# Patient Record
Sex: Female | Born: 1955 | Race: White | Hispanic: No
Health system: Southern US, Academic
[De-identification: ages and names within clinical notes are randomized; demographics above are authoritative.]

## PROBLEM LIST (undated history)

## (undated) ENCOUNTER — Ambulatory Visit

## (undated) ENCOUNTER — Ambulatory Visit: Payer: MEDICARE

## (undated) ENCOUNTER — Encounter

## (undated) ENCOUNTER — Inpatient Hospital Stay

## (undated) ENCOUNTER — Ambulatory Visit: Attending: Pharmacist | Primary: Pharmacist

## (undated) ENCOUNTER — Encounter: Attending: Family | Primary: Family

## (undated) ENCOUNTER — Telehealth: Payer: MEDICARE | Attending: Adult Health | Primary: Adult Health

## (undated) ENCOUNTER — Telehealth

## (undated) ENCOUNTER — Ambulatory Visit: Payer: MEDICARE | Attending: Physician Assistant | Primary: Physician Assistant

## (undated) ENCOUNTER — Encounter: Attending: Adult Health | Primary: Adult Health

## (undated) ENCOUNTER — Telehealth: Attending: Clinical | Primary: Clinical

## (undated) ENCOUNTER — Ambulatory Visit: Payer: MEDICARE | Attending: Adult Health | Primary: Adult Health

## (undated) ENCOUNTER — Ambulatory Visit: Payer: MEDICARE | Attending: Family | Primary: Family

## (undated) ENCOUNTER — Ambulatory Visit
Payer: MEDICARE | Attending: Rehabilitative and Restorative Service Providers" | Primary: Rehabilitative and Restorative Service Providers"

## (undated) ENCOUNTER — Ambulatory Visit: Payer: MEDICARE | Attending: Registered Nurse | Primary: Registered Nurse

## (undated) ENCOUNTER — Telehealth: Attending: Internal Medicine | Primary: Internal Medicine

## (undated) ENCOUNTER — Ambulatory Visit
Attending: Student in an Organized Health Care Education/Training Program | Primary: Student in an Organized Health Care Education/Training Program

## (undated) ENCOUNTER — Encounter: Attending: Nurse Practitioner | Primary: Nurse Practitioner

## (undated) ENCOUNTER — Encounter: Attending: Diagnostic Radiology | Primary: Diagnostic Radiology

## (undated) ENCOUNTER — Ambulatory Visit: Payer: MEDICARE | Attending: Speech-Language Pathologist | Primary: Speech-Language Pathologist

## (undated) ENCOUNTER — Telehealth: Attending: Family | Primary: Family

## (undated) DIAGNOSIS — F32A Depression, unspecified: Secondary | ICD-10-CM

## (undated) DIAGNOSIS — E785 Hyperlipidemia, unspecified: Secondary | ICD-10-CM

## (undated) DIAGNOSIS — F419 Anxiety disorder, unspecified: Secondary | ICD-10-CM

## (undated) DIAGNOSIS — Z87442 Personal history of urinary calculi: Secondary | ICD-10-CM

## (undated) DIAGNOSIS — R0600 Dyspnea, unspecified: Secondary | ICD-10-CM

## (undated) DIAGNOSIS — F4310 Post-traumatic stress disorder, unspecified: Secondary | ICD-10-CM

## (undated) DIAGNOSIS — J45909 Unspecified asthma, uncomplicated: Secondary | ICD-10-CM

## (undated) DIAGNOSIS — G4730 Sleep apnea, unspecified: Secondary | ICD-10-CM

## (undated) DIAGNOSIS — E119 Type 2 diabetes mellitus without complications: Secondary | ICD-10-CM

## (undated) DIAGNOSIS — R0609 Other forms of dyspnea: Secondary | ICD-10-CM

## (undated) DIAGNOSIS — I2510 Atherosclerotic heart disease of native coronary artery without angina pectoris: Secondary | ICD-10-CM

## (undated) DIAGNOSIS — M797 Fibromyalgia: Secondary | ICD-10-CM

## (undated) DIAGNOSIS — I5189 Other ill-defined heart diseases: Secondary | ICD-10-CM

## (undated) DIAGNOSIS — Z9981 Dependence on supplemental oxygen: Secondary | ICD-10-CM

## (undated) DIAGNOSIS — K219 Gastro-esophageal reflux disease without esophagitis: Secondary | ICD-10-CM

## (undated) DIAGNOSIS — I10 Essential (primary) hypertension: Secondary | ICD-10-CM

## (undated) HISTORY — PX: CARDIAC SURGERY: SHX584

## (undated) HISTORY — PX: CHOLECYSTECTOMY: SHX55

## (undated) HISTORY — PX: TONSILLECTOMY: SUR1361

## (undated) HISTORY — PX: ABDOMINAL HYSTERECTOMY: SHX81

## (undated) MED ORDER — TRAMADOL 50 MG TABLET: 0 days

## (undated) MED ORDER — MELOXICAM 15 MG TABLET
0 days
Start: ? — End: 2020-03-29

## (undated) MED ORDER — PREGABALIN 25 MG CAPSULE: Freq: Two times a day (BID) | ORAL | 0 days

## (undated) MED ORDER — PREDNISONE 10 MG TABLETS IN A DOSE PACK: 0 days

---
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
## 1981-10-14 HISTORY — PX: TONSILLECTOMY: SUR1361

## 2001-10-14 HISTORY — PX: MOUTH SURGERY: SHX715

## 2003-10-15 DIAGNOSIS — I219 Acute myocardial infarction, unspecified: Secondary | ICD-10-CM

## 2003-10-15 HISTORY — DX: Acute myocardial infarction, unspecified: I21.9

## 2003-10-15 HISTORY — PX: CARDIAC SURGERY: SHX584

## 2003-10-15 HISTORY — PX: CARDIAC CATHETERIZATION: SHX172

## 2006-10-14 DIAGNOSIS — I219 Acute myocardial infarction, unspecified: Secondary | ICD-10-CM | POA: Insufficient documentation

## 2010-09-15 ENCOUNTER — Observation Stay: Payer: Self-pay | Admitting: Internal Medicine

## 2010-09-15 ENCOUNTER — Ambulatory Visit: Payer: Self-pay | Admitting: Cardiovascular Disease

## 2012-04-07 ENCOUNTER — Ambulatory Visit: Payer: Self-pay

## 2012-08-07 DIAGNOSIS — R079 Chest pain, unspecified: Secondary | ICD-10-CM | POA: Insufficient documentation

## 2014-04-06 ENCOUNTER — Observation Stay: Payer: Self-pay | Admitting: Internal Medicine

## 2014-04-06 DIAGNOSIS — E785 Hyperlipidemia, unspecified: Secondary | ICD-10-CM

## 2014-04-06 DIAGNOSIS — I10 Essential (primary) hypertension: Secondary | ICD-10-CM

## 2014-04-06 DIAGNOSIS — R0789 Other chest pain: Secondary | ICD-10-CM

## 2014-04-06 LAB — BASIC METABOLIC PANEL
Anion Gap: 5 — ABNORMAL LOW (ref 7–16)
BUN: 14 mg/dL (ref 7–18)
CO2: 28 mmol/L (ref 21–32)
CREATININE: 0.67 mg/dL (ref 0.60–1.30)
Calcium, Total: 8.7 mg/dL (ref 8.5–10.1)
Chloride: 106 mmol/L (ref 98–107)
EGFR (African American): >60
GLUCOSE: 90 mg/dL (ref 65–99)
Osmolality: 278 (ref 275–301)
Potassium: 4.5 mmol/L (ref 3.5–5.1)
Sodium: 139 mmol/L (ref 136–145)

## 2014-04-06 LAB — CBC
HCT: 39.9 % (ref 35.0–47.0)
HGB: 12.7 g/dL (ref 12.0–16.0)
MCH: 28.5 pg (ref 26.0–34.0)
MCHC: 31.8 g/dL — ABNORMAL LOW (ref 32.0–36.0)
MCV: 90 fL (ref 80–100)
Platelet: 248 10*3/uL (ref 150–440)
RBC: 4.46 10*6/uL (ref 3.80–5.20)
RDW: 14.3 % (ref 11.5–14.5)
WBC: 7.9 10*3/uL (ref 3.6–11.0)

## 2014-04-06 LAB — URINALYSIS, COMPLETE
BILIRUBIN, UR: NEGATIVE
Bacteria: NONE SEEN
GLUCOSE, UR: NEGATIVE mg/dL (ref 0–75)
KETONE: NEGATIVE
Leukocyte Esterase: NEGATIVE
Nitrite: NEGATIVE
Ph: 6 (ref 4.5–8.0)
Protein: NEGATIVE
Specific Gravity: 1.019 (ref 1.003–1.030)
Squamous Epithelial: 1

## 2014-04-06 LAB — CK TOTAL AND CKMB (NOT AT ARMC)
CK, Total: 30 U/L
CK-MB: 1 ng/mL (ref 0.5–3.6)

## 2014-04-06 LAB — TROPONIN I

## 2014-04-07 LAB — LIPID PANEL
CHOLESTEROL: 216 mg/dL — AB (ref 0–200)
HDL: 31 mg/dL — AB (ref 40–60)
Ldl Cholesterol, Calc: 152 mg/dL — ABNORMAL HIGH (ref 0–100)
Triglycerides: 163 mg/dL (ref 0–200)
VLDL Cholesterol, Calc: 33 mg/dL (ref 5–40)

## 2014-04-07 LAB — CBC WITH DIFFERENTIAL/PLATELET
Basophil #: 0 10*3/uL (ref 0.0–0.1)
Basophil %: 0.6 %
Eosinophil #: 0.3 10*3/uL (ref 0.0–0.7)
Eosinophil %: 4.6 %
HCT: 37.9 % (ref 35.0–47.0)
HGB: 12.4 g/dL (ref 12.0–16.0)
LYMPHS ABS: 2.9 10*3/uL (ref 1.0–3.6)
Lymphocyte %: 38.8 %
MCH: 29 pg (ref 26.0–34.0)
MCHC: 32.6 g/dL (ref 32.0–36.0)
MCV: 89 fL (ref 80–100)
MONO ABS: 0.6 x10 3/mm (ref 0.2–0.9)
MONOS PCT: 7.5 %
NEUTROS ABS: 3.6 10*3/uL (ref 1.4–6.5)
NEUTROS PCT: 48.5 %
PLATELETS: 229 10*3/uL (ref 150–440)
RBC: 4.26 10*6/uL (ref 3.80–5.20)
RDW: 14.6 % — ABNORMAL HIGH (ref 11.5–14.5)
WBC: 7.5 10*3/uL (ref 3.6–11.0)

## 2014-04-07 LAB — CK TOTAL AND CKMB (NOT AT ARMC)
CK, Total: 30 U/L
CK-MB: 1.4 ng/mL (ref 0.5–3.6)

## 2014-04-07 LAB — BASIC METABOLIC PANEL
Anion Gap: 5 — ABNORMAL LOW (ref 7–16)
BUN: 13 mg/dL (ref 7–18)
CHLORIDE: 107 mmol/L (ref 98–107)
CO2: 28 mmol/L (ref 21–32)
Calcium, Total: 8.9 mg/dL (ref 8.5–10.1)
Creatinine: 0.65 mg/dL (ref 0.60–1.30)
EGFR (African American): >60
Glucose: 89 mg/dL (ref 65–99)
Osmolality: 279 (ref 275–301)
POTASSIUM: 4.1 mmol/L (ref 3.5–5.1)
Sodium: 140 mmol/L (ref 136–145)

## 2014-04-07 LAB — TSH: Thyroid Stimulating Horm: 2.74 u[IU]/mL

## 2014-04-07 LAB — TROPONIN I: Troponin-I: <0.02 ng/mL

## 2014-04-08 DIAGNOSIS — R079 Chest pain, unspecified: Secondary | ICD-10-CM

## 2014-08-19 ENCOUNTER — Inpatient Hospital Stay: Payer: Self-pay | Admitting: Surgery

## 2014-08-19 LAB — CBC
HCT: 40 % (ref 35.0–47.0)
HGB: 13.1 g/dL (ref 12.0–16.0)
MCH: 29.4 pg (ref 26.0–34.0)
MCHC: 32.8 g/dL (ref 32.0–36.0)
MCV: 90 fL (ref 80–100)
Platelet: 249 10*3/uL (ref 150–440)
RBC: 4.45 10*6/uL (ref 3.80–5.20)
RDW: 14 % (ref 11.5–14.5)
WBC: 11.3 10*3/uL — ABNORMAL HIGH (ref 3.6–11.0)

## 2014-08-19 LAB — COMPREHENSIVE METABOLIC PANEL
ALT: 30 U/L
ANION GAP: 9 (ref 7–16)
Albumin: 3.2 g/dL — ABNORMAL LOW (ref 3.4–5.0)
Alkaline Phosphatase: 115 U/L
BUN: 11 mg/dL (ref 7–18)
Bilirubin,Total: 0.2 mg/dL (ref 0.2–1.0)
CALCIUM: 8.2 mg/dL — AB (ref 8.5–10.1)
Chloride: 106 mmol/L (ref 98–107)
Co2: 29 mmol/L (ref 21–32)
Creatinine: 0.93 mg/dL (ref 0.60–1.30)
EGFR (African American): >60
Glucose: 100 mg/dL — ABNORMAL HIGH (ref 65–99)
Osmolality: 286 (ref 275–301)
Potassium: 3.9 mmol/L (ref 3.5–5.1)
SGOT(AST): 20 U/L (ref 15–37)
Sodium: 144 mmol/L (ref 136–145)
Total Protein: 7.8 g/dL (ref 6.4–8.2)

## 2014-08-19 LAB — URINALYSIS, COMPLETE
Bilirubin,UR: NEGATIVE
Glucose,UR: NEGATIVE mg/dL (ref 0–75)
KETONE: NEGATIVE
Nitrite: NEGATIVE
PH: 6 (ref 4.5–8.0)
Protein: NEGATIVE
RBC,UR: 14 /HPF (ref 0–5)
Specific Gravity: 1.015 (ref 1.003–1.030)
Squamous Epithelial: 2

## 2014-08-19 LAB — LIPASE, BLOOD: Lipase: 109 U/L (ref 73–393)

## 2014-08-20 LAB — BASIC METABOLIC PANEL
Anion Gap: 6 — ABNORMAL LOW (ref 7–16)
BUN: 11 mg/dL (ref 7–18)
CO2: 29 mmol/L (ref 21–32)
Calcium, Total: 8 mg/dL — ABNORMAL LOW (ref 8.5–10.1)
Chloride: 105 mmol/L (ref 98–107)
Creatinine: 0.85 mg/dL (ref 0.60–1.30)
EGFR (African American): >60
EGFR (Non-African Amer.): >60
Glucose: 133 mg/dL — ABNORMAL HIGH (ref 65–99)
Osmolality: 281 (ref 275–301)
Potassium: 4.2 mmol/L (ref 3.5–5.1)
SODIUM: 140 mmol/L (ref 136–145)

## 2014-08-20 LAB — HEPATIC FUNCTION PANEL A (ARMC)
ALK PHOS: 95 U/L
Albumin: 2.7 g/dL — ABNORMAL LOW (ref 3.4–5.0)
Bilirubin, Direct: <0.1 mg/dL (ref 0.0–0.2)
Bilirubin,Total: 0.3 mg/dL (ref 0.2–1.0)
SGOT(AST): 25 U/L (ref 15–37)
SGPT (ALT): 38 U/L
TOTAL PROTEIN: 6.8 g/dL (ref 6.4–8.2)

## 2014-08-20 LAB — CBC WITH DIFFERENTIAL/PLATELET
BASOS ABS: 0 10*3/uL (ref 0.0–0.1)
Basophil %: 0.1 %
EOS ABS: 0 10*3/uL (ref 0.0–0.7)
Eosinophil %: 0 %
HCT: 35.6 % (ref 35.0–47.0)
HGB: 11.4 g/dL — ABNORMAL LOW (ref 12.0–16.0)
LYMPHS PCT: 12.8 %
Lymphocyte #: 1.6 10*3/uL (ref 1.0–3.6)
MCH: 29.2 pg (ref 26.0–34.0)
MCHC: 32.1 g/dL (ref 32.0–36.0)
MCV: 91 fL (ref 80–100)
MONO ABS: 0.7 x10 3/mm (ref 0.2–0.9)
Monocyte %: 5.5 %
Neutrophil #: 10 10*3/uL — ABNORMAL HIGH (ref 1.4–6.5)
Neutrophil %: 81.6 %
PLATELETS: 249 10*3/uL (ref 150–440)
RBC: 3.91 10*6/uL (ref 3.80–5.20)
RDW: 14.2 % (ref 11.5–14.5)
WBC: 12.3 10*3/uL — AB (ref 3.6–11.0)

## 2014-10-14 HISTORY — PX: CHOLECYSTECTOMY: SHX55

## 2015-01-05 ENCOUNTER — Emergency Department: Payer: Self-pay | Admitting: Student

## 2015-02-04 NOTE — H&P (Signed)
PATIENT NAME:  Sabrina Lucas, Sabrina Lucas MR#:  [id number] DATE OF BIRTH:  10-Jan-1956  DATE OF ADMISSION:  04/06/2014  REFERRING PHYSICIAN: Dr. Ethelda Chick.  CHIEF COMPLAINT: Chest pain.   PRIMARY CARE PHYSICIAN: Dr. Adela Glimpse.  HISTORY OF PRESENT ILLNESS: This is a very nice 59 year old female who has history of coronary artery disease with previous MI in 2008 requiring a stent. Also the patient has a history of hypertension, hyperlipidemia, mild obesity, fibromyalgia, PTSD, anxiety, and recently has been told that her cholesterol is increasing. The last one was up to 186, for which she has been really concerned. The patient comes today with a history of chest pain that started at the store whenever she was walking around. She was not pushing a cart. She was just walking on flat. Prior to that, she was engaging in more activity. She was in [employer] and she went upstairs, 2 flights of stairs, and did not have any significant chest pain or shortness of breath. Whenever she had the chest pain and the shortness of breath in the store, it happened all at once. It was around 2:00 p.m. Happened on walking, location on the left side of the sternum, mostly in the middle. Lasted for 20 minutes, of severe intensity at 10 out of 10, and then it started relieving. By the time that she received nitroglycerin here in the ER, the pain went away. Alleviating factors: Nitroglycerin. Worsening factors: Activity, as it happened as she was walking. Radiation: None. Intensity of the pain: 10 out of 10, associated with nausea and diaphoresis. The patient states she got really sweaty during the event. At the same time she was having her heart pounding and having significant palpitations, both fast and hard. The patient states that she does not have chest pain on a regular basis. The last time she had some type of chest pain, it was musculoskeletal and it was after she stopped her Neurontin. After she started taking her Neurontin, the pain went  away. The patient is admitted for evaluation of acute coronary syndrome versus chest pain of a different origin.   REVIEW OF SYSTEMS:    CONSTITUTIONAL: No fever, fatigue, weight loss or weight gain.  EYES: No blurry vision.  EARS, NOSE, THROAT: No difficulty swallowing or tinnitus.  RESPIRATORY: Shortness of breath today but not previous. No cough, no wheezing.  CARDIOVASCULAR: Chest pain as mentioned above. No orthopnea. No edema. No arrhythmias. No syncope. Positive palpitations today.  GASTROINTESTINAL: No nausea, vomiting, abdominal pain, constipation, diarrhea.  GENITOURINARY: No dysuria or hematuria. Positive mild incontinence, for which she takes oxybutynin.  GYNECOLOGIC: No breast masses.  ENDOCRINE: No polyuria, polydipsia, polyphagia.  HEMATOLOGIC AND LYMPHATIC: No anemia, easy bruising or bleeding.  SKIN: No rashes or petechiae.  MUSCULOSKELETAL: No significant neck pain. Positive mild back pain. Positive fibromyalgia.  NEUROLOGIC: No numbness, tingling, CVAs or TIAs. No significant headaches. The patient had a headache from nitroglycerin that has improved.  PSYCHIATRIC: No insomnia or depression.   PAST MEDICAL HISTORY: 1.  Hypertension.  2.  Coronary artery disease.  3.  MI in 2008, status post stent placement.  4.  Hyperlipidemia.  5.  Depression.  6.  Fibromyalgia.  7.  PTSD.  8.  Anxiety.  9.  Mild urine incontinence.   ALLERGIES: IODINE.   SURGICAL HISTORY:  1.  Stent placement.  2.  Oral surgery.  3.  Tubal pregnancy, status post oophorectomy and salpingectomy.  4.  Tonsillectomy.   FAMILY HISTORY: Positive for hypertension, diabetes in her  mom, MI in her dad at the age of 39 who had also CABG and died from complications of emphysema. No cancer in the family.   SOCIAL HISTORY: The patient used to smoke. She quit 2 months ago. She drinks rarely. Prior to that, she smoked 1 pack per day for over 20 years. 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 She is working on her disability.   PHYSICAL EXAMINATION: VITAL SIGNS: Blood pressure 136/81, pulse 58, respirations 20, temperature 97.8, oxygen saturation 96% on room air.  GENERAL: The patient is alert, oriented x3, in no acute distress. No respiratory distress. Hemodynamically stable.  HEENT: Pupils are equal and reactive. Extraocular movements are intact. Mucosae are moist. Anicteric sclerae. Pink conjunctivae. No lesions.  NECK: Supple. No JVD. No thyromegaly. No adenopathy. No carotid bruits. No rigidity.  CARDIOVASCULAR: Regular rate and rhythm. No murmurs, rubs or gallops. No displacement of PMI. No tenderness to palpation of anterior chest wall.  LUNGS: Clear without any wheezing or crepitus. No use of accessory muscles.  ABDOMEN: Soft, nontender, nondistended. No hepatosplenomegaly. No masses. Bowel sounds are positive.  GENITAL: Deferred.  EXTREMITIES: No edema, cyanosis or clubbing.  MUSCULOSKELETAL: No significant joint effusions or joint swelling.  VASCULAR: Capillary refill less than 3. Pulses +2.  NEUROLOGIC: Cranial nerves II through XII intact. Strength is 5 out of 5 in all 4 extremities. No focal findings.  PSYCHIATRIC: No agitation. The patient is alert, oriented x 3.  LYMPHATIC: Negative for lymphadenopathy in neck or supraclavicular areas.  SKIN: No rashes or petechiae evident at this moment.  MUSCULOSKELETAL: No joint deformity.  LABORATORY DATA: Glucose is 90, creatinine 0.67, electrolytes within normal limits. Troponin is 0.02. White count 7.9, hemoglobin 12.7, platelet count 248. Urinalysis: 10 red blood cells, 2 white blood cells.   EKG: No ST depression or elevation. No significant abnormalities.   ASSESSMENT AND PLAN: This is a 59 year old female with known coronary artery disease, who comes with chest pain after being free of cardiac symptoms for over 5 years.  1.  Chest pain with some typical characteristics: The patient with her history, risk factors  of smoking (although she quit 2 months ago), hypertension, obesity, and previous coronary artery disease is going to be admitted for evaluation and observation overnight. The patient is going to have cycle of cardiac enzymes every 4 hours to evaluate the possibility of non-ST elevation myocardial infarction or acute coronary syndrome. The patient is also going to be taking aspirin, beta blocker (which she is already taking). Start her on a statin, as her cholesterol has been elevated. Check her cholesterol levels in the morning. The patient was not taking a statin prior to that. Start her on nitroglycerin, morphine, and oxygen as needed. Monitor under telemetry. At this moment, the patient is asymptomatic, but she states that her pain relieved after nitroglycerin. Cardiology consultation depending on the results of the tests. Echocardiogram depending on the results of the stress test as well.  2.  Hypertension: Continue treatment with metoprolol and losartan. At this moment, it seems like her blood pressure is stable.  3.  Fibromyalgia: Continue treatment with gabapentin.  4.  Posttraumatic stress disorder and anxiety: Continue SSRIs.  5.  Deep vein thrombosis prophylaxis with Lovenox prophylactic dose.  6.  Gastrointestinal prophylaxis with Protonix. Also need to evaluate the possibility of patient having reflux.   Other than that, the patient is stable. She used to be a smoker, but no longer for  the past 2 months.   I spent about 45 minutes with this patient.   The patient is a full code.    ____________________________ Felipa Furnaceoberto Sanchez Gutierrez, MD rsg:jcm D: 04/06/2014 17:28:28 ET T: 04/06/2014 18:03:43 ET JOB#: [id number]  cc: Felipa Furnaceoberto Sanchez Gutierrez, MD, <Dictator> ROBERTO Juanda ChanceSANCHEZ GUTIERRE MD ELECTRONICALLY SIGNED 04/08/2014 15:15

## 2015-02-04 NOTE — Discharge Summary (Signed)
PATIENT NAME:  Sabrina Lucas, Sabrina Lucas MR#:  [id number] DATE OF BIRTH:  1956-09-07  DATE OF ADMISSION:  04/06/2014 DATE OF DISCHARGE:  04/08/2014  ADMITTING PHYSICIAN: Dr. Felipa Furnaceoberto Sanchez Gutierrez   DISCHARGING PHYSICIAN: Dr. Enid Baasadhika Lennard Capek  PRIMARY CARE PHYSICIAN: Nonlocal.  CONSULTATIONS IN THE HOSPITAL: Cardiology with Dr. Kirke CorinArida.   DISCHARGE DIAGNOSES: 1.  Chest pain. Could be musculoskeletal versus angina, status post cardiac catheterization with patent LAD stent and nonobstructive coronaries.  2.  Coronary artery disease, status post LAD stent.  3.  Hypertension.  4.  Hyperlipidemia.  5.  Depression.  6.  Fibromyalgia. 7.  Posttraumatic stress disorder.  8.  Anxiety.  9.  Urinary incontinence.   DISCHARGE HOME MEDICATIONS: 1.  Metoprolol 50 mg p.o. daily.  2.  Losartan 100 mg p.o. daily.  3.  Albuterol nebulizer q. 6 hours p.r.n.  4.  Fluoxetine 40 mg p.o. daily.  5.  Oxybutynin 5 mg p.o. b.i.d.  6.  Meclizine 25 mg q. 6 to 8 hours p.r.n.  7.  Omeprazole 20 mg p.o. daily.  8.  Gabapentin 300 mg p.o. at bedtime.  9.  Zyrtec 10 mg p.o. daily.  10.  Aspirin 81 mg p.o. daily.  11.  Vitamin D3 1 capsule p.o. daily. 12.  Atorvastatin 20 mg p.o. at bedtime.   DISCHARGE DIET: Low-sodium.  DISCHARGE ACTIVITY: As tolerated.   FOLLOWUP INSTRUCTIONS: PCP followup in 1 week.   BRIEF HOSPITAL COURSE: For more details, look at the discharge summary dictated by Dr. Nemiah CommanderKalisetti on April 07, 2014. The patient was admitted for chest pain, thought to be musculoskeletal. However, stress test done showed abnormality with lesion of moderate ischemia in anterolateral territory, so she was seen by cardiologist, Dr. Kirke CorinArida, and had cardiac catheterization done on 26th June 2015 which showed patent distal LAD with no significant restenosis, no evidence of any occlusive coronary artery disease, so was deemed to be a false-positive scan. The patient's blood pressure was elevated the first day, however, that  improved without any medication changes. The patient is being discharged home in stable condition.   DISCHARGE CONDITION: Stable.   DISCHARGE DISPOSITION: Home.   TIME SPENT ON DISCHARGE: 35 minutes. ____________________________ Enid Baasadhika Anthonette Lesage, MD rk:sb D: 04/08/2014 13:25:08 ET T: 04/08/2014 14:12:40 ET JOB#: [id number]  cc: Enid Baasadhika Jamicheal Heard, MD, <Dictator> Enid BaasADHIKA Adilynn Bessey MD ELECTRONICALLY SIGNED 04/21/2014 10:33

## 2015-02-04 NOTE — Consult Note (Signed)
Brief Consult Note: Diagnosis: chest pain possible unstable angina. abnormal stress test.   Patient was seen by consultant.   Consult note dictated.   Comments: cardiac cath tomorrow. Will prep for dye allergy.  Electronic Signatures: Lorine BearsArida, Muhammad (MD)  (Signed 25-Jun-15 17:19)  Authored: Brief Consult Note   Last Updated: 25-Jun-15 17:19 by Lorine BearsArida, Muhammad (MD)

## 2015-02-04 NOTE — Op Note (Signed)
PATIENT NAME:  Sabrina Lucas, Sabrina Lucas MR#:  [id number] DATE OF BIRTH:  12-15-1955  DATE OF PROCEDURE:  08/19/2014  PREOPERATIVE DIAGNOSIS: Acute cholecystitis.   POSTOPERATIVE DIAGNOSIS: Acute cholecystitis.   PROCEDURE: Laparoscopic cholecystectomy.   SURGEON: Sabrina Orealph L. Ely III, MD.   ANESTHESIA: General.   OPERATIVE PROCEDURE: With the patient in the supine position after the induction of appropriate general anesthesia, the patient's abdomen was prepped with ChloraPrep and draped with sterile towels. The patient was placed in the head down, feet up position. A small infraumbilical incision was made in the standard fashion, carried down bluntly through the subcutaneous tissue. A Veress needle was used to cannulate the peritoneal cavity. CO2 was insufflated to appropriate pressure measurements. When approximately 200 liters of CO2 were instilled, the Veress needle was withdrawn. An 11 mm [employer] medical port was inserted into the peritoneal cavity. Intraperitoneal position was confirmed and CO2 was reinsufflated. The patient was placed in the head up, feet down position, rotated slightly to the left side. A subxiphoid transverse incision was made and an 11 mm port was inserted under direct vision. Two lateral ports 5 mm in size were inserted under direct vision. The gallbladder was quite distended and attempts to grasp it were unsuccessful. It was then aspirated of approximately 45 mL of white bile. The gallbladder was then more easily retracted exposing the hepatoduodenal ligament. The cystic artery and cystic duct were identified. The cystic duct was quite big. I made a small opening in the duct and again mucoid white bile was exposed. I could not identify any cystic duct stones. I attempted to milk some stones proximally. I went ahead and tried to insert a cholangiogram catheter but could not get past the obstruction in the cystic duct. Once again, I attempted to milk stones out of the proximal cystic duct but  could not accomplish any removal of impacted cystic duct stones. Because of the large size of the cystic duct, I elected to divide it with the Endo GIA stapling device. We changed the 11 mm site subxiphoid port for a 12 mm port using a blue load, divided the cystic duct with an Endo GIA stapler. The cystic artery was identified, doubly clipped and divided. The gallbladder was then dissected free from its bed and the liver using the hook and cautery apparatus. A small rent was made in the posterior wall of the gallbladder and several stones were spilled. They were all collected with a stone scoop. The gallbladder was then placed in an Endo Catch apparatus and removed through the subxiphoid incision. The area was copiously irrigated with 2 liters of warm saline solution. I did not see any other stones in the abdomen. The bed of the liver was drained with a 19 [place] Blake drain inserting it through the subxiphoid incision bringing it out through one of the lateral incisions. Sutures were placed with 3-0 nylon. The subxiphoid incision was closed with figure-of-eight suture of 0 Vicryl. The area was infiltrated with 0.25% Marcaine for postoperative pain control. The abdomen was desufflated. Skin incisions were closed with 5-0 nylon. Sterile dressings were applied. The patient was returned to the recovery room, having tolerated the procedure well. Sponge, instrument and needle counts were correct x 2 in the Operating Room.    ____________________________ Sabrina Endalph L. Ely III, MD rle:at D: 08/19/2014 14:06:50 ET T: 08/19/2014 15:22:31 ET JOB#: [id number]  cc: Sabrina Endalph L. Ely III, MD, <Dictator> [facility] Sabrina OreALPH L ELY MD ELECTRONICALLY SIGNED 08/24/2014 13:47

## 2015-02-04 NOTE — Discharge Summary (Signed)
PATIENT NAME:  Sabrina Lucas, Sabrina Lucas MR#:  [id number] DATE OF BIRTH:  [birth year]  DATE OF ADMISSION:  08/19/2014 DATE OF DISCHARGE:  08/21/2014  BRIEF HISTORY: Sabrina BroodDeborah Skilton is a 59 year old woman admitted through the Emergency Room with signs and symptoms consistent with acute cholecystitis. Work-up confirmed the likely diagnosis. In view of her persistent symptoms, she was taken urgently to surgery that afternoon where she underwent a laparoscopic cholecystectomy. She had a large obstructed cystic duct. She had evidence for hydrops of her gallbladder. The procedure was uncomplicated. Drain was placed. She had some mild respiratory problems postoperatively and some issues with pain control but is markedly improved this morning. The wounds look good. The drain was removed. There are no significant problems. She is discharged home today to be followed in the office in 7 to 10 days' time. Bathing, activity, and driving instructions were given to the patient.   DISCHARGE MEDICATIONS: Include metoprolol 50 mg once a day, losartan 100 mg once a day, albuterol 2.5 inhaled solution every 6 hours p.r.n., fluoxetine 40 mg once a day, oxybutynin 5 mg b.i.d., meclizine 25 mg every 8 hours p.r.n., omeprazole 20 mg once a day, gabapentin 300 mg once a day at bedtime, Zyrtec 10 mg once a day, aspirin 81 mg once a day, vitamin D3 one capsule once a day, and Lipitor 20 mg once a day. She is discharged home on Vicodin 5/300 mg for pain.   FINAL DISCHARGE DIAGNOSIS: Acute cholecystitis.   SURGERY: Laparoscopic cholecystectomy.   ____________________________ Quentin Orealph L. Ely III, MD rle:jh D: 08/21/2014 08:11:48 ET T: 08/21/2014 11:14:52 ET JOB#: [id number]  cc: Carmie Endalph L. Ely III, MD, <Dictator> Quentin OreALPH L ELY MD ELECTRONICALLY SIGNED 08/24/2014 13:47

## 2015-02-04 NOTE — Discharge Summary (Signed)
PATIENT NAME:  Sabrina Lucas, Kassondra MR#:  [id number] DATE OF BIRTH:  1956-02-15  DATE OF ADMISSION:  04/06/2014 DATE OF DISCHARGE:  04/07/2014  ADMITTING PHYSICIAN: Dr. Mordecai MaesSanchez.   DISCHARGING PHYSICIAN: Enid Baasadhika Kalisetti, M.D.   PRIMARY CARE PHYSICIAN: None local.   CONSULTATIONS IN THE HOSPITAL: None.   DISCHARGE DIAGNOSES:  1.  Chest pain, likely musculoskeletal.  2.  Coronary artery disease, status post stent in 2008.  3.  Hypertension.  4.  Hyperlipidemia.  5.  Depression.  6.  Fibromyalgia.  7.  Posttraumatic stress disorder.  8.  Anxiety.  9.  Mild urinary incontinence.   DISCHARGE HOME MEDICATIONS:  1.  Metoprolol 50 mg p.o. daily.  2.  Losartan 100 mg p.o. daily.  3.  Albuterol nebulizer q. 6 hours p.r.n.  4.  Fluoxetine 40 mg p.o. daily.  5.  Oxybutynin 5 mg p.o. b.i.d.  6.  Meclizine 25 mg q. 6-8 hours as needed.  7.  Omeprazole 20 mg p.o. daily.  8.  Gabapentin 300 mg p.o. at bedtime. 9.   Zyrtec 10 mg p.o. daily.  10.  Aspirin 81 mg p.o. daily.  11.  Vitamin D3 one capsule p.o. daily.  12.  Atorvastatin 200 mg p.o. at bedtime.  DISCHARGE DIET:  Low sodium.  DISCHARGE ACTIVITY: As tolerated.   FOLLOWUP INSTRUCTIONS: PCP follow-up in one week.   LABORATORY DATA AND IMAGING STUDIES PRIOR TO DISCHARGE: WBC 7.5, hemoglobin 12.20, hematocrit 37.9, platelet count 229,000.   Sodium 140, potassium 4.1, chloride 107, bicarbonate 28, BUN 13, creatinine 0.6, blood glucose 89, and calcium of 8.9.  LDL cholesterol 152, HDL is 31, total cholesterol 216, and triglycerides 163. TSH is 2.74, troponins x 3 are negative. Chest x-ray is clear without any acute abnormalities and UA is negative for any infection.   BRIEF HOSPITAL COURSE: Sabrina Lucas is a 59 year old Caucasian female with a history of hypertension, coronary artery disease, status post prior stents, and family history of heart disease admitted to the hospital secondary to chest pain.   1.  Chest pain.  Presents with typical  chest pain and states it is similar to her prior chest pain when she had the stent put in. However, she was monitored on telemetry.  Her troponins were negative. Because of her risk factors, she had a Myoview done.  Stress part of the stress test is negative at this time. Still waiting for the final report.  If the Myoview is negative,  patient will be discharged home.  She is currently chest pain-free at this time.  Her main problem is headache due to elevated blood pressure for which she is be treated. Could be musculoskeletal. She was advised to have a close outpatient follow up later.   2.  Hypertension. She is on metoprolol and losartan and is being discharged on the same.   3.  Fibromyalgia, posttraumatic stress disorder, and depression. The patient is being discharged on her home medications without any changes.   4.  Coronary artery disease. The patient is on aspirin, metoprolol, and losartan.  A statin has been added as her cholesterol has been elevated.  5.  Her course has been uneventful in the hospital.   DISCHARGE CONDITION: Stable.   DISCHARGE DISPOSITION: Home.   TIME SPENT ON DISCHARGE: 40 minutes.    ____________________________ Enid Baasadhika Kalisetti, MD rk:ts D: 04/07/2014 15:17:18 ET T: 04/07/2014 19:03:43 ET JOB#: [id number]  cc: Enid Baasadhika Kalisetti, MD, <Dictator> Enid BaasADHIKA KALISETTI MD ELECTRONICALLY SIGNED 04/21/2014 10:33

## 2015-02-04 NOTE — Consult Note (Signed)
PATIENT NAME:  Sabrina Lucas, Sabrina Lucas MR#:  [id number] DATE OF BIRTH:  [birth year]  DATE OF CONSULTATION:  04/06/2014  REQUESTING PHYSICIAN:  Dr. Nemiah Commander CONSULTING PHYSICIAN:  Jerolyn Center A. Kirke Corin, MD  REASON FOR CONSULTATION: Chest pain and abnormal stress test.   HISTORY OF PRESENT ILLNESS: This is a 59 year old female with known history of coronary artery disease with previous myocardial infarction in 2008 status post stenting in [place], hypertension, hyperlipidemia, obesity, fibromyalgia, PTSD, and anxiety. She presented with a prolonged episode of substernal chest tightness with no radiation which started while she was shopping. It lasted for about 20 minutes and was severe in intensity. It resolved after she was given nitroglycerin in the Emergency Room. She reports that the discomfort was very similar to her previous myocardial infarction. She has noticed increased exertional dyspnea. Cardiac enzymes have been negative. ECG showed no acute changes. She underwent a nuclear stress test today which showed evidence of anterolateral ischemia. However, the study was suboptimal due to intense GI uptake.   PAST MEDICAL HISTORY:  1. Hypertension.  2. Coronary artery disease as outlined above.  3. Hyperlipidemia.  4. Depression.  5. Fibromyalgia.  6. PTSD.  7. Anxiety.   ALLERGIES: IODINE AND IV DYE.   PAST SURGICAL HISTORY:  Tonsillectomy, tubal pregnancy surgery, and oral surgery.   FAMILY HISTORY: Family history is remarkable for coronary artery disease.   SOCIAL HISTORY: She quit smoking about 2 months ago, but she is using electronic cigarettes now. She used cocaine in the past but none recently.   REVIEW OF SYSTEMS:  A 10-point review of systems was performed. It is negative other than what is mentioned in the HPI.   PHYSICAL EXAMINATION:  GENERAL: The patient appears to be at her stated age in no acute distress.  VITAL SIGNS: Temperature 97.8, pulse 62, respiratory rate 20, blood pressure  129/68, and oxygen saturation is 93% on room air.  HEENT: Normocephalic, atraumatic.  NECK: No JVD or carotid bruits.  RESPIRATORY: Normal respiratory effort with no use of accessory muscles. Auscultation reveals normal breath sounds.  CARDIOVASCULAR: Normal PMI. Normal S1 and S2 with no gallops or murmurs.  ABDOMEN: Benign, nontender, and nondistended.  EXTREMITIES: No clubbing, cyanosis, or edema.  SKIN: Warm and dry with no rash.  PSYCHIATRIC: She is alert, oriented x 3 with normal mood and affect.   LABORATORY AND DIAGNOSTIC DATA: Labs showed normal renal function. Cardiac enzymes were negative. CBC was unremarkable. EKG showed sinus bradycardia with LVH and no significant ST or T wave changes.   IMPRESSION:  1. Chest pain, possible unstable angina.  2. Known history of coronary artery disease with previous myocardial infarction and stenting in 2008.  3. Hypertension.  4. Hyperlipidemia.  5. Previous tobacco use.   RECOMMENDATIONS: The patient presented with an episode of chest pain that was very similar to her previous myocardial infarction. She underwent a nuclear stress test which was suggestive of moderate size anterolateral ischemia. However, the stress test was overall suboptimal in quality due to intense GI activity. The possibility of false positive stress testing cannot be excluded. I discussed with her different management options, including a continued close observation versus proceeding with cardiac catheterization. I discussed the risks and benefits of cardiac catheterization extensively and the patient wants to proceed. I scheduled catheterization for tomorrow. She reports allergy to IV dye and thus she will be premedicated with prednisone and Benadryl. Further recommendations to follow after cardiac catheterization.  ____________________________ Chelsea Aus Kirke Corin, MD maa:dd D: 04/07/2014 17:25:55 ET  T: 04/07/2014 18:10:40 ET JOB#: [id number]  cc: Jerolyn CenterMuhammad A. Kirke CorinArida, MD,  <Dictator> [place] OuchMUHAMMAD A ARIDA MD ELECTRONICALLY SIGNED 04/15/2014 10:23

## 2015-02-04 NOTE — H&P (Signed)
PATIENT NAME:  Sabrina Lucas, Sabrina Lucas MR#:  [id number] DATE OF BIRTH:  Jan 11, 1956  DATE OF ADMISSION:  08/19/2014  PRIMARY CARE PHYSICIAN: [facility]estine [facility]  ADMITTING PHYSICIAN: Tiney Rougealph Ely, MD  CHIEF COMPLAINT: Abdominal pain.   BRIEF HISTORY: Sabrina BroodDeborah Crumpacker is a 59 year old woman seen in the Emergency Room with a months history of almost daily intermittent abdominal discomfort. The pain usually comes on in the evening with midepigastric, bilateral upper quadrant pain radiating to the right side, back and shoulder. Pain is often associated with significant nausea and vomiting. It does not appear to be related to anything in particular that she eats. She has had this problem for a number of months but it has worsened over the last month and then became particularly severe in the last 24 hours. She presented to the Emergency Room for further evaluation. She was recently hospitalized during the last several months with a similar episode felt to be related to her previous coronary artery disease. She underwent cardiac catheterization, which did not demonstrate any new acute disease and she was felt to have stable coronary artery disease. It is likely that her previous symptoms were related to her biliary tract. She denies any other significant GI problems. She denies a history of hepatitis, yellow jaundice, pancreatitis, peptic ulcer disease, previous diagnosis of gallbladder disease or diverticulitis. Only previous abdominal surgery has been tubal pregnancy on the left side with salpingectomy and oophorectomy. She has a history of coronary artery disease with previous stent placement. She is no longer taking any anticoagulation other than aspirin.    CURRENT MEDICATIONS: Include aspirin 81 mg once a day, Lipitor 20 mg once a day, fluoxetine 40 mg once a day, gabapentin 300 mg once a day, losartan 100 mg once a day, meclizine 25 mg every 8 hours, metoprolol 50 mg once a day.   ALLERGIES: She is allergic to IVP DYE AND  IODINE.   FAMILY HISTORY: Noncontributory.  REVIEW OF SYSTEMS: Otherwise unremarkable. Specifically, she denies any chest pain.    PHYSICAL EXAMINATION: GENERAL: She is an alert, relatively comfortable woman in no significant distress.  VITAL SIGNS: Blood pressure is 118/60 and heart rate 72 and regular. She is afebrile.  HEENT: No scleral icterus. No pupillary abnormalities. No facial deformities.  NECK: Supple and nontender with midline trachea.  CHEST: Clear with no adventitious sounds. She has normal pulmonary excursion.  CARDIAC: No murmurs or gallops to my ear and she seems to be in normal sinus rhythm.  ABDOMEN: Soft, some mild right upper quadrant tenderness. I cannot palpate her gallbladder. She does have some mild guarding in that area. She has active bowel sounds.  EXTREMITIES: Lower extremities reveal full range of motion, no deformities.  PSYCHIATRIC: Normal orientation, normal affect.   ASSESSMENT AND PLAN: In this setting, with her ultrasound results demonstrating evidence for acute cholecystitis and her clinical history, I would anticipate that biliary tract is the source of her problems. She does have significant gallbladder wall thickening, positive Murphy sign and cholelithiasis. We will move to surgical intervention later this morning as outlined to her in detail. Risks, benefits, and options have been outlined and accepted.  ____________________________ Carmie Endalph L. Ely III, MD rle:sb D: 08/19/2014 07:11:23 ET T: 08/19/2014 07:44:07 ET JOB#: [id number]  cc: Carmie Endalph L. Ely III, MD, <Dictator> [facility] Quentin OreALPH L ELY MD ELECTRONICALLY SIGNED 08/24/2014 13:47

## 2015-02-06 LAB — SURGICAL PATHOLOGY

## 2017-08-27 ENCOUNTER — Emergency Department
Admission: EM | Admit: 2017-08-27 | Discharge: 2017-08-27 | Disposition: A | Discharge status: Home / Self Care | Payer: MEDICAID | Source: Intra-hospital

## 2017-08-27 ENCOUNTER — Emergency Department
Admission: EM | Admit: 2017-08-27 | Discharge: 2017-08-27 | Disposition: A | Discharge status: Home / Self Care | Payer: MEDICAID | Source: Intra-hospital | Admitting: Emergency Medicine

## 2017-08-27 DIAGNOSIS — R202 Paresthesia of skin: Secondary | ICD-10-CM

## 2017-08-27 DIAGNOSIS — M25511 Pain in right shoulder: Principal | ICD-10-CM

## 2017-08-27 DIAGNOSIS — R51 Headache: Secondary | ICD-10-CM

## 2018-01-04 ENCOUNTER — Ambulatory Visit: Admit: 2018-01-04 | Discharge: 2018-01-04 | Discharge status: Against Medical Advice | Payer: MEDICARE

## 2018-02-27 ENCOUNTER — Ambulatory Visit
Admit: 2018-02-27 | Discharge: 2018-02-27 | Disposition: A | Discharge status: Home / Self Care | Payer: MEDICARE | Attending: Emergency Medicine

## 2018-02-27 ENCOUNTER — Emergency Department
Admit: 2018-02-27 | Discharge: 2018-02-27 | Disposition: A | Discharge status: Home / Self Care | Payer: MEDICARE | Attending: Emergency Medicine

## 2018-02-27 DIAGNOSIS — R61 Generalized hyperhidrosis: Principal | ICD-10-CM

## 2018-07-28 DIAGNOSIS — M79604 Pain in right leg: Secondary | ICD-10-CM

## 2018-07-28 DIAGNOSIS — M79605 Pain in left leg: Secondary | ICD-10-CM

## 2018-07-28 DIAGNOSIS — G4730 Sleep apnea, unspecified: Secondary | ICD-10-CM

## 2018-07-28 DIAGNOSIS — R079 Chest pain, unspecified: Secondary | ICD-10-CM

## 2018-07-28 DIAGNOSIS — I2510 Atherosclerotic heart disease of native coronary artery without angina pectoris: Principal | ICD-10-CM

## 2018-07-28 MED ORDER — ATORVASTATIN 40 MG TABLET
ORAL_TABLET | Freq: Every day | ORAL | 6 refills | 0.00000 days | Status: CP
Start: 2018-07-28 — End: 2018-07-30

## 2018-07-30 ENCOUNTER — Ambulatory Visit: Admit: 2018-07-30 | Discharge: 2018-07-31 | Payer: MEDICARE | Attending: Family | Primary: Family

## 2018-07-30 DIAGNOSIS — F329 Major depressive disorder, single episode, unspecified: Secondary | ICD-10-CM | POA: Insufficient documentation

## 2018-07-30 DIAGNOSIS — I519 Heart disease, unspecified: Secondary | ICD-10-CM | POA: Insufficient documentation

## 2018-07-30 DIAGNOSIS — I10 Essential (primary) hypertension: Secondary | ICD-10-CM | POA: Insufficient documentation

## 2018-07-30 DIAGNOSIS — J45909 Unspecified asthma, uncomplicated: Secondary | ICD-10-CM | POA: Insufficient documentation

## 2018-07-30 DIAGNOSIS — F410 Panic disorder [episodic paroxysmal anxiety] without agoraphobia: Secondary | ICD-10-CM | POA: Insufficient documentation

## 2018-07-30 DIAGNOSIS — F32A Depression, unspecified: Secondary | ICD-10-CM | POA: Insufficient documentation

## 2018-07-30 DIAGNOSIS — F419 Anxiety disorder, unspecified: Secondary | ICD-10-CM

## 2018-07-30 DIAGNOSIS — Z1231 Encounter for screening mammogram for malignant neoplasm of breast: Secondary | ICD-10-CM

## 2018-07-30 DIAGNOSIS — Z0000 Encounter for general adult medical examination without abnormal findings: Secondary | ICD-10-CM

## 2018-07-30 DIAGNOSIS — F4310 Post-traumatic stress disorder, unspecified: Secondary | ICD-10-CM

## 2018-07-30 DIAGNOSIS — K219 Gastro-esophageal reflux disease without esophagitis: Secondary | ICD-10-CM

## 2018-07-30 DIAGNOSIS — M797 Fibromyalgia: Secondary | ICD-10-CM | POA: Insufficient documentation

## 2018-07-30 MED ORDER — METOPROLOL SUCCINATE ER 50 MG TABLET,EXTENDED RELEASE 24 HR
ORAL_TABLET | Freq: Every day | ORAL | 3 refills | 0 days | Status: CP
Start: 2018-07-30 — End: 2018-08-13

## 2018-07-30 MED ORDER — HYDROXYZINE HCL 25 MG TABLET
ORAL_TABLET | Freq: Four times a day (QID) | ORAL | 3 refills | 0.00000 days | Status: CP | PRN
Start: 2018-07-30 — End: 2018-10-01

## 2018-07-30 MED ORDER — OMEPRAZOLE 20 MG CAPSULE,DELAYED RELEASE
ORAL_CAPSULE | Freq: Two times a day (BID) | ORAL | 3 refills | 0 days | Status: CP
Start: 2018-07-30 — End: 2019-03-29

## 2018-07-30 MED ORDER — ATORVASTATIN 40 MG TABLET
ORAL_TABLET | Freq: Every day | ORAL | 6 refills | 0 days | Status: CP
Start: 2018-07-30 — End: 2018-10-01

## 2018-07-30 MED ORDER — CITALOPRAM 40 MG TABLET
ORAL_TABLET | Freq: Every day | ORAL | 3 refills | 0.00000 days | Status: CP
Start: 2018-07-30 — End: 2018-11-20

## 2018-07-31 ENCOUNTER — Ambulatory Visit: Admit: 2018-07-31 | Discharge: 2018-08-01 | Payer: MEDICARE

## 2018-07-31 DIAGNOSIS — M79604 Pain in right leg: Principal | ICD-10-CM

## 2018-07-31 DIAGNOSIS — M79605 Pain in left leg: Secondary | ICD-10-CM

## 2018-08-13 ENCOUNTER — Ambulatory Visit: Admit: 2018-08-13 | Discharge: 2018-08-14 | Payer: MEDICARE | Attending: Family | Primary: Family

## 2018-08-13 DIAGNOSIS — N3941 Urge incontinence: Secondary | ICD-10-CM | POA: Insufficient documentation

## 2018-08-13 DIAGNOSIS — I10 Essential (primary) hypertension: Principal | ICD-10-CM

## 2018-08-13 DIAGNOSIS — Z0000 Encounter for general adult medical examination without abnormal findings: Secondary | ICD-10-CM

## 2018-08-13 DIAGNOSIS — R319 Hematuria, unspecified: Secondary | ICD-10-CM

## 2018-08-13 DIAGNOSIS — R252 Cramp and spasm: Secondary | ICD-10-CM

## 2018-08-13 DIAGNOSIS — I519 Heart disease, unspecified: Secondary | ICD-10-CM

## 2018-08-13 MED ORDER — BACLOFEN 5 MG TABLET
ORAL_TABLET | Freq: Three times a day (TID) | ORAL | 3 refills | 0 days | Status: CP
Start: 2018-08-13 — End: 2018-08-18

## 2018-08-13 MED ORDER — METOPROLOL SUCCINATE ER 50 MG TABLET,EXTENDED RELEASE 24 HR
ORAL_TABLET | Freq: Two times a day (BID) | ORAL | 3 refills | 0 days | Status: CP
Start: 2018-08-13 — End: 2018-09-24

## 2018-08-15 ENCOUNTER — Ambulatory Visit: Admit: 2018-08-15 | Discharge: 2018-08-15 | Disposition: A | Discharge status: Home / Self Care | Payer: MEDICARE

## 2018-08-16 ENCOUNTER — Ambulatory Visit
Admission: EM | Admit: 2018-08-16 | Discharge: 2018-08-16 | Disposition: A | Discharge status: Home / Self Care | Payer: Medicare Other

## 2018-08-16 DIAGNOSIS — S46912A Strain of unspecified muscle, fascia and tendon at shoulder and upper arm level, left arm, initial encounter: Secondary | ICD-10-CM | POA: Diagnosis not present

## 2018-08-16 DIAGNOSIS — G43019 Migraine without aura, intractable, without status migrainosus: Secondary | ICD-10-CM | POA: Diagnosis not present

## 2018-08-16 DIAGNOSIS — I10 Essential (primary) hypertension: Secondary | ICD-10-CM

## 2018-08-16 MED ORDER — KETOROLAC TROMETHAMINE 60 MG/2ML IM SOLN
60.0000 mg | Freq: Once | INTRAMUSCULAR | Status: AC
  Administered 2018-08-16: 60 mg via INTRAMUSCULAR

## 2018-08-16 MED ORDER — ORPHENADRINE CITRATE ER 100 MG PO TB12: 100.0000 mg | ORAL_TABLET | Freq: Two times a day (BID) | ORAL | 0 refills | Status: AC | PRN

## 2018-08-16 NOTE — ED Provider Notes (Signed)
[facility]    CSN: [id number] Arrival date & time: 08/16/18  1239     History   Chief Complaint Chief Complaint  Patient presents with  . Shoulder Pain    HPI Sabrina Lucas is a 62 y.o. female.   62 year old female presents with left shoulder pain and migraine that started 2 days ago. Thinks she "slept wrong" on her left shoulder and woke up with left shoulder pain yesterday. She tried Ibuprofen and Baclofen with minimal relief. The shoulder pain was getting so intense it was causing a migraine headache. She went to [facility] ER yesterday and was given Toradol, Reglan and Benadryl injection for her headache and was told to continue Baclofen. She had an ECG to rule out cardiac cause of her shoulder pain. She is unable to take Flexeril due to interaction with Celexa. Other chronic health issues include HTN, hyperlipidemia, anxiety/depression, and GERD and currently on Toprol XL 100mg , Lipitor, Celexa, Prilosec and Atarax prn.   The history is provided by the patient.    History reviewed. No pertinent past medical history.  Patient Active Problem List   Diagnosis Date Noted  . Urge incontinence 08/13/2018  . Anxiety 07/30/2018  . Asthma 07/30/2018  . Depression 07/30/2018  . Fibromyalgia 07/30/2018  . Heart disease 07/30/2018  . Panic disorder 07/30/2018  . Post traumatic stress disorder (PTSD) 07/30/2018  . Hypertension 07/30/2018  . Chest pain 08/07/2012  . Myocardial infarction (HCC) 10/14/2006    Past Surgical History:  Procedure Laterality Date  . ABDOMINAL HYSTERECTOMY     partial  . CARDIAC SURGERY    . CHOLECYSTECTOMY    . TONSILLECTOMY      OB History   None      Home Medications    Prior to Admission medications   Medication Sig Start Date End Date Taking? Authorizing Provider  atorvastatin (LIPITOR) 40 MG tablet  07/28/18  Yes [provider]  Baclofen 5 MG TABS Take by mouth. 08/13/18  Yes [provider]    citalopram (CELEXA) 40 MG tablet  07/30/18  Yes [provider]  hydrOXYzine (ATARAX/VISTARIL) 25 MG tablet  07/30/18  Yes [provider]  metoprolol succinate (TOPROL-XL) 100 MG 24 hr tablet 100 mg.  07/30/18  Yes [provider]  omeprazole (PRILOSEC) 20 MG capsule  07/30/18  Yes [provider]  orphenadrine (NORFLEX) 100 MG tablet Take 1 tablet (100 mg total) by mouth 2 (two) times daily as needed for muscle spasms. 08/16/18   Sudie Grumbling, NP    Family History Family History  Problem Relation Age of Onset  . Diabetes Mother   . Heart disease Father     Social History Social History   Tobacco Use  . Smoking status: Current Every Day Smoker  . Smokeless tobacco: Never Used  Substance Use Topics  . Alcohol use: Never    Frequency: Never  . Drug use: Not on file     Allergies   Iodine and Sulfa antibiotics   Review of Systems Review of Systems  Constitutional: Negative for activity change, appetite change, chills, diaphoresis, fatigue and fever.  HENT: Negative for congestion, ear discharge, ear pain, facial swelling, nosebleeds, postnasal drip, rhinorrhea, sinus pressure, sinus pain, sneezing, sore throat and trouble swallowing.   Eyes: Negative for photophobia, pain, discharge, redness, itching and visual disturbance.  Respiratory: Negative for cough, chest tightness, shortness of breath and wheezing.   Cardiovascular: Negative for chest pain and palpitations.  Gastrointestinal:  Negative for abdominal pain, nausea and vomiting.  Musculoskeletal: Positive for arthralgias, myalgias and neck pain. Negative for back pain, joint swelling and neck stiffness.  Skin: Negative for color change, rash and wound.  Neurological: Positive for headaches. Negative for dizziness, tremors, seizures, syncope, facial asymmetry, speech difficulty, weakness, light-headedness and numbness.  Hematological: Negative for adenopathy. Does not bruise/bleed  easily.  Psychiatric/Behavioral: Negative for self-injury and suicidal ideas.     Physical Exam Triage Vital Signs ED Triage Vitals  Enc Vitals Group     BP 08/16/18 1321 (!) 188/112     Pulse Rate 08/16/18 1321 69     Resp 08/16/18 1321 18     Temp 08/16/18 1321 98.2 F (36.8 C)     Temp Source 08/16/18 1321 Oral     SpO2 08/16/18 1321 96 %     Weight --      Height --      Head Circumference --      Peak Flow --      Pain Score 08/16/18 1324 9     Pain Loc --      Pain Edu? --      Excl. in GC? --    No data found.  Updated Vital Signs BP (!) 188/112 (BP Location: Right Arm)   Pulse 69   Temp 98.2 F (36.8 C) (Oral)   Resp 18   SpO2 96%   Visual Acuity Right Eye Distance:   Left Eye Distance:   Bilateral Distance:    Right Eye Near:   Left Eye Near:    Bilateral Near:     Physical Exam  Constitutional: She is oriented to person, place, and time. She appears well-developed and well-nourished. She is cooperative. She appears ill. No distress.  Patient sitting comfortably in exam chair in no acute distress but appears in pain.   HENT:  Head: Normocephalic and atraumatic.  Right Ear: Hearing, tympanic membrane, external ear and ear canal normal.  Left Ear: Hearing, tympanic membrane, external ear and ear canal normal.  Nose: Nose normal. Right sinus exhibits no maxillary sinus tenderness and no frontal sinus tenderness. Left sinus exhibits no maxillary sinus tenderness and no frontal sinus tenderness.  Mouth/Throat: Uvula is midline, oropharynx is clear and moist and mucous membranes are normal.  Headache is "entire head"  Eyes: Pupils are equal, round, and reactive to light. Conjunctivae and EOM are normal.  Neck: Trachea normal. Neck supple. Muscular tenderness present. No neck rigidity. Decreased range of motion present.    Decreased range of motion of neck, especially with rotation to the right. Tender along Paraspinous and trapezius muscle groups. No  redness, swelling or rash. No distinct neuro deficits noted.   Cardiovascular: Normal rate, regular rhythm and normal heart sounds.  No murmur heard. Pulmonary/Chest: Effort normal and breath sounds normal. No respiratory distress. She has no decreased breath sounds. She has no wheezes. She has no rhonchi. She has no rales.  Musculoskeletal: She exhibits tenderness.       Thoracic back: She exhibits decreased range of motion, tenderness, pain and spasm. She exhibits no swelling and no edema.       Back:  Decreased range of motion of left shoulder, especially with abduction and rotation. Pain mainly in thoracic shoulder blade area. Slightly tender to palpation. Muscle spasms present. No distinct neuro deficits noted. No numbness or decreased sensation in left hand/fingers. Good distal pulses and capillary refill.   Lymphadenopathy:    She has no cervical adenopathy.  Neurological: She is alert and oriented to person, place, and time. She has normal strength and normal reflexes. No cranial nerve deficit or sensory deficit.  Skin: Skin is warm and dry. Capillary refill takes less than 2 seconds. No rash noted. No erythema.  Psychiatric: She has a normal mood and affect. Her behavior is normal. Judgment and thought content normal.  Vitals reviewed.    UC Treatments / Results  Labs (all labs ordered are listed, but only abnormal results are displayed) Labs Reviewed - No data to display  EKG None  Radiology No results found.  Procedures Procedures (including critical care time)  Medications Ordered in UC Medications  ketorolac (TORADOL) injection 60 mg (60 mg Intramuscular Given 08/16/18 1418)    Initial Impression / Assessment and Plan / UC Course  I have reviewed the triage vital signs and the nursing notes.  Pertinent labs & imaging results that were available during my care of the patient were reviewed by me and considered in my medical decision making (see chart for  details).    Discussed with patient that she still appears to have a muscle strain and spasms. Will give Toradol 60mg  IM now to help with headache and left shoulder pain. Offered other oral NSAIDs to help with symptoms but patient declined. Will trial Norflex 100mg  every 12 hours as needed for muscle spasms instead of Baclofen. Continue to apply warm compresses to area for comfort. Continue to monitor blood pressure- keep appointment with Cardiologist in 5 days as planned. Follow-up with her PCP in 2 to 3 days for recheck if headache and left shoulder pain do not improve.  Final Clinical Impressions(s) / UC Diagnoses   Final diagnoses:  Left shoulder strain, initial encounter  Intractable migraine without aura and without status migrainosus  Elevated blood pressure reading with diagnosis of hypertension     Discharge Instructions     You were given a shot of Toradol 60mg  today to help with pain and inflammation. Recommend switch from Baclofen to Norflex 100mg  twice a day as needed for muscle spasms/pain. May continue to apply heat to area for comfort. Follow-up with your PCP in the next 2 to 3 days if not improving. Also Continue to monitor your blood pressure- keep appointment in 5 days (Friday) with your Cardiologist as planned.     ED Prescriptions    Medication Sig Dispense Auth. Provider   orphenadrine (NORFLEX) 100 MG tablet Take 1 tablet (100 mg total) by mouth 2 (two) times daily as needed for muscle spasms. 20 tablet Sudie Grumbling, NP     Controlled Substance Prescriptions [place] Controlled Substance Registry consulted? Not Applicable   Sudie Grumbling, NP 08/17/18 1133

## 2018-08-16 NOTE — Discharge Instructions (Addendum)
You were given a shot of Toradol 60mg  today to help with pain and inflammation. Recommend switch from Baclofen to Norflex 100mg  twice a day as needed for muscle spasms/pain. May continue to apply heat to area for comfort. Follow-up with your PCP in the next 2 to 3 days if not improving. Also Continue to monitor your blood pressure- keep appointment in 5 days (Friday) with your Cardiologist as planned.

## 2018-08-16 NOTE — ED Triage Notes (Signed)
Pt was at [facility] ER last night and was treated for left shoulder pain that caused migraine. States she is taking baclofen but it isn't helping the knot in her left shoulder. She did take her bp medication today but bp is still really high.

## 2018-08-18 ENCOUNTER — Ambulatory Visit: Admit: 2018-08-18 | Discharge: 2018-08-19 | Payer: MEDICARE | Attending: Family | Primary: Family

## 2018-08-18 DIAGNOSIS — I10 Essential (primary) hypertension: Principal | ICD-10-CM

## 2018-08-18 DIAGNOSIS — M62830 Muscle spasm of back: Secondary | ICD-10-CM

## 2018-08-18 MED ORDER — ORPHENADRINE CITRATE ER 100 MG TABLET,EXTENDED RELEASE
ORAL_TABLET | Freq: Two times a day (BID) | ORAL | 1 refills | 0.00000 days | Status: CP
Start: 2018-08-18 — End: 2018-09-24

## 2018-08-18 MED ORDER — LOSARTAN 50 MG TABLET
ORAL_TABLET | Freq: Every day | ORAL | 3 refills | 0.00000 days | Status: CP
Start: 2018-08-18 — End: 2018-09-24

## 2018-08-20 ENCOUNTER — Ambulatory Visit: Admit: 2018-08-20 | Discharge: 2018-08-20 | Disposition: A | Discharge status: Home / Self Care | Payer: MEDICARE

## 2018-08-20 MED ORDER — DIAZEPAM 5 MG TABLET
ORAL_TABLET | Freq: Every evening | ORAL | 0 refills | 0 days | Status: CP | PRN
Start: 2018-08-20 — End: 2018-09-22

## 2018-08-20 MED ORDER — PREDNISONE 50 MG TABLET
ORAL_TABLET | Freq: Every day | ORAL | 0 refills | 0.00000 days | Status: CP
Start: 2018-08-20 — End: 2018-09-24

## 2018-08-28 ENCOUNTER — Ambulatory Visit: Admit: 2018-08-28 | Discharge: 2018-08-29 | Payer: MEDICARE

## 2018-08-28 DIAGNOSIS — G4730 Sleep apnea, unspecified: Secondary | ICD-10-CM

## 2018-08-28 DIAGNOSIS — I2510 Atherosclerotic heart disease of native coronary artery without angina pectoris: Principal | ICD-10-CM

## 2018-09-14 ENCOUNTER — Ambulatory Visit: Admit: 2018-09-14 | Discharge: 2018-09-15 | Payer: MEDICARE

## 2018-09-17 ENCOUNTER — Ambulatory Visit: Admit: 2018-09-17 | Discharge: 2018-10-16 | Payer: MEDICARE

## 2018-09-17 ENCOUNTER — Ambulatory Visit: Admit: 2018-09-17 | Discharge: 2018-09-30 | Payer: MEDICARE

## 2018-09-17 DIAGNOSIS — I2510 Atherosclerotic heart disease of native coronary artery without angina pectoris: Principal | ICD-10-CM

## 2018-09-17 DIAGNOSIS — R079 Chest pain, unspecified: Principal | ICD-10-CM

## 2018-09-22 ENCOUNTER — Ambulatory Visit
Admit: 2018-09-22 | Discharge: 2018-09-23 | Payer: MEDICARE | Attending: Nurse Practitioner | Primary: Nurse Practitioner

## 2018-09-22 DIAGNOSIS — R319 Hematuria, unspecified: Principal | ICD-10-CM

## 2018-09-22 DIAGNOSIS — N3941 Urge incontinence: Secondary | ICD-10-CM

## 2018-09-24 ENCOUNTER — Ambulatory Visit: Admit: 2018-09-24 | Discharge: 2018-09-25 | Payer: MEDICARE | Attending: Adult Health | Primary: Adult Health

## 2018-09-24 DIAGNOSIS — I10 Essential (primary) hypertension: Secondary | ICD-10-CM

## 2018-09-24 DIAGNOSIS — G4730 Sleep apnea, unspecified: Secondary | ICD-10-CM

## 2018-09-24 DIAGNOSIS — I2510 Atherosclerotic heart disease of native coronary artery without angina pectoris: Principal | ICD-10-CM

## 2018-09-24 DIAGNOSIS — E785 Hyperlipidemia, unspecified: Secondary | ICD-10-CM

## 2018-09-24 DIAGNOSIS — Z720 Tobacco use: Secondary | ICD-10-CM

## 2018-09-24 MED ORDER — LOSARTAN 100 MG TABLET
ORAL_TABLET | Freq: Every day | ORAL | 3 refills | 0 days | Status: CP
Start: 2018-09-24 — End: 2019-09-24

## 2018-09-24 MED ORDER — AMLODIPINE 10 MG TABLET
ORAL_TABLET | Freq: Every day | ORAL | 3 refills | 0.00000 days | Status: CP
Start: 2018-09-24 — End: ?

## 2018-09-24 MED ORDER — METOPROLOL SUCCINATE ER 100 MG TABLET,EXTENDED RELEASE 24 HR
ORAL_TABLET | Freq: Two times a day (BID) | ORAL | 3 refills | 0.00000 days | Status: CP
Start: 2018-09-24 — End: ?

## 2018-09-25 MED ORDER — ORPHENADRINE CITRATE ER 100 MG TABLET,EXTENDED RELEASE
ORAL_TABLET | Freq: Two times a day (BID) | ORAL | 1 refills | 0.00000 days | Status: CP
Start: 2018-09-25 — End: 2018-10-01

## 2018-09-28 ENCOUNTER — Ambulatory Visit: Admit: 2018-09-28 | Discharge: 2018-09-29 | Payer: MEDICARE

## 2018-09-28 DIAGNOSIS — R319 Hematuria, unspecified: Principal | ICD-10-CM

## 2018-09-29 ENCOUNTER — Ambulatory Visit: Admit: 2018-09-29 | Discharge: 2018-09-30 | Payer: MEDICARE

## 2018-09-29 ENCOUNTER — Ambulatory Visit: Admit: 2018-09-29 | Discharge: 2018-09-30 | Payer: MEDICARE | Attending: Urology | Primary: Urology

## 2018-09-29 DIAGNOSIS — R319 Hematuria, unspecified: Principal | ICD-10-CM

## 2018-10-01 ENCOUNTER — Ambulatory Visit: Admit: 2018-10-01 | Discharge: 2018-10-02 | Payer: MEDICARE | Attending: Family | Primary: Family

## 2018-10-01 DIAGNOSIS — F419 Anxiety disorder, unspecified: Principal | ICD-10-CM

## 2018-10-01 DIAGNOSIS — I10 Essential (primary) hypertension: Secondary | ICD-10-CM

## 2018-10-01 DIAGNOSIS — I519 Heart disease, unspecified: Secondary | ICD-10-CM

## 2018-10-01 DIAGNOSIS — Z0000 Encounter for general adult medical examination without abnormal findings: Secondary | ICD-10-CM

## 2018-10-01 DIAGNOSIS — M62830 Muscle spasm of back: Secondary | ICD-10-CM

## 2018-10-01 DIAGNOSIS — M6289 Other specified disorders of muscle: Secondary | ICD-10-CM

## 2018-10-01 MED ORDER — HYDROXYZINE HCL 25 MG TABLET
ORAL_TABLET | Freq: Four times a day (QID) | ORAL | 3 refills | 0.00000 days | Status: CP | PRN
Start: 2018-10-01 — End: 2019-06-01

## 2018-10-01 MED ORDER — ORPHENADRINE CITRATE ER 100 MG TABLET,EXTENDED RELEASE
ORAL_TABLET | Freq: Two times a day (BID) | ORAL | 3 refills | 0 days | Status: SS
Start: 2018-10-01 — End: 2018-11-18

## 2018-10-01 MED ORDER — ATORVASTATIN 40 MG TABLET
ORAL_TABLET | Freq: Every day | ORAL | 6 refills | 0 days | Status: SS
Start: 2018-10-01 — End: 2018-11-18

## 2018-10-14 DIAGNOSIS — K859 Acute pancreatitis without necrosis or infection, unspecified: Secondary | ICD-10-CM

## 2018-10-14 HISTORY — DX: Acute pancreatitis without necrosis or infection, unspecified: K85.90

## 2018-10-20 ENCOUNTER — Ambulatory Visit: Admit: 2018-10-20 | Discharge: 2018-10-21 | Disposition: A | Discharge status: Home / Self Care | Payer: MEDICARE

## 2018-10-20 ENCOUNTER — Emergency Department: Admit: 2018-10-20 | Discharge: 2018-10-21 | Disposition: A | Discharge status: Home / Self Care | Payer: MEDICARE

## 2018-10-20 DIAGNOSIS — R109 Unspecified abdominal pain: Principal | ICD-10-CM

## 2018-10-20 MED ORDER — DICYCLOMINE 20 MG TABLET
ORAL_TABLET | Freq: Three times a day (TID) | ORAL | 0 refills | 0 days | Status: CP | PRN
Start: 2018-10-20 — End: 2018-11-11

## 2018-10-20 MED ORDER — NITROFURANTOIN MONOHYDRATE/MACROCRYSTALS 100 MG CAPSULE
ORAL_CAPSULE | Freq: Two times a day (BID) | ORAL | 0 refills | 0.00000 days | Status: CP
Start: 2018-10-20 — End: 2018-10-27

## 2018-10-20 MED ORDER — PROMETHAZINE 25 MG TABLET
ORAL_TABLET | Freq: Four times a day (QID) | ORAL | 0 refills | 0 days | Status: CP | PRN
Start: 2018-10-20 — End: 2018-11-11

## 2018-10-23 ENCOUNTER — Ambulatory Visit: Admit: 2018-10-23 | Discharge: 2018-10-24 | Payer: MEDICARE | Attending: Family | Primary: Family

## 2018-10-23 DIAGNOSIS — N3090 Cystitis, unspecified without hematuria: Principal | ICD-10-CM

## 2018-10-27 MED ORDER — NITROFURANTOIN MONOHYDRATE/MACROCRYSTALS 100 MG CAPSULE
ORAL_CAPSULE | Freq: Two times a day (BID) | ORAL | 0 refills | 0.00000 days | Status: CP
Start: 2018-10-27 — End: 2018-11-11

## 2018-11-11 ENCOUNTER — Encounter
Admit: 2018-11-11 | Discharge: 2018-11-18 | Disposition: A | Discharge status: Home / Self Care | Payer: MEDICARE | Attending: Nurse Practitioner

## 2018-11-11 ENCOUNTER — Ambulatory Visit: Admit: 2018-11-11 | Discharge: 2018-11-18 | Disposition: A | Discharge status: Home / Self Care | Payer: MEDICARE

## 2018-11-11 DIAGNOSIS — R112 Nausea with vomiting, unspecified: Principal | ICD-10-CM

## 2018-11-12 DIAGNOSIS — R112 Nausea with vomiting, unspecified: Principal | ICD-10-CM

## 2018-11-14 DIAGNOSIS — R112 Nausea with vomiting, unspecified: Principal | ICD-10-CM

## 2018-11-15 DIAGNOSIS — R112 Nausea with vomiting, unspecified: Principal | ICD-10-CM

## 2018-11-18 MED ORDER — ATORVASTATIN 40 MG TABLET
ORAL_TABLET | Freq: Every day | ORAL | 6 refills | 0.00000 days | Status: CP
Start: 2018-11-18 — End: 2018-11-24

## 2018-11-18 MED ORDER — NICOTINE 14 MG/24 HR DAILY TRANSDERMAL PATCH
MEDICATED_PATCH | TRANSDERMAL | 2 refills | 0 days | Status: CP
Start: 2018-11-18 — End: 2018-11-20

## 2018-11-18 MED ORDER — PROMETHAZINE 12.5 MG TABLET
ORAL_TABLET | Freq: Four times a day (QID) | ORAL | 1 refills | 0 days | Status: CP | PRN
Start: 2018-11-18 — End: 2018-11-24
  Filled 2018-11-18: qty 15, 4d supply, fill #0

## 2018-11-18 MED ORDER — OXYCODONE 5 MG TABLET
ORAL_TABLET | 0 refills | 0 days | Status: CP
Start: 2018-11-18 — End: 2019-02-25
  Filled 2018-11-18: qty 20, 5d supply, fill #0

## 2018-11-18 MED FILL — PROMETHAZINE 12.5 MG TABLET: 4 days supply | Qty: 15 | Fill #0 | Status: AC

## 2018-11-18 MED FILL — OXYCODONE 5 MG TABLET: 5 days supply | Qty: 20 | Fill #0 | Status: AC

## 2018-11-20 MED ORDER — CITALOPRAM 40 MG TABLET
ORAL_TABLET | Freq: Every day | ORAL | 3 refills | 0 days | Status: CP
Start: 2018-11-20 — End: ?

## 2018-11-20 MED ORDER — NICOTINE 14 MG/24 HR DAILY TRANSDERMAL PATCH
MEDICATED_PATCH | TRANSDERMAL | 2 refills | 0 days | Status: CP
Start: 2018-11-20 — End: 2019-06-01

## 2018-11-24 ENCOUNTER — Ambulatory Visit: Admit: 2018-11-24 | Discharge: 2018-11-25 | Payer: MEDICARE | Attending: Family | Primary: Family

## 2018-11-24 DIAGNOSIS — K851 Biliary acute pancreatitis without necrosis or infection: Principal | ICD-10-CM

## 2018-11-24 DIAGNOSIS — F4310 Post-traumatic stress disorder, unspecified: Secondary | ICD-10-CM

## 2018-11-24 DIAGNOSIS — R112 Nausea with vomiting, unspecified: Secondary | ICD-10-CM

## 2018-11-24 DIAGNOSIS — I10 Essential (primary) hypertension: Secondary | ICD-10-CM

## 2018-11-24 MED ORDER — PROMETHAZINE 12.5 MG TABLET
ORAL_TABLET | Freq: Four times a day (QID) | ORAL | 1 refills | 0 days | Status: CP | PRN
Start: 2018-11-24 — End: 2018-12-01

## 2018-12-10 ENCOUNTER — Other Ambulatory Visit: Admit: 2018-12-10 | Discharge: 2018-12-11 | Payer: MEDICARE

## 2018-12-10 DIAGNOSIS — I2510 Atherosclerotic heart disease of native coronary artery without angina pectoris: Secondary | ICD-10-CM

## 2018-12-10 DIAGNOSIS — I10 Essential (primary) hypertension: Principal | ICD-10-CM

## 2018-12-22 DIAGNOSIS — K219 Gastro-esophageal reflux disease without esophagitis: Principal | ICD-10-CM

## 2019-02-24 ENCOUNTER — Other Ambulatory Visit: Admit: 2019-02-24 | Discharge: 2019-02-25 | Payer: MEDICARE

## 2019-02-24 DIAGNOSIS — K851 Biliary acute pancreatitis without necrosis or infection: Principal | ICD-10-CM

## 2019-02-25 ENCOUNTER — Telehealth: Admit: 2019-02-25 | Discharge: 2019-02-26 | Payer: MEDICARE | Attending: Family | Primary: Family

## 2019-02-25 DIAGNOSIS — K858 Other acute pancreatitis without necrosis or infection: Principal | ICD-10-CM

## 2019-02-25 MED ORDER — TRAMADOL 50 MG TABLET
ORAL_TABLET | Freq: Four times a day (QID) | ORAL | 0 refills | 0 days | Status: CP
Start: 2019-02-25 — End: ?

## 2019-02-25 MED ORDER — ORPHENADRINE CITRATE ER 100 MG TABLET,EXTENDED RELEASE
ORAL_TABLET | Freq: Two times a day (BID) | ORAL | 0 refills | 0.00000 days | Status: CP | PRN
Start: 2019-02-25 — End: 2019-04-15

## 2019-02-25 MED ORDER — PROMETHAZINE 12.5 MG TABLET
ORAL_TABLET | Freq: Four times a day (QID) | ORAL | 0 refills | 0 days | Status: CP | PRN
Start: 2019-02-25 — End: 2019-04-15

## 2019-03-17 MED ORDER — CHLORTHALIDONE 25 MG TABLET
ORAL_TABLET | Freq: Every day | ORAL | 6 refills | 0 days | Status: CP
Start: 2019-03-17 — End: 2019-05-08
  Filled 2019-04-19: qty 30, 30d supply, fill #0

## 2019-03-22 MED ORDER — ATORVASTATIN 40 MG TABLET
ORAL_TABLET | Freq: Every day | ORAL | 3 refills | 0 days | Status: CP
Start: 2019-03-22 — End: ?
  Filled 2019-04-19: qty 90, 90d supply, fill #0

## 2019-03-29 MED ORDER — OMEPRAZOLE 20 MG CAPSULE,DELAYED RELEASE
ORAL_CAPSULE | Freq: Two times a day (BID) | ORAL | 1 refills | 0 days | Status: CP
Start: 2019-03-29 — End: 2020-03-28
  Filled 2019-04-19: qty 180, 90d supply, fill #0

## 2019-04-09 ENCOUNTER — Ambulatory Visit: Admit: 2019-04-09 | Discharge: 2019-04-10 | Payer: MEDICARE

## 2019-04-09 DIAGNOSIS — I10 Essential (primary) hypertension: Secondary | ICD-10-CM

## 2019-04-09 DIAGNOSIS — E785 Hyperlipidemia, unspecified: Principal | ICD-10-CM

## 2019-04-13 ENCOUNTER — Institutional Professional Consult (permissible substitution): Admit: 2019-04-13 | Discharge: 2019-04-14 | Payer: MEDICARE | Attending: Adult Health | Primary: Adult Health

## 2019-04-13 DIAGNOSIS — I2510 Atherosclerotic heart disease of native coronary artery without angina pectoris: Principal | ICD-10-CM

## 2019-04-13 DIAGNOSIS — Z720 Tobacco use: Secondary | ICD-10-CM

## 2019-04-13 DIAGNOSIS — I10 Essential (primary) hypertension: Secondary | ICD-10-CM

## 2019-04-13 DIAGNOSIS — E785 Hyperlipidemia, unspecified: Secondary | ICD-10-CM

## 2019-04-13 MED ORDER — REPATHA SYRINGE 140 MG/ML SUBCUTANEOUS SYRINGE
SUBCUTANEOUS | 3 refills | 0.00000 days | Status: CP
Start: 2019-04-13 — End: ?
  Filled 2019-04-19: qty 6, 84d supply, fill #0

## 2019-04-13 NOTE — Unmapped (Signed)
Patient cardiology appt confirmed and encounter started. We tried video chat but patient states it would not allow her to open the link so changed to phone visit.

## 2019-04-13 NOTE — Unmapped (Signed)
I spent 17 minutes on the audio/video with the patient. I spent an additional 20 minutes on pre- and post-visit activities.     The patient was physically located in [place] or a state in which I am permitted to provide care. The patient and/or parent/gauardian understood that s/he may incur co-pays and cost sharing, and agreed to the telemedicine visit. The visit was completed via phone and/or video, which was appropriate and reasonable under the circumstances given the patient's presentation at the time.    The patient and/or parent/guardian has been advised of the potential risks and limitations of this mode of treatment (including, but not limited to, the absence of in-person examination) and has agreed to be treated using telemedicine. The patient's/patient's family's questions regarding telemedicine have been answered.     If the phone/video visit was completed in an ambulatory setting, the patient and/or parent/guardian has also been advised to contact their provider???s office for worsening conditions, and seek emergency medical treatment and/or call 911 if the patient deems either necessary.    Assessment/Plan   1. Coronary artery disease involving native coronary artery of native heart without angina pectoris  With hx of NSTEMI and PCI to distal LAD (2008) at [facility]. Recent nuclear stress test negative for ischemia and echo overal normal. She is having no exertional symptoms, has started exercising  - continue on ASA, metoprolol succinate 100 mg bid, atorvastatin 40 mg    2. Essential hypertension  On losartan 100 mg, Toprol 100 bid, amlodipine 10 mg daily. Chlorthalidone 25 mg recently started. BMP stable.    Home BP readings are now well controlled, <130/80    3. Tobacco abuse  Currently smokes 0.75 ppd. Has quit in the past but restarts due to stress. I counseled her on the importance of quitting. Recently unable to enroll for [facility] Treatment Program due to unclear reasons but is willing to try again. Will have them contact her.     4. Sleep apnea, unspecified type  Patient with suspected sleep apnea. She was unable to undergo a sleep study due to COVID-19 restrictions. She will call and reschedule.      5. HLD  Now back on atorva 40, tolerating w/ some mild myalgias. LDL 183>>114  Will pursue [id number] (Repatha) given that ezetimibe therapy is unlikely to reduce LDL <70 - avg reduction 17%.  She will get lipids rechecked after 2-3 doses.     Lab Results   Component Value Date    LDL 114.9 (H) 04/09/2019       Follow up: Return in about 6 months (around 10/13/2019).    Subjective:   EAV:Marie Quinn D Marie Dear, NP  Chief complaint:  63 y.o. female with history of CAD, tobacco abuse, hypertension, hyperlipidemia, pancreatitis, anxiety/depression, and panic disorder who presents for phone follow up visit due to ongoing covid pandemic      History of Present Illness:  Patient has reported history of MI in 2008 or 2009 at [facility] but did not follow up with cardiology for some time. Established care with Dr. Julio Alm in October 2019 complaining of daily chest pain and anxiety. She underwent nuclear stress test that was negative for ischemia and echo showed overall normal LV systolic function with no significant structural disease. Previously did not tolerate simvastatin and started on atorvastatin with mild myalgias which she has seemed to tolerate. We have been working to optimize HTN control. She was also referred for sleep study, but is not yet scheduled. Was last  seen 4 wks ago for telehealth at which time chlorthalidone was added.      Reports home BP readings are typically 120/60 while on chlorthalidone. Recent BMP stable.     Today, she reports that she is doing well. Reports her swelling has improved significantly. However, reports feeling significantly fatigued recently. Denies chest pain, chest discomfort, or SOB symptoms. She continues to dance and walks for exercise, feels she is more active. No reported issues with activity. She has tried calling the sleep clinic today, but was unable to connect with them and plans to call again.     Reports she continues on her atorvastatin, but continues to experience ongoing myalgias, although tolerable. Statin hate factor 7/10. Upon discussion of adding on repatha or Zetia for improved cholesterol control, we made the shared decision to pursue repatha given that Zetia would likely not reduce her cholesterol enough.    Explains she experiences high levels of stress and anxiety due to her cardiac conditions. Reports she used to meet with a therapist but has been unable to do so recently due to COVID-19. She has downloaded a mindfulness app which she found to be somewhat helpful, but has not used it regularly.     Continues to smoke 3/4 ppd. She is still interested in quitting smoking and is open to having the Tobacco Treatment Program call her.     Past Medical History  Patient Active Problem List   Diagnosis   ??? 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 Hypertension   ??? Myocardial infarction (CMS-HCC)   ??? Mild intermittent asthma without complication   ??? Anxiety   ??? Depression   ??? Post traumatic stress disorder (PTSD)   ??? Fibromyalgia   ??? Panic disorder   ??? Chest pain   ??? Urge incontinence   ??? Back spasm   ??? Dyslipidemia   ??? Epigastric pain   ??? Nausea & vomiting   ??? Elevated liver enzymes   ??? Pancreatitis   ??? Tobacco abuse       Medications:  Current Outpatient Medications   Medication Sig Dispense Refill   ??? ALBUTEROL, BULK, MISC Frequency:null   Dosage:3   gm  Instructions:0.002 = gm 3 mL, inhalation, q6h, for wheezing, # 25 EA, 0, Print Requisition  Note:     ??? amLODIPine (NORVASC) 10 MG tablet Take 1 tablet (10 mg total) by mouth daily. 90 tablet 3   ??? aspirin (ECOTRIN) 81 MG tablet Take 81 mg by mouth daily.     ??? atorvastatin (LIPITOR) 40 MG tablet Take 1 tablet (40 mg total) by mouth daily. 90 tablet 3   ??? chlorthalidone (HYGROTON) 25 MG tablet Take 1 tablet (25 mg total) by mouth daily. 30 tablet 6   ??? citalopram (CELEXA) 40 MG tablet Take 1 tablet (40 mg total) by mouth daily. 90 tablet 3   ??? hydrOXYzine (ATARAX) 25 MG tablet Take 1 tablet (25 mg total) by mouth every six (6) hours as needed. for anxiety and congestion. (Patient taking differently: Take 25 mg by mouth two (2) times a day. for anxiety and congestion.) 60 tablet 3   ??? losartan (COZAAR) 100 MG tablet Take 1 tablet (100 mg total) by mouth daily. 90 tablet 3   ??? metoprolol succinate (TOPROL-XL) 100 MG 24 hr tablet Take 1 tablet (100 mg total) by mouth two (2) times a day. Repeat for elevated BP. 180 tablet 3   ??? omeprazole (  PRILOSEC) 20 MG capsule Take 1 capsule (20 mg total) by mouth two (2) times a day. 180 capsule 1   ??? orphenadrine (NORFLEX) 100 mg tablet Take 1 tablet (100 mg total) by mouth two (2) times a day as needed for muscle spasms. 20 tablet 0   ??? promethazine (PHENERGAN) 12.5 MG tablet Take 1 tablet (12.5 mg total) by mouth every six (6) hours as needed for nausea. 20 tablet 0   ??? traMADoL (ULTRAM) 50 mg tablet Take 1-1.5 tablets (50-75 mg total) by mouth Every six (6) hours. As needed for pain 20 tablet 0   ??? evolocumab (REPATHA SYRINGE) 140 mg/mL Syrg Inject 140 mg under the skin every fourteen (14) days. 6 Syringe 3   ??? nicotine (NICODERM CQ) 14 mg/24 hr patch Place 1 patch on the skin daily. (Patient not taking: Reported on 03/17/2019) 28 patch 2     No current facility-administered medications for this visit.        Allergies  Allergies   Allergen Reactions   ??? Iodine And Iodide Containing Products Anaphylaxis   ??? Statins-Hmg-Coa Reductase Inhibitors      Side effects   ??? Sulfa (Sulfonamide Antibiotics)        Social History:   Social History     Tobacco Use   ??? Smoking status: Current Every Day Smoker     Packs/day: 0.75     Types: Cigarettes   ??? Smokeless tobacco: Never Used   ??? Tobacco comment: smokes 0.5-0.75 ppd   Substance Use Topics   ??? Alcohol use: Not Currently     Frequency: Monthly or less   ??? Drug use: Not Currently     Types: Cocaine     Comment: not used cocaine in 13 years       Family History:  Family History   Problem Relation Age of Onset   ??? Heart disease Father    ??? Coronary artery disease Father         quadruple bypass   ??? Mental illness Father    ??? Substance Abuse Disorder Neg Hx    ??? Alcohol abuse Neg Hx    ??? Drug abuse Neg Hx        ROS- 12 system review is negative other than what is specified in the History of Present Illness.      Objective:   Physical Exam  No physical exam due to phone visit     Laboratory data:    Electrocardiogram:  From January 2020 showed NSR and minimal voltage criteria for LVH.     Echocardiogram:  From November 2019 was a technically difficult study. Showed normal LV function with EF 60-65% and mild LVH. Showed mild LA dilatation and mildly elevated RA pressure. Also showed grade 2 diastolic dysfunction, mild aortic regurgitation, mild TR, and mildly elevated PA pressure.    Nuclear stress test:  From December 2019 was a normal study. Showed no evidence for significant ischemia or scar. Post stress showed normal global systolic function with EF 63%.    Cardiac cath 08/04/2007 [facility])  Left main: Normal  LAD: Large caliber vessel with 90% distal stenosis.  Treated with 2.5X 24 Endeavor drug-eluting stent.  LCx: Large caliber nondominant dominant vessel with mild angiographic disease  Left ventriculogram normal 65% with apical hypokinesis, LVEDP 21 mmHg.  No evidence of aortic stenosis or mitral regurgitation.    Lipid panel:  Component      Latest Ref Rng & Units 04/09/2019  LDL Direct      60.0 - 99.0 mg/dL 161.0 (H)       This note was entered by Renato Shin acting as scribe for [employer], AGNP-C.  Signature: CAHL  Date: 04/13/19  Time: 2:04 PM    I have reviewed the documentation provided by the scribe and confirm that it accurately reflects the service I personally performed and the decisions made by me.  Signature: CJR  Date: 04/13/19  Time: 2:04 PM

## 2019-04-13 NOTE — Unmapped (Signed)
Per test claim for REPATHA at the [facility], patient needs Medication Assistance Program for Prior Authorization.

## 2019-04-13 NOTE — Unmapped (Signed)
Will send Rx for Repatha - the shared services pharmacy will contact you    I will have the social worker reach back out to you for smoking cessation program.     Keep exercising. Work on mindfulness training/meditation

## 2019-04-15 MED ORDER — PROMETHAZINE 12.5 MG TABLET
ORAL_TABLET | Freq: Four times a day (QID) | ORAL | 0 refills | 0 days | Status: CP | PRN
Start: 2019-04-15 — End: 2020-04-14
  Filled 2019-04-19: qty 20, 5d supply, fill #0

## 2019-04-15 MED ORDER — ORPHENADRINE CITRATE ER 100 MG TABLET,EXTENDED RELEASE
ORAL_TABLET | Freq: Two times a day (BID) | ORAL | 0 refills | 10.00000 days | Status: CP | PRN
Start: 2019-04-15 — End: 2020-04-14
  Filled 2019-04-27: qty 20, 10d supply, fill #0

## 2019-04-15 MED ORDER — EMPTY CONTAINER
3 refills | 0.00000 days
Start: 2019-04-15 — End: ?

## 2019-04-15 NOTE — Unmapped (Signed)
P/C requesting refill on Norflex and Phenergan , last ordered on 02/25/2019, last visit 02/25/2019.

## 2019-04-15 NOTE — Unmapped (Signed)
[facility] Specialty Medication Referral: PA Approved      Medication (Brand/Generic): REPATHA    Final Test Claim completed with resulted information below:    Patient ABLE to fill at [facility]  Insurance Company:  [facility] MEDICARE PART D  Anticipated Copay: $3.90 FOR 84 DAYS  Is anticipated copay with a copay card or grant? No, there is no need for grant or copay assistance.     Does this patient have to receive a partial fill of the medication due to insurance restrictions? NO  If so, please cofirm how many days supply is allowed per plan per fill and how long the patient will have to fill partial months supply for the medication: NOT APPLICABLE     If the copay is under the $25 defined limit, per policy there will be no further investigation of need for financial assistance at this time unless patient requests. This referral has been communicated to the provider and handed off to the [facility] team for further processing and filling of prescribed medication.   ______________________________________________________________________  Please utilize this referral for viewing purposes as it will serve as the central location for all relevant documentation and updates.

## 2019-04-15 NOTE — Unmapped (Signed)
[facility]   Patient Onboarding/Medication Counseling    Ms.Henthorn is a 63 y.o. female with hyperlipidemia who I am counseling today on initiation of therapy.  I am speaking to the patient.    Verified patient's date of birth / HIPAA.    Specialty medication(s) to be sent: General Specialty: Repatha      Non-specialty medications/supplies to be sent: sharps  Transfers requested: amlodipine, atorvastatin, chlorthalidone, citalopram, hydroxyzine, losartan, metoprolol, omeprazole, orphenadrine, promethazine      Medications not needed at this time: none         Repatha (evolocumab)    Medication & Administration     Dosage: Inject the contents of 1 pen (140mg ) under the skin every 2 weeks.    Administration: Administer under the skin of the abdomen, thigh or upper arm. Rotate sites with each injection.  ? Injection instructions - Autoinjector:  o Remove 1 Repatha autoinjector from the refrigerator and let stand at room temperature for at least 30 minutes.  o Check the autoinjector for the following:  ? Expiration date  ? Absence of any cracks or damage  ? The medicine is clear and colorless and does not contain any particles  ? The orange cap is present and securely attached  o Choose your injection site and clean with an alcohol wipe. Allow to air dry completely.  o Pull the orange cap straight off and discard  o Pinch the skin (or stretch) with your thumb and fingers creating an area 2 inches wide  o Maintaining the pinch (or stretch) press the pen to your skin at a 90 degree angle. Firmly push the autoinjector down until the skin stops moving and the yellow safety guard is no longer visible.  o Do not touch the gray start button yet  o When you are ready to inject, press the gray start button. You will hear a click that signals the start of the injection  o Continue to press the pen to your skin and lift your thumb  o The injection may take up to 15 seconds. You will know the injection is complete when the medication window turns yellow. You may also hear a second click.  o Remove the pen from your skin and discard the pen in a sharps container.  o If there is blood at the injection site, press a cotton ball or gauze to the site. Do not rub the injection site.    Adherence/Missed dose instructions: Administer a missed dose within 7 days and resume your normal schedule.  If it has been more than 7 days and you inject every 2 weeks, skip the missed dose and resume your normal schedule..     Goals of Therapy     Lower cholesterol, prevention of cardiovascular events in patients with established cardiovascular disease    Side Effects & Monitoring Parameters   ? Flu-like symptoms  ? Signs of a common cold  ? Back pain  ? Injection site irritation  ? Nose or throat irritation    The following side effects should be reported to the provider:  ? Signs of an allergic reaction  ? Signs of high blood sugar (confusion, drowsiness, increase thirst/hunger/urination, fast breathing, flushing)      Contraindications, Warnings, & Precautions     ? Latex (the packaging of Repatha may contain natural rubber)    Drug/Food Interactions     ? Medication list reviewed in Epic. The patient was instructed to inform the care  team before taking any new medications or supplements. No drug interactions identified.     Storage, Handling Precautions, & Disposal   ? Repatha should be stored in the refrigerator. If necessary, Repatha may be kept at room temperature for no more than 30 days.  ? Place used devices in a sharps container for disposal.      Current Medications (including OTC/herbals), Comorbidities and Allergies     Current Outpatient Medications   Medication Sig Dispense Refill   ??? ALBUTEROL, BULK, MISC Frequency:null   Dosage:3   gm  Instructions:0.002 = gm 3 mL, inhalation, q6h, for wheezing, # 25 EA, 0, Print Requisition  Note:     ??? amLODIPine (NORVASC) 10 MG tablet Take 1 tablet (10 mg total) by mouth daily. 90 tablet 3   ??? aspirin (ECOTRIN) 81 MG tablet Take 81 mg by mouth daily.     ??? atorvastatin (LIPITOR) 40 MG tablet Take 1 tablet (40 mg total) by mouth daily. 90 tablet 3   ??? chlorthalidone (HYGROTON) 25 MG tablet Take 1 tablet (25 mg total) by mouth daily. 30 tablet 6   ??? citalopram (CELEXA) 40 MG tablet Take 1 tablet (40 mg total) by mouth daily. 90 tablet 3   ??? evolocumab (REPATHA SYRINGE) 140 mg/mL Syrg Inject the contents of 1 syringe (140 mg) under the skin every fourteen (14) days. 6 mL 3   ??? hydrOXYzine (ATARAX) 25 MG tablet Take 1 tablet (25 mg total) by mouth every six (6) hours as needed. for anxiety and congestion. (Patient taking differently: Take 25 mg by mouth two (2) times a day. for anxiety and congestion.) 60 tablet 3   ??? losartan (COZAAR) 100 MG tablet Take 1 tablet (100 mg total) by mouth daily. 90 tablet 3   ??? metoprolol succinate (TOPROL-XL) 100 MG 24 hr tablet Take 1 tablet (100 mg total) by mouth two (2) times a day. Repeat for elevated BP. 180 tablet 3   ??? nicotine (NICODERM CQ) 14 mg/24 hr patch Place 1 patch on the skin daily. (Patient not taking: Reported on 03/17/2019) 28 patch 2   ??? omeprazole (PRILOSEC) 20 MG capsule Take 1 capsule (20 mg total) by mouth two (2) times a day. 180 capsule 1   ??? orphenadrine (NORFLEX) 100 mg tablet Take 1 tablet (100 mg total) by mouth two (2) times a day as needed for muscle spasms. 20 tablet 0   ??? promethazine (PHENERGAN) 12.5 MG tablet Take 1 tablet (12.5 mg total) by mouth every six (6) hours as needed for nausea. 20 tablet 0   ??? traMADoL (ULTRAM) 50 mg tablet Take 1-1.5 tablets (50-75 mg total) by mouth Every six (6) hours. As needed for pain 20 tablet 0     No current facility-administered medications for this visit.        Allergies   Allergen Reactions   ??? Iodine And Iodide Containing Products Anaphylaxis   ??? Statins-Hmg-Coa Reductase Inhibitors      Side effects   ??? Sulfa (Sulfonamide Antibiotics)        Patient Active Problem List   Diagnosis   ??? 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 Hypertension   ??? Myocardial infarction (CMS-HCC)   ??? Mild intermittent asthma without complication   ??? Anxiety   ??? Depression   ??? Post traumatic stress disorder (PTSD)   ??? Fibromyalgia   ??? Panic disorder   ??? Chest pain   ???  Urge incontinence   ??? Back spasm   ??? Dyslipidemia   ??? Epigastric pain   ??? Nausea & vomiting   ??? Elevated liver enzymes   ??? Pancreatitis   ??? Tobacco abuse       Reviewed and up to date in Epic.    Appropriateness of Therapy     Is medication and dose appropriate based on diagnosis? Yes    Baseline Quality of Life Assessment      How many days over the past month did your hyperlipidemia keep you from your normal activities? 0    Financial Information     Medication Assistance provided: Prior Authorization    Anticipated copay of $3.90/84 days reviewed with patient. Verified delivery address.    Delivery Information     Scheduled delivery date: 04/19/19    Expected start date: 04/19/19    Medication will be delivered via Same Day Courier to the home address in [place].  This shipment will not require a signature.      Explained the services we provide at [facility] and that each month we would call to set up refills.  Stressed importance of returning phone calls so that we could ensure they receive their medications in time each month.  Informed patient that we should be setting up refills 7-10 days prior to when they will run out of medication.  A pharmacist will reach out to perform a clinical assessment periodically.  Informed patient that a welcome packet and a drug information handout will be sent.      Patient verbalized understanding of the above information as well as how to contact the pharmacy at [phone] option 4 with any questions/concerns.  The pharmacy is open Monday through Friday 8:30am-4:30pm.  A pharmacist is available 24/7 via pager to answer any clinical questions they may have.    Patient Specific Needs     ? Does the patient have any physical, cognitive, or cultural barriers? No    ? Patient prefers to have medications discussed with  Patient     ? Is the patient able to read and understand education materials at a high school level or above? Yes    ? Patient's primary language is  English     ? Is the patient high risk? No     ? Does the patient require a Care Management Plan? No     ? Does the patient require physician intervention or other additional services (i.e. nutrition, smoking cessation, social work)? No      Arnold Long  [facility] Specialty Pharmacist

## 2019-04-16 MED ORDER — EMPTY CONTAINER: each | 3 refills | 0 days

## 2019-04-19 MED FILL — LOSARTAN 100 MG TABLET: 90 days supply | Qty: 90 | Fill #0 | Status: AC

## 2019-04-19 MED FILL — PROMETHAZINE 12.5 MG TABLET: 5 days supply | Qty: 20 | Fill #0 | Status: AC

## 2019-04-19 MED FILL — EMPTY CONTAINER: 120 days supply | Qty: 1 | Fill #0 | Status: AC

## 2019-04-19 MED FILL — LOSARTAN 100 MG TABLET: ORAL | 90 days supply | Qty: 90 | Fill #0

## 2019-04-19 MED FILL — CITALOPRAM 40 MG TABLET: ORAL | 90 days supply | Qty: 90 | Fill #0

## 2019-04-19 MED FILL — OMEPRAZOLE 20 MG CAPSULE,DELAYED RELEASE: 90 days supply | Qty: 180 | Fill #0 | Status: AC

## 2019-04-19 MED FILL — AMLODIPINE 10 MG TABLET: ORAL | 90 days supply | Qty: 90 | Fill #0

## 2019-04-19 MED FILL — CITALOPRAM 40 MG TABLET: 90 days supply | Qty: 90 | Fill #0 | Status: AC

## 2019-04-19 MED FILL — HYDROXYZINE HCL 25 MG TABLET: 15 days supply | Qty: 60 | Fill #0 | Status: AC

## 2019-04-19 MED FILL — HYDROXYZINE HCL 25 MG TABLET: ORAL | 15 days supply | Qty: 60 | Fill #0

## 2019-04-19 MED FILL — REPATHA SYRINGE 140 MG/ML SUBCUTANEOUS SYRINGE: 84 days supply | Qty: 6 | Fill #0 | Status: AC

## 2019-04-19 MED FILL — METOPROLOL SUCCINATE ER 100 MG TABLET,EXTENDED RELEASE 24 HR: ORAL | 90 days supply | Qty: 180 | Fill #0

## 2019-04-19 MED FILL — AMLODIPINE 10 MG TABLET: 90 days supply | Qty: 90 | Fill #0 | Status: AC

## 2019-04-19 MED FILL — CHLORTHALIDONE 25 MG TABLET: 30 days supply | Qty: 30 | Fill #0 | Status: AC

## 2019-04-19 MED FILL — METOPROLOL SUCCINATE ER 100 MG TABLET,EXTENDED RELEASE 24 HR: 90 days supply | Qty: 180 | Fill #0 | Status: AC

## 2019-04-19 MED FILL — EMPTY CONTAINER: 120 days supply | Qty: 1 | Fill #0

## 2019-04-19 MED FILL — ATORVASTATIN 40 MG TABLET: 90 days supply | Qty: 90 | Fill #0 | Status: AC

## 2019-04-27 MED FILL — ORPHENADRINE CITRATE ER 100 MG TABLET,EXTENDED RELEASE: 10 days supply | Qty: 20 | Fill #0 | Status: AC

## 2019-05-04 ENCOUNTER — Ambulatory Visit: Admit: 2019-05-04 | Discharge: 2019-05-05 | Payer: MEDICARE

## 2019-05-05 ENCOUNTER — Ambulatory Visit: Admit: 2019-05-05 | Discharge: 2019-05-08 | Disposition: A | Discharge status: Home / Self Care | Payer: MEDICARE

## 2019-05-05 DIAGNOSIS — K8591 Acute pancreatitis with uninfected necrosis, unspecified: Principal | ICD-10-CM

## 2019-05-05 LAB — COMPREHENSIVE METABOLIC PANEL
ALKALINE PHOSPHATASE: 104 U/L (ref 38–126)
ALT (SGPT): 15 U/L (ref <35)
ANION GAP: 7 mmol/L (ref 7–15)
AST (SGOT): 19 U/L (ref 14–38)
BILIRUBIN TOTAL: 0.4 mg/dL (ref 0.0–1.2)
BLOOD UREA NITROGEN: 16 mg/dL (ref 7–21)
BUN / CREAT RATIO: 22
CALCIUM: 9.4 mg/dL (ref 8.5–10.2)
CHLORIDE: 103 mmol/L (ref 98–107)
CO2: 30 mmol/L (ref 22.0–30.0)
CREATININE: 0.73 mg/dL (ref 0.60–1.00)
EGFR CKD-EPI AA FEMALE: >90 mL/min/{1.73_m2} (ref >=60)
EGFR CKD-EPI NON-AA FEMALE: 89 mL/min/{1.73_m2} (ref >=60)
GLUCOSE RANDOM: 117 mg/dL (ref 70–179)
POTASSIUM: 3.8 mmol/L (ref 3.5–5.0)
PROTEIN TOTAL: 7.1 g/dL (ref 6.5–8.3)
SODIUM: 140 mmol/L (ref 135–145)

## 2019-05-05 LAB — CBC W/ AUTO DIFF
BASOPHILS ABSOLUTE COUNT: 0 10*9/L (ref 0.0–0.1)
BASOPHILS RELATIVE PERCENT: 0.3 %
EOSINOPHILS ABSOLUTE COUNT: 0.3 10*9/L (ref 0.0–0.4)
EOSINOPHILS RELATIVE PERCENT: 2.4 %
HEMATOCRIT: 42.3 % (ref 36.0–46.0)
HEMOGLOBIN: 13.9 g/dL (ref 13.5–16.0)
LARGE UNSTAINED CELLS: 2 % (ref 0–4)
LYMPHOCYTES RELATIVE PERCENT: 24.3 %
MEAN CORPUSCULAR HEMOGLOBIN CONC: 32.7 g/dL (ref 31.0–37.0)
MEAN CORPUSCULAR HEMOGLOBIN: 29.1 pg (ref 26.0–34.0)
MEAN CORPUSCULAR VOLUME: 88.9 fL (ref 80.0–100.0)
MONOCYTES ABSOLUTE COUNT: 0.4 10*9/L (ref 0.2–0.8)
MONOCYTES RELATIVE PERCENT: 3.4 %
NEUTROPHILS RELATIVE PERCENT: 67.7 %
PLATELET COUNT: 283 10*9/L (ref 150–440)
RED BLOOD CELL COUNT: 4.76 10*12/L (ref 4.00–5.20)
RED CELL DISTRIBUTION WIDTH: 15 % (ref 12.0–15.0)
WBC ADJUSTED: 11.2 10*9/L — ABNORMAL HIGH (ref 4.5–11.0)

## 2019-05-05 LAB — LIPASE: Triacylglycerol lipase:CCnc:Pt:Ser/Plas:Qn:: 1914 — ABNORMAL HIGH

## 2019-05-05 LAB — MEAN CORPUSCULAR VOLUME: Lab: 88.9

## 2019-05-05 LAB — TROPONIN I: Troponin I.cardiac:MCnc:Pt:Ser/Plas:Qn:: <0.06

## 2019-05-05 LAB — LACTATE BLOOD VENOUS: Lactate:SCnc:Pt:BldV:Qn:: 1.4

## 2019-05-05 LAB — CALCIUM: Calcium:MCnc:Pt:Ser/Plas:Qn:: 9.4

## 2019-05-05 NOTE — Unmapped (Signed)
[facility] Emergency Department Provider Note    ED Clinical Impression     Final diagnoses:   Acute necrotizing pancreatitis (Primary)     Initial Impression, ED Course, Assessment and Plan   Patient is a 63 y.o. female with PMH of pancreatitis, cholecystitis (5 years ago), anxiety, depression, asthma, HLD, HTN, and CAD with hx of MI (s/p stent placement 10/2006) presenting to the ED for ~3-4 days of abdominal pain with associated nausea and vomiting.  On exam, the patient appears well and is in NAD. Lungs and heart sounds clear. Diffusely tender abdomen worse to LUQ and epigastric region. BS active. Abdomen soft non-distended, and non-rigid.    Differential includes likely pancreatis given hx of similar vs peptic ulcer disease vs. Gastritis. Less likely appendicitis vs. Diverticulitis vs. SBO. Will obtain basic labs and treat with IV fluids, Zofran, and morphine. Plan to reassess.     1:50 PM  Lipase elevated to 1914. Labs otherwise unremarkable. Ordered MRCP given pt's severe allergy to IV contrast     5:15 PM  MRCP with acute necrotizing pancreatitis, will speak with [facility] General Surgery.     Mri Abdomen W Contrast Mrcp    Result Date: 05/05/2019  EXAM: MRI abdomen with and without contrast, MRCP DATE: 05/05/2019 3:06 PM ACCESSION: [id number] UN DICTATED: 05/05/2019 4:09 PM INTERPRETATION LOCATION: Main Campus CLINICAL INDICATION: 63 year old Female with pancreatitis; Pancreatitis, acute, initial episode ; Pancreatitis suspected, atypical presentation/labs  COMPARISON: MRI/MRCP 11/11/2018 TECHNIQUE: MRI of the abdomen was obtained with and without IV contrast.  Multisequence, multiplanar and dedicated T2 weighted images that highlight the biliary tree were obtained. FINDINGS: LINES/DEVICES: None. LOWER CHEST: Unremarkable. ABDOMEN: HEPATOBILIARY: No focal hepatic lesions. Dilated common bile duct which measures 1.1 cm proximally with gradual tapering (14:43). Mild/moderate intrahepatic biliary ductal dilatation also similar to prior. Gallbladder is surgically absent. PANCREAS: Edematous pancreatic parenchyma with overall decreased T1 signal intensity notably within the pancreatic body and tail. New 2.9 x 2.3 cm heterogeneous T2 hyperintense/T1 hypointense collection within the pancreatic body which demonstrates peripheral enhancement (20:52), with central necrosis and internal debris. This collection compresses the main pancreatic duct. Dilated main pancreatic duct which measures 0.6 cm (14:43), new from prior. SPLEEN: Unremarkable. ADRENAL GLANDS: Unremarkable. KIDNEYS/URETERS: Kidneys enhance symmetrically. No hydronephrosis. Stable right renal cysts. BOWEL/PERITONEUM/RETROPERITONEUM: No bowel obstruction. Colonic diverticulosis. No acute inflammatory process. No ascites. VASCULATURE: Abdominal aorta within normal limits for patient's age. Unremarkable inferior vena cava. Patent portal venous system LYMPH NODES: No adenopathy. BONES/SOFT TISSUES: No suspicious appearing osseous lesions.     - Acute necrotizing pancreatitis with a 2.9 cm acute necrotic collection within the pancreatic body. - Dilated main pancreatic duct, potentially secondary to mass effect by the acute necrotic collection.    5:23 PM  Spoke with [facility] Dr. Orvan Falconer, advises transfer to [facility], will speak with general surgery at main. Paged GenSug     17:57  Spoke to [employer] [facility] who advised calling Attending, paged Helyn Numbers, MD    18:10  The patient requires care not available at this medical campus.  She will be transported to [facility] for ongoing management. ??I have discussed this with the patient, who state(s) understanding and agreement with this plan.    19:41  Pt. Admitted to medicine team, awaiting bed assignment and transfer to [facility].    Labs     Labs Reviewed   LIPASE - Abnormal; Notable for the following components:       Result Value  Lipase 1,914 (*)     All other components within normal limits   CBC W/ AUTO DIFF - Abnormal; Notable for the following components:    WBC 11.2 (*)     Absolute Neutrophils 7.6 (*)     All other components within normal limits    Narrative:     Please use the Absolute Differential for reference ranges.    TROPONIN I - Normal   LACTATE, VENOUS, WHOLE BLOOD - Normal   COVID-19 PCR   COMPREHENSIVE METABOLIC PANEL   CBC W/ DIFFERENTIAL    Narrative:     The following orders were created for panel order CBC w/ Differential.                  Procedure                               Abnormality         Status                                     ---------                               -----------         ------                                     CBC w/ Differential[[id number]]         Abnormal            Final result                                                 Please view results for these tests on the individual orders.       History   Chief Complaint  Abdominal Pain    HPI   Patient was seen by me at 12:08 PM.    Patient is a 63 y.o. female with a PMH of pancreatitis, cholecystitis (5 years ago), anxiety, depression, asthma, HLD, HTN, and CAD with hx of MI (s/p stent placement 10/2006) presenting to the ED for abdominal pain. Patient reports sudden onset of diffuse abdominal pain ~3-4 days ago. She notes that her pain is similar to pain with past pancreatitis flares. She further reports developing nausea and vomiting 2 days ago. She notes difficulty with PO intake except for liquids 2/2 nausea and vomiting. She further reports diarrhea, but this has resolved after switching PO intake to only water. Patient states that she is taking phenergan with slight improvement of nausea. No past colonoscopy, but patient states she was scheduled one and the order expired after she could not make the appointment. Patient has not seen GI specialist for her symptoms. Denies EtOH consumption. Denies fever, cough, sore throat, or urinary symptoms.     Previous chart, nursing notes, and vital signs reviewed. Pertinent labs & imaging results that were available during my care of the patient were reviewed by me and considered in my medical decision making (see chart for details).    Past Medical History:  Diagnosis Date   ??? Anxiety    ??? Asthma    ??? BPPV (benign paroxysmal positional vertigo)    ??? Depression    ??? Fibromyalgia    ??? Heart disease    ??? Hyperlipidemia    ??? Hypertension    ??? Myocardial infarction (CMS-HCC) 2008    s/p stent placement x 1 at [facility]   ??? Panic disorder    ??? Urge incontinence        Past Surgical History:   Procedure Laterality Date   ??? cardiac stent x 1     ??? CHOLECYSTECTOMY     ??? HYSTERECTOMY     ??? PR ERCP,SPHINCTEROTOMY N/A 11/12/2018    Procedure: ERCP; W/SPHINCTEROTOMY/PAPILLOTOMY;  Surgeon: Vonda Antigua, MD;  [facility]: [facility];  Service: Gastroenterology   ??? PR ERCP,W/REMOVAL STONE,BIL/PANCR DUCTS N/A 11/12/2018    Procedure: ERCP; W/ENDOSCOPIC RETROGRADE REMOVAL OF CALCULUS/CALCULI FROM BILIARY &/OR PANCREATIC DUCTS;  Surgeon: Vonda Antigua, MD;  Location: [facility];  Service: Gastroenterology   ??? TONSILLECTOMY           Current Facility-Administered Medications:   ???  piperacillin-tazobactam (ZOSYN) IVPB (premix) 4.5 g, 4.5 g, Intravenous, Once, [employer], AGNP, Last Rate: 200 mL/hr at 05/05/19 1747, 4.5 g at 05/05/19 1747  ???  sodium chloride (NS) 0.9 % infusion, 125 mL/hr, Intravenous, Continuous, Demetris Capell Cox Aikam Hellickson, AGNP, Last Rate: 125 mL/hr at 05/05/19 1757, 125 mL/hr at 05/05/19 1757    Current Outpatient Medications:   ???  ALBUTEROL, BULK, MISC, Frequency:null   Dosage:3   gm  Instructions:0.002 = gm 3 mL, inhalation, q6h, for wheezing, # 25 EA, 0, Print Requisition  Note:, Disp: , Rfl:   ???  amLODIPine (NORVASC) 10 MG tablet, Take 1 tablet (10 mg total) by mouth daily., Disp: 90 tablet, Rfl: 3  ???  aspirin (ECOTRIN) 81 MG tablet, Take 81 mg by mouth daily., Disp: , Rfl:   ???  atorvastatin (LIPITOR) 40 MG tablet, Take 1 tablet (40 mg total) by mouth daily., Disp: 90 tablet, Rfl: 3  ???  chlorthalidone (HYGROTON) 25 MG tablet, Take 1 tablet (25 mg total) by mouth daily., Disp: 30 tablet, Rfl: 6  ???  citalopram (CELEXA) 40 MG tablet, Take 1 tablet (40 mg total) by mouth daily., Disp: 90 tablet, Rfl: 3  ???  empty container (SHARPS CONTAINER) Misc, Use as directed., Disp: 1 each, Rfl: 3  ???  empty container Misc, Use as directed to dispose of injectable medications, Disp: 1 each, Rfl: 3  ???  evolocumab (REPATHA SYRINGE) 140 mg/mL Syrg, Inject the contents of 1 syringe (140 mg) under the skin every fourteen (14) days., Disp: 6 mL, Rfl: 3  ???  hydrOXYzine (ATARAX) 25 MG tablet, Take 1 tablet (25 mg total) by mouth every six (6) hours as needed for anxiety and congestion., Disp: 60 tablet, Rfl: 3  ???  losartan (COZAAR) 100 MG tablet, Take 1 tablet (100 mg total) by mouth daily., Disp: 90 tablet, Rfl: 3  ???  metoprolol succinate (TOPROL-XL) 100 MG 24 hr tablet, Take 1 tablet (100 mg total) by mouth two (2) times a day. Repeat for elevated BP., Disp: 180 tablet, Rfl: 3  ???  nicotine (NICODERM CQ) 14 mg/24 hr patch, Place 1 patch on the skin daily. (Patient not taking: Reported on 03/17/2019), Disp: 28 patch, Rfl: 2  ???  omeprazole (PRILOSEC) 20 MG capsule, Take 1 capsule (20 mg total) by mouth two (2) times a  day., Disp: 180 capsule, Rfl: 1  ???  orphenadrine (NORFLEX) 100 mg tablet, Take 1 tablet (100 mg total) by mouth two (2) times a day as needed for muscle spasms., Disp: 20 tablet, Rfl: 0  ???  promethazine (PHENERGAN) 12.5 MG tablet, Take 1 tablet (12.5 mg total) by mouth every six (6) hours as needed for nausea., Disp: 20 tablet, Rfl: 0  ???  traMADoL (ULTRAM) 50 mg tablet, Take 1-1.5 tablets (50-75 mg total) by mouth Every six (6) hours. As needed for pain, Disp: 20 tablet, Rfl: 0    Allergies  Iodine and iodide containing products, Statins-hmg-coa reductase inhibitors, and Sulfa (sulfonamide antibiotics)    Family History   Problem Relation Age of Onset   ??? Heart disease Father    ??? Coronary artery disease Father         quadruple bypass   ??? Mental illness Father    ??? Substance Abuse Disorder Neg Hx    ??? Alcohol abuse Neg Hx    ??? Drug abuse Neg Hx        Social History  Social History     Tobacco Use   ??? Smoking status: Current Every Day Smoker     Packs/day: 0.75     Types: Cigarettes   ??? Smokeless tobacco: Never Used   ??? Tobacco comment: smokes 0.5-0.75 ppd   Substance Use Topics   ??? Alcohol use: Not Currently     Frequency: Monthly or less   ??? Drug use: Not Currently     Types: Cocaine     Comment: not used cocaine in 13 years     Review of Systems  Constitutional: Negative for fever or chills. Negative for wt. loss.  Cardiovascular: Negative for chest pain or palpitations.  Respiratory: Negative for shortness of breath or cough.  Gastrointestinal: Positive for abdominal pain, nausea, vomiting, and diarrhea.  Genitourinary: Negative for dysuria, urgency, frequency, hesitancy, hematuria. Negative for vaginal discharge or bleeding.  Musculoskeletal: Negative for back pain. Negative for neck pain. Negative for joint pain or swelling.  Skin: Negative for rash. Negative for pallor. Negative for diaphoresis.  Neurological: Negative for headaches, weakness, AMS, LOC, dizziness, or numbness.  Psych: Negative for SI, HI, A/V Hallucinations. Negative for agitation.     Physical Exam     VITAL SIGNS:    Vitals:    05/05/19 1137 05/05/19 1535   BP: 119/74 122/57   Pulse: 70 65   Resp: 18 18   Temp: 36.8 ??C (98.3 ??F) 35.8 ??C (96.4 ??F)   TempSrc: Oral Oral   SpO2: 98% 97%   Weight: (!) 106.1 kg (234 lb)        Constitutional: Alert and oriented. Well appearing and in no distress.  Eyes: Conjunctivae are normal. PERRL. EOMs intact.        Head: Normocephalic and atraumatic.       Ear: EACs without exudate or erythema. TMs without erythema or effusion       Nose: No congestion. No epistaxis       Mouth/Throat: Mucous membranes are moist without lesions/ulcerations.        Neck: No stridor. Thyroid without nodules. Full ROM without pain.  Cardiovascular: Normal rate, regular rhythm. Normal and symmetric distal pulses are present in all extremities.  Respiratory: Normal respiratory effort. Breath sounds are normal.  Gastrointestinal: Diffusely tender abdomen worse to LUQ and epigastric region. BS active. Abdomen soft non-distended, and non-rigid.  Musculoskeletal: Nontender joints and muscles with normal range of  motion in all extremities.  Neurologic: Normal speech and language.  Skin: Skin is warm, dry and intact. No rash noted. No pallor.  Psychiatric: Mood and affect are normal. Speech and behavior are normal.      Documentation assistance was provided by Gilmore Laroche, Scribe on May 05, 2019 at 12:41 PM for [employer], AGNP.    May 05, 2019 2:11 PM. Documentation assistance provided by the scribe. I was present during the time the encounter was recorded. The information recorded by the scribe was done at my direction and has been reviewed and validated by me.        [address] [place], [place]  05/05/19 1941

## 2019-05-05 NOTE — Unmapped (Signed)
CT scan on hold (allergy? , labs)

## 2019-05-05 NOTE — Unmapped (Signed)
Patient reports hx of pancreatitis. States she is having spasms and feels these symptoms are similar to when she had pancreatitis.   Vomited 4 times over the past couple to days.   Patient did not contact her doctor because she just got back from vacation and didn't think she could get in.   No active vomiting at triage

## 2019-05-06 DIAGNOSIS — K8591 Acute pancreatitis with uninfected necrosis, unspecified: Principal | ICD-10-CM

## 2019-05-06 LAB — INR: Lab: 1.14

## 2019-05-06 LAB — TRIGLYCERIDES: Triglyceride:MCnc:Pt:Ser/Plas:Qn:: 127

## 2019-05-06 NOTE — Unmapped (Signed)
GASTROENTEROLOGY BILIARY INPATIENT CONSULTATION H&P      Requesting Attending Physician:  Clayborne Artist Jory Sims,*  Requesting Consult Service: [facility]    Reason for Consult:    Marie Quinn is a 63 y.o. female seen in consultation at the request of Dr. Clayborne Artist Jory Sims,* for necrotizing pancreatitis.    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 Her imaging demonstrates an acute necrotic collection (2.9cm) within the pancreatic body and a dilated main pancreatic duct.     The etiology of her pancreatitis is unclear at this time. She describes distinct episodes of pain over the past couple of months, the most recent of which started last week and led to this hospitalization. These other episodes could certainly be separate episodes of acute pancreatitis. The etiology in her case is not biliary (given her normal liver tests), she denies alcohol use, and her triglycerides are normal. The history she provides of her mother having frequent pancreatitis raises the question of hereditary causes. She also takes chlorthalidone, a well-described culprit med, and smokes cigarettes.    In regards to the acute necrotic collection, management is supportive with IV fluids and pain control as you are doing. These acute necrotic collections are initially sterile, but have the potential to become infected. Although she is hypotensive and has a mild leukocytosis, we have low suspicion for this causing acute infection.     Her imaging is also notable for a dilated main pancreatic duct. This may be related to mass effect from the fluid collection or it is possible she developed a stricture after her episode of pancreatitis in January. She may benefit from attempt at pancreatic duct stent placement. We will continue to monitor symptoms and if no improvement over the next 24 hours, will consider ERCP tomorrow.      Recommendations:  - continue IV fluids  - pain control and antiemetics per primary team  - discontinue antibiotics unless concerned for another source of infection  - ok for PO liquids today  - NPO at midnight for possible ERCP tomorrow (7/24)  - ok for DVT ppx today, but discontinue at midnight  - consider discontinuing chlorthalidone upon discharge      Thank you for this consult. Discussed with Drs. Hathorn and Gangarosa. Recommendations discussed with primary team. We will continue to follow along. Please page the GI Biliary consult pager at [phone] with any further questions or change in clinical condition.      Tammi Sou, MD  Gastroenterology & Hepatology Fellow, PGY-4  [facility]      HPI:     Chief Complaint: extreme pain    History of Present Illness:   This is a 63 y.o. female with PMH abnormal liver tests, post-ERCP pancreatitis, obesity, CAD c/b MI s/p PCI to distal LAD (2008), HTN, HLD, OSA, tobacco use disorder, HFpEF, and s/p cholecystectomy who presents with abdominal pain and emesis.     Marie Quinn initially presented to our hospital in late January with abnormal liver tests elevated in a mixed pattern (AlkP 316, AST 546, ALT 380, Tbili 1.9) and was found to have mildly increased intrahepatic and extrahepatic biliary ductal dilation on MRCP. An ERCP was done and a biliary sphincterotomy  was performed, sludge was found. She unfortunately developed post-ERCP pancreatitis. There was also concern that her liver tests may have been secondary to DILI related to Macrobid. They did improve after ERCP and with discontinuing Macrobid and had normalized by early February. Since that time, she states that she has had several episodes of abdominal pain that she has managed at home with tramadol, phenergan, and PO fluids. She characterizes the pain as severe. It usually lasts about 3-4 days before resolving. She presented to her PCP in mid-May during one of these episodes and lipase was 316 at that time. She denies abdominal pain in between these episodes, but does have chronic back pain.     The pain recurred last week. It starts in her back and radiates around to her mid-epigastrium. It initially improved some over the weekend, but then worsened again prompting her to present to the ED for evaluation. The abdominal pain is now more diffuse. It was accompanied by vomiting, decreased appetite, and chills. She denies recent fevers or weight loss.     She was hemodynamically stable on arrival, but has become more hypotensive since admission to 90s/50s off her home anti-hypertensives. She remains afebrile. Labs are notable for WBC 11, normal liver tests, lipase 1914, triglycerides 127, Ca 9.4. MRCP demonstrates acute necrotizing pancreatitis with a 2.9cm acute necrotic collection within the pancreatic body and a dilated main pancreatic duct. She has been started on IV fluids and empiric IV zosyn. She states that her pain continues to be severe, rating it 8/10, because she has not received analgesics since last night.     Her medical history is notable for hypertension, improved since initiation of chlorthalidone in early June. She was started on evolocumab 3 weeks ago for HLD. Her family history is notable for pancreatitis in her mother 1-2 times yearly. She smokes 1ppd.    Review of Systems:  The balance of 12 systems reviewed is negative except as noted in the HPI.     Medical History:     Past Medical History:  Past Medical History:   Diagnosis Date   ??? Anxiety    ??? Asthma    ??? BPPV (benign paroxysmal positional vertigo)    ??? Depression    ??? Fibromyalgia    ??? Heart disease    ??? Hyperlipidemia    ??? Hypertension    ??? Myocardial infarction (CMS-HCC) 2008    s/p stent placement x 1 at [facility]   ??? Panic disorder    ??? Urge incontinence        Surgical History:  Past Surgical History:   Procedure Laterality Date   ??? cardiac stent x 1     ??? CHOLECYSTECTOMY     ??? PR ERCP,SPHINCTEROTOMY N/A 11/12/2018    Procedure: ERCP; W/SPHINCTEROTOMY/PAPILLOTOMY;  Surgeon: Vonda Antigua, MD;  [facility];  Service: Gastroenterology   ??? PR ERCP,W/REMOVAL STONE,BIL/PANCR DUCTS N/A 11/12/2018    Procedure: ERCP; W/ENDOSCOPIC RETROGRADE REMOVAL OF CALCULUS/CALCULI FROM BILIARY &/OR PANCREATIC DUCTS;  Surgeon: Vonda Antigua, MD;  Location: [facility];  Service: Gastroenterology   ??? TONSILLECTOMY         Family History:  The patient's family history includes Cancer in her sister; Coronary artery disease in her father; Heart disease in her father; Mental illness in her father..    Mother- pancreatitis once to twice yearly    Medications:   Medications Prior to Admission   Medication Sig Dispense Refill Last Dose   ???  ALBUTEROL, BULK, MISC Frequency:null   Dosage:3   gm  Instructions:0.002 = gm 3 mL, inhalation, q6h, for wheezing, # 25 EA, 0, Print Requisition  Note:      ??? amLODIPine (NORVASC) 10 MG tablet Take 1 tablet (10 mg total) by mouth daily. 90 tablet 3    ??? aspirin (ECOTRIN) 81 MG tablet Take 81 mg by mouth daily.      ??? atorvastatin (LIPITOR) 40 MG tablet Take 1 tablet (40 mg total) by mouth daily. 90 tablet 3    ??? chlorthalidone (HYGROTON) 25 MG tablet Take 1 tablet (25 mg total) by mouth daily. 30 tablet 6    ??? citalopram (CELEXA) 40 MG tablet Take 1 tablet (40 mg total) by mouth daily. 90 tablet 3    ??? empty container (SHARPS CONTAINER) Misc Use as directed. 1 each 3    ??? empty container Misc Use as directed to dispose of injectable medications 1 each 3    ??? evolocumab (REPATHA SYRINGE) 140 mg/mL Syrg Inject the contents of 1 syringe (140 mg) under the skin every fourteen (14) days. 6 mL 3    ??? hydrOXYzine (ATARAX) 25 MG tablet Take 1 tablet (25 mg total) by mouth every six (6) hours as needed for anxiety and congestion. 60 tablet 3    ??? losartan (COZAAR) 100 MG tablet Take 1 tablet (100 mg total) by mouth daily. 90 tablet 3    ??? metoprolol succinate (TOPROL-XL) 100 MG 24 hr tablet Take 1 tablet (100 mg total) by mouth two (2) times a day. Repeat for elevated BP. 180 tablet 3    ??? nicotine (NICODERM CQ) 14 mg/24 hr patch Place 1 patch on the skin daily. (Patient not taking: Reported on 03/17/2019) 28 patch 2    ??? omeprazole (PRILOSEC) 20 MG capsule Take 1 capsule (20 mg total) by mouth two (2) times a day. 180 capsule 1    ??? orphenadrine (NORFLEX) 100 mg tablet Take 1 tablet (100 mg total) by mouth two (2) times a day as needed for muscle spasms. 20 tablet 0    ??? promethazine (PHENERGAN) 12.5 MG tablet Take 1 tablet (12.5 mg total) by mouth every six (6) hours as needed for nausea. 20 tablet 0    ??? traMADoL (ULTRAM) 50 mg tablet Take 1-1.5 tablets (50-75 mg total) by mouth Every six (6) hours. As needed for pain 20 tablet 0        Allergies:   Iodine and iodide containing products, Statins-hmg-coa reductase inhibitors, and Sulfa (sulfonamide antibiotics)    Social History:  Social History     Tobacco Use   ??? Smoking status: Former Smoker     Packs/day: 0.75     Types: Cigarettes     Quit date: 05/02/2019     Years since quitting: 0.0   ??? Smokeless tobacco: Never Used   ??? Tobacco comment: smokes 0.5-0.75 ppd   Substance Use Topics   ??? Alcohol use: Not Currently     Frequency: Monthly or less   ??? Drug use: Not Currently     Types: Cocaine     Comment: not used cocaine in 13 years       Objective:      Vital Signs/Weight:  Temp:  [35.8 ??C-36.8 ??C] 36.8 ??C  Heart Rate:  [59-79] 79  SpO2 Pulse:  [70-97] 97  Resp:  [17-18] 18  BP: (86-122)/(50-74) 101/51  MAP (mmHg):  [63-91] 63  SpO2:  [90 %-98 %] 94 %  BMI (Calculated):  [36.26] 36.26     Wt Readings from Last 3 Encounters:   05/06/19 (!) 105 kg (231 lb 9.5 oz)   11/24/18 (!) 102.6 kg (226 lb 3.2 oz)   11/12/18 (!) 103.9 kg (229 lb 0.9 oz)       Physical Exam: GEN: uncomfortable-appearing woman lying in bed with blankets pulled up to her neck, in some distress due to pain  EYES: EOMI, no scleral icterus  ENT: ATNC, dry mucus membranes, OP clear  NECK: supple, no LAD  CV: NRRR, normal S1/S2, no murmurs appreciated  PULM: CTAB, normal WOB on room air  ABD: soft, NTND, +BS  EXT: warm, no peripheral edema  PSYCH: awake and alert, appropriate affect  NEURO: moves all 4 extremities, answers questions appropriately  SKIN: no rashes or jaundice    Diagnostic Studies:  I reviewed all pertinent diagnostic studies, including:      Labs:    Recent Labs     05/05/19  1216   WBC 11.2*   HGB 13.9   HCT 42.3   PLT 283     Recent Labs     05/05/19  1216   NA 140   K 3.8   CL 103   BUN 16   CREATININE 0.73   GLU 117     Recent Labs     05/05/19  1216   PROT 7.1   ALBUMIN 3.9   AST 19   ALT 15   ALKPHOS 104   BILITOT 0.4     No results for input(s): INR, APTT, FIBRINOGEN in the last 72 hours.    Imaging:   MRCP 05/05/19:  - Acute necrotizing pancreatitis with a 2.9 cm acute necrotic collection within the pancreatic body.  ??  - Dilated main pancreatic duct, potentially secondary to mass effect by the acute necrotic collection.    Microbiology:  Covid negative (7/22)    GI Procedures:   ERCP 11/12/18:   - The major papilla appeared normal.                     - The patient has had a cholecystectomy.                     - A biliary sphincterotomy was performed.                     - The biliary tree was swept and sludge was found.                     - No specimens collected.

## 2019-05-06 NOTE — Unmapped (Signed)
Patient is alert and oriented x4. VSS. Afebrile. Complaints of pain. Oxycodone given with relief. Patient can ambulate independently. No falls this shift. No new skin issues.  IV abt continued. MIVF continued. Will continue to monitor.     Problem: Adult Inpatient Plan of Care  Goal: Plan of Care Review  Outcome: Progressing  Flowsheets (Taken 05/06/2019 0322)  Progress: improving  Plan of Care Reviewed With: patient  Goal: Patient-Specific Goal (Individualization)  Outcome: Progressing  Flowsheets (Taken 05/06/2019 0322)  Patient-Specific Goals (Include Timeframe): PAtient will have pain 4/10 prior to discharge on 05/09/2019  Individualized Care Needs: pain control  Anxieties, Fears or Concerns: Concerned about surgery  Goal: Absence of Hospital-Acquired Illness or Injury  Outcome: Progressing  Intervention: Identify and Manage Fall Risk  Flowsheets (Taken 05/06/2019 0322)  Safety Interventions:   environmental modification   fall reduction program maintained  Intervention: Prevent Skin Injury  Flowsheets (Taken 05/06/2019 0322)  Pressure Reduction Techniques:   frequent weight shift encouraged   weight shift assistance provided  Intervention: Prevent VTE (venous thromboembolism)  Flowsheets (Taken 05/06/2019 0322)  VTE Prevention/Management: ambulation promoted  Intervention: Prevent Infection  Flowsheets (Taken 05/06/2019 0322)  Infection Prevention:   rest/sleep promoted   single patient room provided  Goal: Optimal Comfort and Wellbeing  Outcome: Progressing  Intervention: Monitor Pain and Promote Comfort  Flowsheets (Taken 05/06/2019 0322)  Pain Management Interventions:   care clustered   pain management plan reviewed with patient/caregiver  Intervention: Provide Person-Centered Care  Flowsheets (Taken 05/06/2019 0322)  Trust Relationship/Rapport:   thoughts/feelings acknowledged   reassurance provided  Goal: Readiness for Transition of Care  Outcome: Progressing  Goal: Rounds/Family Conference  Outcome: Progressing Problem: Hypertension Comorbidity  Goal: Blood Pressure in Desired Range  Outcome: Progressing  Intervention: Maintain Hypertension-Management Strategies  Flowsheets (Taken 05/06/2019 0322)  Medication Review/Management: medications reviewed     Problem: Pain Acute  Goal: Optimal Pain Control  Intervention: Develop Pain Management Plan  Flowsheets (Taken 05/06/2019 0331)  Pain Management Interventions:   care clustered   pain management plan reviewed with patient/caregiver  Intervention: Prevent or Manage Pain  Flowsheets (Taken 05/06/2019 0331)  Sleep/Rest Enhancement:   awakenings minimized   regular sleep/rest pattern promoted  Intervention: Optimize Psychosocial Wellbeing  Flowsheets (Taken 05/06/2019 0331)  Supportive Measures:   active listening utilized   verbalization of feelings encouraged     Problem: Fluid Imbalance (Pancreatitis)  Goal: Fluid Balance  Intervention: Monitor and Manage Fluid Balance  Flowsheets (Taken 05/06/2019 0331)  Fluid/Electrolyte Management: intravenous fluids adjusted     Problem: Infection (Pancreatitis)  Goal: Infection Symptom Resolution  Intervention: Prevent or Manage Infection  Flowsheets (Taken 05/06/2019 0331)  Fever Reduction/Comfort Measures: medication administered     Problem: Pain (Pancreatitis)  Goal: Acceptable Pain Control  Intervention: Monitor and Manage Pain  Flowsheets (Taken 05/06/2019 0331)  Pain Management Interventions:   care clustered   pain management plan reviewed with patient/caregiver

## 2019-05-06 NOTE — Unmapped (Signed)
Medicine History and Physical    Assessment/Plan:    Principal Problem:    Acute pancreatitis with infected necrosis  Active Problems:    Coronary artery disease involving native coronary artery of native heart without angina pectoris    Myocardial infarction (CMS-HCC)  Resolved Problems:    * No resolved hospital problems. *      Marie Quinn is a 63 y.o. female with PMHx as reviewed in the EMR who presented to [facility] with Acute pancreatitis with infected necrosis.    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  She has had intermittent bouts of pancreatitis type pain since that admission but has not sought medical attention until 7/22 when she had ongoing epigastric pain, nausea, and vomiting for approximately 6 days.  Mild leukocytosis to 11.2.  Lipase elevated to 1914.  MRI demonstrated necrotizing pancreatitis with a 2.9 cm necrotic collection in the pancreatic body.  Other risk factors for pancreatitis include hyperlipidemia, ongoing tobacco use (intermittent since age 61).  -Bowel rest, n.p.o.  -Continue IV fluids at 125 mL/h  -Pain control, oxycodone 5 mg every 6 as needed, morphine for breakthrough pain  -Recheck TG  -Consult biliary/advanced endoscopy for potential ERCP/necrosectomy  -Continue Zosyn 4.5 g q6h  -Daily CBC w/diff    Anxiety/Depression: Continue celexa 40 mg daily  GERD: Continue protonix 20 mg in place of home prilosec  HTN: Holding Norvasc 10 mg, Losartan 100 mg, and chlorthalidoneo 25 mg in the setting of NPO, poor PO intake, and low-normal BP  HFpEF: Most recent Echo 07/2018 w/grade II diastolic dysfunction, continue Toporol-XL 100 mg  CAD: Continue aspirin  HLD: Recently started on Repatha, last dose this Monday 7/20.  Continue Lipitor.    Tobacco Abuse: Quit smoking 3 days ago. Smoked on and off since age 55. Nicotine patch.    Daily Checklist  Diet: NPO  VTE ppx: Lovenox  GI ppx: Home Use Continued  Lytes: Replete PRN  IV Access: PIV  Code status: Full  Dispo: Admit to MedW, Floor    ___________________________________________________________________    Chief Complaint:  Chief Complaint   Patient presents with   ??? Abdominal Pain     Acute pancreatitis with infected necrosis    HPI:  Marie Quinn is a 63 y.o. female with PMHx of??pancreatitis, cholecystitis (5 years ago) s/p choleycystectomy, anxiety, depression, asthma, HLD, HTN, and CAD with hx of??MI (s/p stent placement 10/2006)??presenting to the ED for??~3-4 days of??abdominal pain with associated nausea and vomiting.??    In January, 2020, patient presented to the emergency department with mild transaminitis, elevated alk phos, and bilirubin.  She underwent ERCP with sludge removal and biliary sphincterotomy with her post op course complicated by post ERCP pancreatitis.  Since that admission, the patient notes that she has had intermittent bouts of epigastric pain and pancreatitis but has not sought medical attention.    Six days ago, she developed really bad epigastric pain and back pain, again consistent with her classic symptoms of pancreatitis. She initially talked herself out of coming in to the emergency department. The pain continued for several days but subsided on Monday/Tuesday.  She has had very little p.o. intake since the onset of her pain, and also reports no bowel movement since last Thursday or Friday. Ultimately, on Wednesday, the patient had resurgence of the pain and decided to present to the [facility] ED. She  notes associated dyspepsia, regurgitation, and emesis a few times last week and a couple episodes of non-bloody, somewhat bilious vomiting.  She also endorses some ongoing weight loss, likely in the setting of decreased p.o. intake.    Upon presentation, her vital signs were stable and her labs were overall reassuring with normal hepatic function testing, and negative troponin, and a normal lactate.  She had a mild leukocytosis of 11.2 but was afebrile.  Her lipase was noted to be 1,914.  An MRI was obtained which demonstrated an acute necrotizing pancreatitis with a 2.9 cm necrotic collection within the pancreatic body.  She was also noted to have a dilated main pancreatic duct.    Upon arrival to [facility], patient was doing well.  Vital signs remained stable.    Allergies:  Iodine and iodide containing products, Statins-hmg-coa reductase inhibitors, and Sulfa (sulfonamide antibiotics)    Medications:   Prior to Admission medications    Medication Dose, Route, Frequency   ALBUTEROL, BULK, MISC Frequency:null   Dosage:3   gm  Instructions:0.002 = gm 3 mL, inhalation, q6h, for wheezing, # 25 EA, 0, Print Requisition  Note:   amLODIPine (NORVASC) 10 MG tablet 10 mg, Oral, Daily (standard)   aspirin (ECOTRIN) 81 MG tablet 81 mg, Daily (standard)   atorvastatin (LIPITOR) 40 MG tablet 40 mg, Oral, Daily (standard)   chlorthalidone (HYGROTON) 25 MG tablet 25 mg, Oral, Daily (standard)   citalopram (CELEXA) 40 MG tablet 40 mg, Oral, Daily (standard)   empty container (SHARPS CONTAINER) Misc Use as directed.   empty container Misc Use as directed to dispose of injectable medications   evolocumab (REPATHA SYRINGE) 140 mg/mL Syrg Inject the contents of 1 syringe (140 mg) under the skin every fourteen (14) days.   hydrOXYzine (ATARAX) 25 MG tablet Take 1 tablet (25 mg total) by mouth every six (6) hours as needed for anxiety and congestion.   losartan (COZAAR) 100 MG tablet 100 mg, Oral, Daily (standard)   metoprolol succinate (TOPROL-XL) 100 MG 24 hr tablet 100 mg, Oral, 2 times a day, Repeat for elevated BP.   nicotine (NICODERM CQ) 14 mg/24 hr patch 1 patch, Transdermal, Every 24 hours  Patient not taking: Reported on 03/17/2019   omeprazole (PRILOSEC) 20 MG capsule 20 mg, Oral, 2 times a day   orphenadrine (NORFLEX) 100 mg tablet 100 mg, Oral, 2 times a day PRN   promethazine (PHENERGAN) 12.5 MG tablet 12.5 mg, Oral, Every 6 hours PRN   traMADoL (ULTRAM) 50 mg tablet 50-75 mg, Oral, Every 6 hours, As needed for pain       Medical History:  Past Medical History:   Diagnosis Date   ??? Anxiety    ??? Asthma    ??? BPPV (benign paroxysmal positional vertigo)    ??? Depression    ??? Fibromyalgia    ??? Heart disease    ??? Hyperlipidemia    ??? Hypertension    ??? Myocardial infarction (CMS-HCC) 2008    s/p stent placement x 1 at [facility]   ??? Panic disorder    ??? Urge incontinence        Surgical History:  Past Surgical History:   Procedure Laterality Date   ??? cardiac stent x 1     ??? CHOLECYSTECTOMY     ??? PR ERCP,SPHINCTEROTOMY N/A 11/12/2018    Procedure: ERCP; W/SPHINCTEROTOMY/PAPILLOTOMY;  Surgeon: Vonda Antigua, MD;  [facility];  Service: Gastroenterology   ??? PR ERCP,W/REMOVAL STONE,BIL/PANCR DUCTS N/A  11/12/2018    Procedure: ERCP; W/ENDOSCOPIC RETROGRADE REMOVAL OF CALCULUS/CALCULI FROM BILIARY &/OR PANCREATIC DUCTS;  Surgeon: Vonda Antigua, MD;  Location: [facility];  Service: Gastroenterology   ??? TONSILLECTOMY         Social History:  Social History     Socioeconomic History   ??? Marital status: Widowed     Spouse name: Not on file   ??? Number of children: Not on file   ??? Years of education: Not on file   ??? Highest education level: Not on file   Occupational History   ??? Not on file   Social Needs   ??? Financial resource strain: Not hard at all   ??? Food insecurity     Worry: Never true     Inability: Never true   ??? Transportation needs     Medical: No     Non-medical: No   Tobacco Use   ??? Smoking status: Former Smoker     Packs/day: 0.75     Types: Cigarettes     Quit date: 05/02/2019     Years since quitting: 0.0   ??? Smokeless tobacco: Never Used   ??? Tobacco comment: smokes 0.5-0.75 ppd   Substance and Sexual Activity   ??? Alcohol use: Not Currently     Frequency: Monthly or less   ??? Drug use: Not Currently     Types: Cocaine     Comment: not used cocaine in 13 years ??? Sexual activity: Not Currently     Partners: Male     Birth control/protection: Coitus interruptus   Lifestyle   ??? Physical activity     Days per week: 0 days     Minutes per session: 0 min   ??? Stress: Very much   Relationships   ??? Social Wellsite geologist on phone: More than three times a week     Gets together: More than three times a week     Attends religious service: Never     Active member of club or organization: Yes     Attends meetings of clubs or organizations: 1 to 4 times per year     Relationship status: Living with partner   Other Topics Concern   ??? Not on file   Social History Narrative   ??? Not on file       Family History:  Family History   Problem Relation Age of Onset   ??? Heart disease Father    ??? Coronary artery disease Father         quadruple bypass   ??? Mental illness Father    ??? Cancer Sister    ??? Substance Abuse Disorder Neg Hx    ??? Alcohol abuse Neg Hx    ??? Drug abuse Neg Hx        Review of Systems:  10 systems reviewed and are negative unless otherwise mentioned in HPI    Labs/Studies:  Labs and Studies from the last 24hrs per EMR and Reviewed    Physical Exam:  Temp:  [35.8 ??C-36.8 ??C] 36.7 ??C  Heart Rate:  [59-70] 64  SpO2 Pulse:  [70-97] 97  Resp:  [17-18] 18  BP: (106-122)/(54-74) 107/54  SpO2:  [92 %-98 %] 92 %    General appearance - Well appearing, pleasant, cooperative  Mental status - Alert and oriented to person, place and time.   Head - NCAT, EOMI  Chest - clear to auscultation bilaterally, no wheezes,  rales or rhonchi  Heart - normal rate, regular rhythm, no murmurs  Abdomen - soft, diffuse tenderness worse in epigastrum  Neurological - no focal neurological deficits  Musculoskeletal - no joint tenderness, deformity or swelling  Extremities - peripheral pulses normal, no pedal edema, no clubbing or cyanosis  Skin - normal coloration, no rashes or lesions noted

## 2019-05-06 NOTE — Unmapped (Addendum)
Marie Quinn is a 63 y.o. female with PMHx as reviewed in the EMR who presented to [facility] with Acute pancreatitis with infected necrosis. She was given supportive care and GI consulted.  No interventions needed and now tolerating orals with pain controlled.  She will dc home with PCP and GI follow up.  Below are more details of her stay at [facility] according to problem:   ??  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  Mild leukocytosis to 11.2.  Lipase elevated to 1914.  MRI demonstrated necrotizing pancreatitis with a 2.9 cm necrotic collection in the pancreatic body.  Bowel rest and IVFluids with pain control.  GI consulted and followed during stay with labs stable and no interventions recommended at this time.  Her pain is controlled with oral opioids and she is tolerating orals.  Plan is for outpatient GI follow up in 2 months.  ??  Anxiety/Depression: Continue celexa 40 mg daily  GERD: Continue protonix 20 mg in place of home prilosec  HTN: Initially held Norvasc 10 mg, Losartan 100 mg, and chlorthalidone 25 mg in the setting of NPO, poor PO intake, and low-normal BP. Hold chlorthalidone until follow up with PCP.  HFpEF: Most recent Echo 07/2018 w/grade II diastolic dysfunction, continue Toporol-XL 100 mg  CAD: Continue aspirin  HLD: Recently started on Repatha, last dose this Monday 7/20.  Continue Lipitor.    Tobacco Abuse: Quit smoking 3 days ago. Smoked on and off since age 47. Nicotine patch.

## 2019-05-06 NOTE — Unmapped (Signed)
Care Management  Initial Transition Planning Assessment      Marie Quinn is a 63 y.o. female with PMHx as reviewed in the EMR who presented to [facility] with Acute pancreatitis with infected necrosis.    Pt did not wear a mask throughout interview, SW maintained social distance and wore a mask throughout interview.                General  Care Manager assessed the patient by : In person interview with patient  Orientation Level: Oriented X4  Who provides care at home?: N/A  Reason for referral: Discharge Planning    Contact/[facility]  Extended Emergency Contact Information  Primary Emergency Contact: Rice,Stanley  Address: [address]           [place], [place] [address] [place]  Home Phone: [phone]  Relation: Friend  Secondary Emergency Contact: Caton,Valarie   [place]  Home Phone: [phone]  Relation: Daughter    Legal Next of Kin / Guardian / POA / Advance Directives     HCDM (patient stated preference): Caton,Valarie - Daughter - [phone]    Advance Directive (Medical Treatment)  Does patient have an advance directive covering medical treatment?: Patient does not have advance directive covering medical treatment.  Reason patient does not have an advance directive covering medical treatment:: Patient does not wish to complete one at this time.    Health Care Decision Maker [HCDM] (Medical & Mental Health Treatment)  Healthcare Decision Maker: Patient needs follow-up to appoint a Health Care Decision Maker.  Information offered on HCDM, Medical & Mental Health advance directives:: Patient given information.         Patient Information  Lives with: Spouse/significant other    Type of Residence: Private residence        Location/Detail: [address] [place] [address] phone [phone]     Support Systems: Family Members     Pt lives with her BF Sharrell Ku [phone] and her dtr Richarda Osmond lives across the street from her .     Her cell phone is currently not working and home phone is best way to reach pt .     Her dtr is her emergency contact and her BF MR Rice is her second emergency contact    She will have a ride home.     Responsibilities/Dependents at home?: No    Home Care services in place prior to admission?: No                          Currently receiving outpatient dialysis?: No       Financial Information       Need for financial assistance?: No       Social Determinants of Health  Social History     Socioeconomic History   ??? Marital status: Widowed     Spouse name: None   ??? Number of children: None   ??? Years of education: None   ??? Highest education level: None   Occupational History   ??? None   Social Needs   ??? Financial resource strain: Not hard at all   ??? Food insecurity     Worry: Never true     Inability: Never true   ??? Transportation needs     Medical: No     Non-medical: No   Tobacco Use   ??? Smoking status: Former Smoker  Packs/day: 0.75     Types: Cigarettes     Quit date: 05/02/2019     Years since quitting: 0.0   ??? Smokeless tobacco: Never Used   ??? Tobacco comment: smokes 0.5-0.75 ppd   Substance and Sexual Activity   ??? Alcohol use: Not Currently     Frequency: Monthly or less   ??? Drug use: Not Currently     Types: Cocaine     Comment: not used cocaine in 13 years   ??? Sexual activity: Not Currently     Partners: Male     Birth control/protection: Coitus interruptus   Lifestyle   ??? Physical activity     Days per week: 0 days     Minutes per session: 0 min   ??? Stress: Very much   Relationships   ??? Social Wellsite geologist on phone: More than three times a week     Gets together: More than three times a week     Attends religious service: Never     Active member of club or organization: Yes     Attends meetings of clubs or organizations: 1 to 4 times per year     Relationship status: Living with partner   Other Topics Concern   ??? None   Social History Narrative   ??? None     Housing/Utilities   ??? Within the past 12 months, have you ever stayed: outside, in a car, in a tent, in an overnight shelter, or temporarily in someone else's home (i.e. couch-surfing)? No    ??? Are you worried about losing your housing? No    ??? Within the past 12 months, have you been unable to get utilities (heat, electricity) when it was really needed? No      Literacy   ??? How often do you need to have someone help you when you read instructions, pamphlets, or other written material from your doctor or pharmacy? Never        Discharge Needs Assessment  Concerns to be Addressed: no discharge needs identified    Clinical Risk Factors: Multiple Diagnoses (Chronic)    Barriers to taking medications: No    Prior overnight hospital stay or ED visit in last 90 days: No    Readmission Within the Last 30 Days: no previous admission in last 30 days         Anticipated Changes Related to Illness: other (see comments) will follow for any discharge planning - pt has been independent prior to admission, does not use any Adaptive equipment. Still drives.     Equipment Needed After Discharge: other (see comments) will follow to assess. Pt does not have any DME provided to her under her insurance policy but she has access to a lot of DME in a shed on our property     Discharge Facility/Level of Care Needs:      Readmission  Risk of Unplanned Readmission Score: UNPLANNED READMISSION SCORE: 10%  Predictive Model Details           9% (Low) Factors Contributing to Score   Calculated 05/06/2019 15:38 34% Number of active Rx orders is 28   [place] Risk of Unplanned Readmission Model 20% Number of ED visits in last six months is 2     12% Imaging order is present in last 6 months     10% Charlson Comorbidity Index is 5     9% Age is 63     9% Number  of hospitalizations in last year is 1     3% Future appointment is scheduled     2% Active ulcer medication Rx order is present     Readmitted Within the Last 30 Days? (No if blank)   Patient at risk for readmission?: No    Discharge Plan  Screen findings are: Care Manager reviewed the plan of the patient's care with the Multidisciplinary Team. No discharge planning needs identified at this time. Care Manager will continue to manage plan and monitor patient's progress with the team.    Expected Discharge Date: 05/08/2019    Expected Transfer from Critical Care: 05/08/19    Patient and/or family were provided with choice of facilities / services that are available and appropriate to meet post hospital care needs?: N/A       Initial Assessment complete?: Yes

## 2019-05-07 LAB — CBC
HEMATOCRIT: 34.3 % — ABNORMAL LOW (ref 36.0–46.0)
MEAN CORPUSCULAR HEMOGLOBIN CONC: 32.4 g/dL (ref 31.0–37.0)
MEAN CORPUSCULAR HEMOGLOBIN: 28.3 pg (ref 26.0–34.0)
MEAN CORPUSCULAR VOLUME: 87.5 fL (ref 80.0–100.0)
MEAN PLATELET VOLUME: 8.5 fL (ref 7.0–10.0)
PLATELET COUNT: 227 10*9/L (ref 150–440)
RED BLOOD CELL COUNT: 3.92 10*12/L — ABNORMAL LOW (ref 4.00–5.20)
RED CELL DISTRIBUTION WIDTH: 14.5 % (ref 12.0–15.0)
WBC ADJUSTED: 7.2 10*9/L (ref 4.5–11.0)

## 2019-05-07 LAB — COMPREHENSIVE METABOLIC PANEL
ALBUMIN: 3 g/dL — ABNORMAL LOW (ref 3.5–5.0)
ALKALINE PHOSPHATASE: 91 U/L (ref 38–126)
ALT (SGPT): 14 U/L (ref <35)
ANION GAP: 11 mmol/L (ref 7–15)
AST (SGOT): 20 U/L (ref 14–38)
BILIRUBIN TOTAL: 0.5 mg/dL (ref 0.0–1.2)
BUN / CREAT RATIO: 19
CALCIUM: 8.7 mg/dL (ref 8.5–10.2)
CHLORIDE: 96 mmol/L — ABNORMAL LOW (ref 98–107)
CO2: 30 mmol/L (ref 22.0–30.0)
CREATININE: 0.54 mg/dL — ABNORMAL LOW (ref 0.60–1.00)
EGFR CKD-EPI AA FEMALE: >90 mL/min/{1.73_m2} (ref >=60)
EGFR CKD-EPI NON-AA FEMALE: >90 mL/min/{1.73_m2} (ref >=60)
POTASSIUM: 3.7 mmol/L (ref 3.5–5.0)
PROTEIN TOTAL: 6.2 g/dL — ABNORMAL LOW (ref 6.5–8.3)
SODIUM: 137 mmol/L (ref 135–145)

## 2019-05-07 LAB — SODIUM: Sodium:SCnc:Pt:Ser/Plas:Qn:: 137

## 2019-05-07 LAB — HEMOGLOBIN: Hemoglobin:MCnc:Pt:Bld:Qn:: 11.1 — ABNORMAL LOW

## 2019-05-07 NOTE — Unmapped (Addendum)
Pt is AAOx4. She is independent with ADLs. Pt voiced pain mgmt this shift. Pain has been managed 3x thus far, w/ adequate relief. Pt rec'd med for anxiety w/ effective results. Pt became NPO at midnight for procedure tomorrow, she is aware & verbalized understanding. She continues on IVF, tolerating well.  VSS.     At around 0550, pt had an emesis episode, Zofran was given w/ adequate relief.     Problem: Adult Inpatient Plan of Care  Goal: Plan of Care Review  Flowsheets (Taken 05/07/2019 0322)  Progress: improving  Plan of Care Reviewed With: patient  Goal: Patient-Specific Goal (Individualization)  Flowsheets (Taken 05/07/2019 0320)  Patient-Specific Goals (Include Timeframe): Pt will have adequate pain mgmt throughout shift.  Individualized Care Needs: Will monitor VS, administer meds, NPO @ MN, con IVF, & pain mgmt  Anxieties, Fears or Concerns: Pt voiced pain mgmt  Goal: Absence of Hospital-Acquired Illness or Injury  Intervention: Identify and Manage Fall Risk  Flowsheets (Taken 05/07/2019 0320)  Safety Interventions:  ??? fall reduction program maintained  ??? lighting adjusted for tasks/safety  ??? low bed  Intervention: Prevent Skin Injury  Flowsheets (Taken 05/07/2019 0320)  Pressure Reduction Techniques: frequent weight shift encouraged  Intervention: Prevent VTE (venous thromboembolism)  Flowsheets (Taken 05/07/2019 0318)  VTE Prevention/Management:  ??? ambulation promoted  ??? intravenous hydration  Intervention: Prevent Infection  Flowsheets (Taken 05/07/2019 0318)  Infection Prevention:  ??? rest/sleep promoted  ??? handwashing promoted  Goal: Optimal Comfort and Wellbeing  Intervention: Monitor Pain and Promote Comfort  Flowsheets (Taken 05/07/2019 0318)  Pain Management Interventions:  ??? care clustered  ??? pain management plan reviewed with patient/caregiver  ??? quiet environment facilitated  Intervention: Provide Person-Centered Care  Flowsheets (Taken 05/07/2019 0318)  Trust Relationship/Rapport:  ??? care explained  ??? thoughts/feelings acknowledged  ??? choices provided  ??? questions answered     Problem: Hypertension Comorbidity  Goal: Blood Pressure in Desired Range  Intervention: Maintain Hypertension-Management Strategies  Flowsheets (Taken 05/07/2019 0318)  Medication Review/Management: medications reviewed     Problem: Pain Acute  Goal: Optimal Pain Control  Intervention: Develop Pain Management Plan  Flowsheets (Taken 05/07/2019 0318)  Pain Management Interventions:  ??? care clustered  ??? pain management plan reviewed with patient/caregiver  ??? quiet environment facilitated  Intervention: Prevent or Manage Pain  Flowsheets (Taken 05/07/2019 0318)  Sensory Stimulation Regulation:  ??? quiet environment promoted  ??? lighting decreased  ??? care clustered  Sleep/Rest Enhancement:  ??? awakenings minimized  ??? regular sleep/rest pattern promoted  Intervention: Optimize Psychosocial Wellbeing  Flowsheets (Taken 05/07/2019 0318)  Supportive Measures:  ??? active listening utilized  ??? verbalization of feelings encouraged  ??? self-care encouraged  Diversional Activities: television     Problem: Fluid Imbalance (Pancreatitis)  Goal: Fluid Balance  Intervention: Monitor and Manage Fluid Balance  Flowsheets (Taken 05/07/2019 0318)  Fluid/Electrolyte Management:  ??? intravenous fluids adjusted  ??? fluids provided     Problem: Infection (Pancreatitis)  Goal: Infection Symptom Resolution  Intervention: Prevent or Manage Infection  Flowsheets (Taken 05/07/2019 0318)  Infection Management: aseptic technique maintained  Fever Reduction/Comfort Measures: medication administered     Problem: Pain (Pancreatitis)  Goal: Acceptable Pain Control  Intervention: Monitor and Manage Pain  Flowsheets (Taken 05/07/2019 0318)  Pain Management Interventions:  ??? care clustered  ??? pain management plan reviewed with patient/caregiver  ??? quiet environment facilitated  Diversional Activities: television     Problem: Oral Intake Inadequate (Heart Failure)  Goal: Optimal Nutrition Intake  Intervention: Promote and Optimize Nutrition Intake  Flowsheets (Taken 05/07/2019 0318)  Oral Nutrition Promotion: rest periods promoted

## 2019-05-07 NOTE — Unmapped (Signed)
Daily Progress Note    24hr Events:   -Biliary/advanced endoscopy NOT going to do procedure --> will f/u in 2 mo  -D/Ced IVFs  -Transitioning from IV pain/nausea control to PO  -Working on advancing diet to ensure PO tolerance prior to d/c    Assessment/Plan:    Marie Quinn is a 64 y.o. 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    Principal Problem:    Acute pancreatitis with infected necrosis  Active Problems:    Coronary artery disease involving native coronary artery of native heart without angina pectoris    Hypertension    Anxiety    Depression    Tobacco abuse    Chronic diastolic CHF (congestive heart failure) (CMS-HCC)  Resolved Problems:    * No resolved hospital problems. *    Acute Necrotizing Pancreatitis:   Patient has a history of pancreatitis, first noted in January 2020 when she developed post ERCP pancreatitis following a biliary sphincterotomy and sludge removal.  She has had intermittent bouts of pancreatitis type pain since that admission but has not sought medical attention until 7/22 when she had ongoing epigastric pain, nausea, and vomiting for approximately 6 days.  Mild leukocytosis to 11.2.  Lipase elevated to 1914.  MRI demonstrated necrotizing pancreatitis with a 2.9 cm necrotic collection in the pancreatic body. Low suspicion for infected necrotic collection (afebrile, mild leukocytosis, benign abd exam) so antibiotics discontinued. Unknown etiology of recurrent episodes. No signs of gallstones/biliary obstruction (normal hep function labs), minimal EtOH use, normal TGs and Ca. Possibly 2/2 Thiazide (holding now, will discontinue on d/c). Interestingly, pt reports mom has recurrent pancreatitis raising suspicion for inherited cause.   Biliary/advanced endoscopy decided against intervention this admission and will f/u in 2 months for further eval and management. Working on transitioning IV pain/nausea meds to PO and advancing diet as tolerated.  -Nutrition: trial of PO liquids today, ADAT  -Fluids: discontinued   -Pain: dilaudid PO 1mg  Q4hrs PRN  -Nausea: zofran PO 8mg  Q8hrs PRN  -Daily CBC  -do NOT restart Thiazide on d/c due to potential cause of recurrent pancreatitis      Anxiety/Depression: Continue celexa 40 mg daily  GERD: Continue protonix 20 mg in place of home prilosec  HTN: Holding Norvasc 10 mg, Losartan 100 mg, and chlorthalidoneo 25 mg in the setting of NPO, poor PO intake, and low-normal BP  HFpEF: Most recent Echo 07/2018 w/grade II diastolic dysfunction, continue Toporol-XL 100 mg  CAD: Continue aspirin  HLD: Recently started on Repatha, last dose this Monday 7/20.  Continue Lipitor.    Tobacco Abuse: Quit smoking 3 days ago. Smoked on and off since age 14. Nicotine patch.     Daily Checklist  Diet: CLD, ADAT  VTE ppx: SCDs  GI ppx: Home Use Continued  Lytes: Replete PRN  IV Access: PIV  Code status: Full  Dispo: Floor, anticipated d/c 7/25  ___________________________________________________________________    Subjective:  Severe pain overnight requiring IV Morphine. Otherwise reports feeling very sleepy this AM.    Labs/Studies:  Labs and Studies from the last 24hrs per EMR and Reviewed      Objective:  BP 100/74  - Pulse 59  - Temp 37 ??C (98.6 ??F) (Oral)  - Resp 18  -  Ht 170.2 cm (5' 7)  - Wt (!) 105 kg (231 lb 9.5 oz)  - SpO2 96%  - BMI 36.27 kg/m??     GEN: alert and oriented, NAD, sitting in bed comfortably, pleasant and conversant    PULM: no increased WOB, CTAB  CV: RRR, no murmurs appreciated  ABD: soft, non-distended, mildly TTP in bilateral lower quadrants     On my exam patient awake, alert, no distress. RRR. Normal WOB on room air and lungs with faint inspiratory crackles over RLL. Extremities warm, no edema. Abdomen soft, tender most over RLL.     I attest that I have reviewed the student note and that the components of the history of present illness, the physical exam, and the assessment and plan documented were performed by me or were performed in my presence by the student where I verified the documentation and performed (or re-performed) the exam and medical decision making.  -Denton Meek, May 07, 2019 5:23 PM    Jamal Collin, MD  Internal Medicine & Pediatrics, PGY-4  [facility]  Pager: [phone]

## 2019-05-07 NOTE — Unmapped (Signed)
Patient alert and oriented x 3 this shift.  Patient ambulatory and walks well in hallway.  Patient plagued byPain and nausea this shift.  Patient NPO and then changed to clear liquid diet.  LR continued this shift.  Patient having adequate output.  No needs at this time.     Problem: Adult Inpatient Plan of Care  Goal: Plan of Care Review  Outcome: Ongoing - Unchanged  Goal: Patient-Specific Goal (Individualization)  Outcome: Ongoing - Unchanged  Flowsheets (Taken 05/06/2019 2005)  Patient-Specific Goals (Include Timeframe): Patient will report a reduction in pain by the end of hospital stay.  Individualized Care Needs: Pain control, Nausea  Anxieties, Fears or Concerns: Patient conplains of pain mgt  Goal: Absence of Hospital-Acquired Illness or Injury  Outcome: Ongoing - Unchanged  Goal: Optimal Comfort and Wellbeing  Outcome: Ongoing - Unchanged  Goal: Readiness for Transition of Care  Outcome: Ongoing - Unchanged  Goal: Rounds/Family Conference  Outcome: Ongoing - Unchanged

## 2019-05-07 NOTE — Unmapped (Signed)
BILIARY INPATIENT FOLLOW UP NOTE     Requesting Attending Physician:  Clayborne Artist Jory Sims,*    Reason for Consult:  KIMELA MALSTROM is a 63 y.o. female seen in consultation at the request of Dr. Clayborne Artist Jory Sims,* for necrotizing pancreatitis.    Interval History:  - states she felt like I overdosed on oxycodone yesterday  - tells me she threatened to leave overnight if she was not given something else for her pain and was subsequently given IV morphine- has received 4 doses in the past 24 hours  - has zero energy this morning and continues to have a headache but then goes on to question if it could be mental  - does state that her epigastric pain has resolved with morphine, but she now has lower abdominal pain  - continues to have some nausea but says she is 40% better since admission  - reports no bowel movement in at least 6 days      ASSESSMENT / PLAN:     Keirstan Iannello is a 63yo female with PMH abnormal liver tests (2/2 DILI vs biliary sludge), post-ERCP pancreatitis (10/2018), obesity, CAD c/b MI s/p PCI to distal LAD (2008), HTN, HLD, OSA, tobacco use disorder, HFpEF, and s/p cholecystectomy who presents with acute necrotizing pancreatitis. Her imaging demonstrates an acute necrotic collection (2.9cm) within the pancreatic body and a dilated main pancreatic duct.     Ms. Towles has remained hemodynamically stable and afebrile overnight off antibiotics. She is well-appearing this morning, conversant, and without abdominal tenderness on exam. She reports resolution of her prior epigastric pain, but does complain of some lower abdominal pain, which I suspect is related to constipation. No new labs this morning. She appears to be clinically improving from her pancreatitis, so would not recommend ERCP at this time.      Recommendations:  - trial of PO liquids today, advance diet as tolerated  - switch to PO pain control and anti-emetics if able  - start miralax for constipation  - we will arrange follow-up in GI clinic in 2 months      GI will sign off. Please page [phone] for further questions/concerns. Patient discussed with Drs. Hathorn and Gangarosa.      Tammi Sou, MD  Gastroenterology & Hepatology Fellow, PGY-4  [employer] [facility]           Medications:  Current Facility-Administered Medications   Medication Dose Route Frequency Provider Last Rate Last Dose   ??? aspirin chewable tablet 81 mg  81 mg Oral Daily Johnney Ou, MD   81 mg at 05/06/19 4540   ??? atorvastatin (LIPITOR) tablet 40 mg  40 mg Oral Nightly Johnney Ou, MD   40 mg at 05/06/19 2026   ??? citalopram (CeleXA) tablet 40 mg  40 mg Oral Daily Johnney Ou, MD   40 mg at 05/06/19 0902   ??? hydroxyzine (ATARAX) capsule/tablet 25 mg  25 mg Oral Q6H PRN Johnney Ou, MD   25 mg at 05/06/19 2030   ??? lactated Ringers infusion  200 mL/hr Intravenous Continuous Mervin Kung, ANP 200 mL/hr at 05/07/19 0335 200 mL/hr at 05/07/19 0335   ??? metoprolol succinate (TOPROL-XL) 24 hr tablet 100 mg  100 mg Oral BID Johnney Ou, MD   100 mg at 05/06/19 2026   ??? MORPhine 4 mg/mL injection 2 mg  2 mg Intravenous Q3H PRN Denton Meek, MD  Or   ??? MORPhine 4 mg/mL injection 4 mg  4 mg Intravenous Q3H PRN Denton Meek, MD   4 mg at 05/07/19 0259   ??? nicotine (NICODERM CQ) 14 mg/24 hr patch 1 patch  1 patch Transdermal Q24H Johnney Ou, MD   1 patch at 05/07/19 [phone]   ??? ondansetron (ZOFRAN) injection 4 mg  4 mg Intravenous Q6H PRN Al Pimple, MD   4 mg at 05/07/19 9604   ??? pantoprazole (PROTONIX) EC tablet 20 mg  20 mg Oral Daily Johnney Ou, MD   20 mg at 05/06/19 0902       Vital Signs:  Temp:  [36.2 ??C-37.9 ??C] 36.2 ??C  Heart Rate:  [68-84] 84  Resp:  [18-19] 19  BP: (106-121)/(44-64) 106/55  MAP (mmHg):  [63-76] 76  SpO2:  [90 %-97 %] 92 %    Intake/Output last 3 shifts:  I/O last 3 completed shifts:  In: -   Out: 1801 [Urine:1800; Emesis/NG output:1]    Physical Exam:  Constitutional: Well-appearing woman sitting up in bed with emesis basin covering her face   HEENT: PEERL, no sleral icterus  CV: Normal rate, regular rhythm  Pulmonary: Normal work of breathing on room air  Abdomen: Soft, non-distended, non-tender to palpation  Skin: No rashes, jaundice or skin lesions noted  Extremities: Warm, no edema  Neuro: Alert, Oriented x 3, No focal deficits    Diagnostic Studies:   Labs:  Recent Labs     05/05/19  1216   WBC 11.2*   HGB 13.9   HCT 42.3   PLT 283     Recent Labs     05/05/19  1216   NA 140   K 3.8   CL 103   BUN 16   CREATININE 0.73   GLU 117     Recent Labs     05/05/19  1216   PROT 7.1   ALBUMIN 3.9   AST 19   ALT 15   ALKPHOS 104   BILITOT 0.4     Recent Labs     05/06/19  0736   INR 1.14       Imaging/Procedures:  Imaging:   MRCP 05/05/19:  - Acute necrotizing pancreatitis with a 2.9 cm acute necrotic collection within the pancreatic body.  ??  - Dilated main pancreatic duct, potentially secondary to mass effect by the acute necrotic collection.  ??  GI Procedures:   ERCP 11/12/18:  ??- The major papilla appeared normal.  ??????????????????????????????????????- The patient has had a cholecystectomy.  ??????????????????????????????????????- A biliary sphincterotomy was performed.  ??????????????????????????????????????- The biliary tree was swept and sludge was found.  ??????????????????????????????????????- No specimens collected.

## 2019-05-08 LAB — COMPREHENSIVE METABOLIC PANEL
ALBUMIN: 3.1 g/dL — ABNORMAL LOW (ref 3.5–5.0)
ALT (SGPT): 13 U/L (ref <35)
ANION GAP: 5 mmol/L — ABNORMAL LOW (ref 7–15)
AST (SGOT): 23 U/L (ref 14–38)
BILIRUBIN TOTAL: 0.3 mg/dL (ref 0.0–1.2)
BLOOD UREA NITROGEN: 14 mg/dL (ref 7–21)
BUN / CREAT RATIO: 24
CALCIUM: 8.8 mg/dL (ref 8.5–10.2)
CHLORIDE: 99 mmol/L (ref 98–107)
CO2: 34 mmol/L — ABNORMAL HIGH (ref 22.0–30.0)
EGFR CKD-EPI AA FEMALE: >90 mL/min/{1.73_m2} (ref >=60)
EGFR CKD-EPI NON-AA FEMALE: >90 mL/min/{1.73_m2} (ref >=60)
GLUCOSE RANDOM: 121 mg/dL (ref 70–179)
POTASSIUM: 3.7 mmol/L (ref 3.5–5.0)
PROTEIN TOTAL: 6.3 g/dL — ABNORMAL LOW (ref 6.5–8.3)
SODIUM: 138 mmol/L (ref 135–145)

## 2019-05-08 LAB — CBC
HEMOGLOBIN: 11.4 g/dL — ABNORMAL LOW (ref 12.0–16.0)
MEAN CORPUSCULAR HEMOGLOBIN CONC: 31.8 g/dL (ref 31.0–37.0)
MEAN CORPUSCULAR HEMOGLOBIN: 27.8 pg (ref 26.0–34.0)
MEAN CORPUSCULAR VOLUME: 87.5 fL (ref 80.0–100.0)
MEAN PLATELET VOLUME: 8.4 fL (ref 7.0–10.0)
PLATELET COUNT: 235 10*9/L (ref 150–440)
RED BLOOD CELL COUNT: 4.09 10*12/L (ref 4.00–5.20)
RED CELL DISTRIBUTION WIDTH: 14.5 % (ref 12.0–15.0)
WBC ADJUSTED: 7.8 10*9/L (ref 4.5–11.0)

## 2019-05-08 LAB — CO2: Carbon dioxide:SCnc:Pt:Ser/Plas:Qn:: 34 — ABNORMAL HIGH

## 2019-05-08 LAB — RED CELL DISTRIBUTION WIDTH: Lab: 14.5

## 2019-05-08 MED ORDER — ONDANSETRON 8 MG DISINTEGRATING TABLET
ORAL_TABLET | Freq: Three times a day (TID) | ORAL | 0 refills | 7.00000 days | Status: CP | PRN
Start: 2019-05-08 — End: 2019-06-01
  Filled 2019-05-08: qty 21, 7d supply, fill #0

## 2019-05-08 MED ORDER — HYDROMORPHONE 2 MG TABLET
ORAL_TABLET | ORAL | 0 refills | 7.00000 days | Status: CP | PRN
Start: 2019-05-08 — End: 2019-05-15
  Filled 2019-05-08: qty 20, 7d supply, fill #0

## 2019-05-08 MED ORDER — POLYETHYLENE GLYCOL 3350 17 GRAM/DOSE ORAL POWDER
Freq: Every day | ORAL | 0 refills | 14.00000 days | Status: CP
Start: 2019-05-08 — End: 2019-06-10
  Filled 2019-05-08: qty 238, 14d supply, fill #0

## 2019-05-08 MED FILL — HYDROMORPHONE 2 MG TABLET: 7 days supply | Qty: 20 | Fill #0 | Status: AC

## 2019-05-08 MED FILL — POLYETHYLENE GLYCOL 3350 17 GRAM/DOSE ORAL POWDER: 14 days supply | Qty: 238 | Fill #0 | Status: AC

## 2019-05-08 MED FILL — ONDANSETRON 8 MG DISINTEGRATING TABLET: 7 days supply | Qty: 21 | Fill #0 | Status: AC

## 2019-05-08 NOTE — Unmapped (Signed)
Physician Discharge Summary [facility]  [address] [facility]  [address]  [place] [address]  Dept: [phone]  Loc: [phone]     Identifying Information:   Marie Quinn  June 08, 1956  [id number]    Primary Care Physician: Olena Leatherwood, NP   Code Status: Full Code    Admit Date: 05/05/2019    Discharge Date: 05/08/2019     Discharge To: Home    Discharge Service: GEN Med U Teaching (MDU)     Discharge Attending Physician: Berenda Morale, MD    Discharge Diagnoses:  Principal Problem:    Acute pancreatitis with infected necrosis  Active Problems:    Coronary artery disease involving native coronary artery of native heart without angina pectoris    Hypertension    Anxiety    Depression    Tobacco abuse    Chronic diastolic CHF (congestive heart failure) (CMS-HCC)  Resolved Problems:    * No resolved hospital problems. *      Outpatient Provider Follow Up Issues:   Patient was admitted to [facility] for necrotizing pancreatitis, responded well to supportive care and was followed by our GI doctors inpatient. She presented with hypotension in the setting of pancreatitis, and due to the suspicion that her thiazide antihypertensive possibly contributed, her thiazide was discontinued and not resumed on discharge. PCP to resume as necessary.    Hospital Course:   Marie Quinn is a 63 y.o. female with PMHx as reviewed in the EMR who presented to [facility] with Acute pancreatitis with infected necrosis. She was given supportive care and GI consulted.  No interventions needed and now tolerating orals with pain controlled.  She will dc home with PCP and GI follow up.  Below are more details of her stay at [facility] according to problem:   ??  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  Mild leukocytosis to 11.2.  Lipase elevated to 1914.  MRI demonstrated necrotizing pancreatitis with a 2.9 cm necrotic collection in the pancreatic body.  Bowel rest and IVFluids with pain control.  GI consulted and followed during stay with labs stable and no interventions recommended at this time.  Her pain is controlled with oral opioids and she is tolerating orals.  Plan is for outpatient GI follow up in 2 months.  ??  Anxiety/Depression: Continue celexa 40 mg daily  GERD: Continue protonix 20 mg in place of home prilosec  HTN: Initially held Norvasc 10 mg, Losartan 100 mg, and chlorthalidone 25 mg in the setting of NPO, poor PO intake, and low-normal BP. Hold chlorthalidone until follow up with PCP.  HFpEF: Most recent Echo 07/2018 w/grade II diastolic dysfunction, continue Toporol-XL 100 mg  CAD: Continue aspirin  HLD: Recently started on Repatha, last dose this Monday 7/20.  Continue Lipitor.    Tobacco Abuse: Quit smoking 3 days ago. Smoked on and off since age 44. Nicotine patch.  ??    Touchbase with Outpatient Provider:  Warm Handoff: Completed on 05/08/19 by Al Pimple  (Intern) via Epic Secure Chat    Procedures:  None  ______________________________________________________________________  Discharge Medications:     Your Medication List      STOP taking these medications    chlorthalidone 25 MG tablet  Commonly known as: HYGROTON        START taking these medications    HYDROmorphone 2 MG tablet  Commonly known as: DILAUDID  Take 0.5 tablets (1 mg total) by mouth every four (4) hours as needed for pain,moderate (4-6) for up to 5 days.     ondansetron 8 MG disintegrating tablet  Commonly known as: ZOFRAN-ODT  Take 1 tablet (8 mg total) by mouth every eight (8) hours as needed for nausea for up to 7 days.     polyethylene glycol 17 gram packet  Commonly known as: MIRALAX  Take 17 g by mouth daily.        CONTINUE taking these medications    ALBUTEROL (BULK) MISC  Frequency:null   Dosage:3   gm  Instructions:0.002 = gm 3 mL, inhalation, q6h, for wheezing, # 25 EA, 0, Print Requisition  Note: amLODIPine 10 MG tablet  Commonly known as: NORVASC  Take 1 tablet (10 mg total) by mouth daily.     aspirin 81 MG tablet  Commonly known as: ECOTRIN  Take 81 mg by mouth daily.     atorvastatin 40 MG tablet  Commonly known as: LIPITOR  Take 1 tablet (40 mg total) by mouth daily.     citalopram 40 MG tablet  Commonly known as: CeleXA  Take 1 tablet (40 mg total) by mouth daily.     empty container Misc  Use as directed to dispose of injectable medications     empty container Misc  Commonly known as: sharps container  Use as directed.     hydrOXYzine 25 MG tablet  Commonly known as: ATARAX  Take 1 tablet (25 mg total) by mouth every six (6) hours as needed for anxiety and congestion.     losartan 100 MG tablet  Commonly known as: COZAAR  Take 1 tablet (100 mg total) by mouth daily.     metoprolol succinate 100 MG 24 hr tablet  Commonly known as: TOPROL-XL  Take 1 tablet (100 mg total) by mouth two (2) times a day. Repeat for elevated BP.     nicotine 14 mg/24 hr patch  Commonly known as: NICODERM CQ  Place 1 patch on the skin daily.     omeprazole 20 MG capsule  Commonly known as: PriLOSEC  Take 1 capsule (20 mg total) by mouth two (2) times a day.     orphenadrine 100 mg tablet  Commonly known as: NORFLEX  Take 1 tablet (100 mg total) by mouth two (2) times a day as needed for muscle spasms.     promethazine 12.5 MG tablet  Commonly known as: PHENERGAN  Take 1 tablet (12.5 mg total) by mouth every six (6) hours as needed for nausea.     REPATHA SYRINGE 140 mg/mL Syrg  Generic drug: evolocumab  Inject the contents of 1 syringe (140 mg) under the skin every fourteen (14) days.     traMADoL 50 mg tablet  Commonly known as: ULTRAM  Take 1-1.5 tablets (50-75 mg total) by mouth Every six (6) hours. As needed for pain            Allergies:  Iodine and iodide containing products, Statins-hmg-coa reductase inhibitors, Sulfa (sulfonamide antibiotics), and Oxycodone ______________________________________________________________________  Pending Test Results (if blank, then none):      Most Recent Labs:  CBC - Results in Past 2 Days  Result Component Current Result   WBC 7.8 (05/08/2019)   RBC 4.09 (05/08/2019)   HGB 11.4 (L) (05/08/2019)   HCT 35.8 (L) (05/08/2019)   MCV 87.5 (05/08/2019)   MCH 27.8 (05/08/2019)   MCHC 31.8 (05/08/2019)  MPV 8.4 (05/08/2019)   Platelet 235 (05/08/2019)     BMP - Results in Past 2 Days  Result Component Current Result   Sodium 138 (05/08/2019)   Potassium 3.7 (05/08/2019)   Chloride 99 (05/08/2019)   CO2 34.0 (H) (05/08/2019)   BUN 14 (05/08/2019)   Creatinine 0.59 (L) (05/08/2019)   EST.GFR (MDRD) Not in Time Range   Glucose 121 (05/08/2019)     Relevant Studies/Radiology (if blank, then none):  Xr Chest Portable    Result Date: 05/06/2019  EXAM: XR CHEST PORTABLE DATE: 05/06/2019 8:30 AM ACCESSION: [id number] UN DICTATED: 05/06/2019 8:26 AM INTERPRETATION LOCATION: [facility] CLINICAL INDICATION: 63 years old Female with HYPOXEMIA  COMPARISON: 11/15/2018 and priors. TECHNIQUE: Portable Chest Radiograph. FINDINGS: Unchanged lines and tubes. Linear atelectasis in the retrocardiac left lower lung, patchy airspace opacity in the peripheral left lung base. No pleural effusion or pneumothorax Stable cardiomediastinal silhouette.     Patchy left basilar airspace opacity concerning for developing pneumonia.    Mri Abdomen W Contrast Mrcp    Result Date: 05/05/2019  EXAM: MRI abdomen with and without contrast, MRCP DATE: 05/05/2019 3:06 PM ACCESSION: [id number] UN DICTATED: 05/05/2019 4:09 PM INTERPRETATION LOCATION: [facility] CLINICAL INDICATION: 63 year old Female with pancreatitis; Pancreatitis, acute, initial episode ; Pancreatitis suspected, atypical presentation/labs  COMPARISON: MRI/MRCP 11/11/2018 TECHNIQUE: MRI of the abdomen was obtained with and without IV contrast.  Multisequence, multiplanar and dedicated T2 weighted images that highlight the biliary tree were obtained. FINDINGS: LINES/DEVICES: None. LOWER CHEST: Unremarkable. ABDOMEN: HEPATOBILIARY: No focal hepatic lesions. Dilated common bile duct which measures 1.1 cm proximally with gradual tapering (14:43). Mild/moderate intrahepatic biliary ductal dilatation also similar to prior. Gallbladder is surgically absent. PANCREAS: Edematous pancreatic parenchyma with overall decreased T1 signal intensity notably within the pancreatic body and tail. New 2.9 x 2.3 cm heterogeneous T2 hyperintense/T1 hypointense collection within the pancreatic body which demonstrates peripheral enhancement (20:52), with central necrosis and internal debris. This collection compresses the main pancreatic duct. Dilated main pancreatic duct which measures 0.6 cm (14:43), new from prior. SPLEEN: Unremarkable. ADRENAL GLANDS: Unremarkable. KIDNEYS/URETERS: Kidneys enhance symmetrically. No hydronephrosis. Stable right renal cysts. BOWEL/PERITONEUM/RETROPERITONEUM: No bowel obstruction. Colonic diverticulosis. No acute inflammatory process. No ascites. VASCULATURE: Abdominal aorta within normal limits for patient's age. Unremarkable inferior vena cava. Patent portal venous system LYMPH NODES: No adenopathy. BONES/SOFT TISSUES: No suspicious appearing osseous lesions.     - Acute necrotizing pancreatitis with a 2.9 cm acute necrotic collection within the pancreatic body. - Dilated main pancreatic duct, potentially secondary to mass effect by the acute necrotic collection.    ______________________________________________________________________  Discharge Instructions:   Activity Instructions     Activity as tolerated            Diet Instructions     Discharge diet (specify)      Discharge Nutrition Therapy: Fat Restricted (50 GM)        Other Instructions     Call MD for:  difficulty breathing, headache or visual disturbances      Call MD for:  persistent nausea or vomiting      Call MD for:  severe uncontrolled pain      Call MD for: Temperature > 38.5 Celsius ( > 101.3 Fahrenheit)      Discharge instructions      Ms. Leisey, you were admitted to [facility] for abdominal pain that was due to inflammation of your pancreas (called pancreatitis).  You were given pain medications and evaluated by the GI team.  We recommend that you continue to stick to a lower fat diet to help your pancreas heal.  Use acetaminophen for mild to moderate pain, tramadol or dilaudid for severe pain.  Note that you can use mirilax for constipation.    Please, follow up with your primary care provider for ongoing pain control and the [facility] GI team to discuss further treatment options. You should be contacted with a date and time for next appointemnt.               Follow Up instructions and Outpatient Referrals     Call MD for:  difficulty breathing, headache or visual disturbances      Call MD for:  persistent nausea or vomiting      Call MD for:  severe uncontrolled pain      Call MD for: Temperature > 38.5 Celsius ( > 101.3 Fahrenheit)      Discharge instructions            Appointments which have been scheduled for you    May 14, 2019  9:20 AM  (Arrive by 9:05 AM)  OFFICE VISIT with Desmond Dike, NP  [facility] Primary Care at [facility] REGION) [address]  [place] [address]  [phone]      May 15, 2019  6:30 PM  POLYSOMNOGRAM with SLEEP 11  [facility] NEUROLOGY SLEEP CENTER [facility] [facility] [place] REGION) [address]  [place] [address]  [phone]      Jun 01, 2019 10:40 AM  (Arrive by 10:25 AM)  Physical with Desmond Dike, NP  [facility] Primary Care at [facility] REGION) [address]  [place] [address]  [phone]   Arrive 15 minutes prior to appointment.          ______________________________________________________________________  Discharge Day Services:  BP 128/75  - Pulse 54  - Temp 36.9 ??C (Oral)  - Resp 18  - Ht 170.2 cm (5' 7)  - Wt (!) 105 kg (231 lb 9.5 oz)  - SpO2 99%  - BMI 36.27 kg/m??   Pt seen on the day of discharge and determined appropriate for discharge.    Condition at Discharge: fair    Length of Discharge: I spent greater than 30 mins in the discharge of this patient.

## 2019-05-08 NOTE — Unmapped (Signed)
Patient discharging home, daughter providing transportation.  RN reviewed AVS with patient, including follow-up appointments and medication changes.  No concern with patient discharge at this time.      Problem: Adult Inpatient Plan of Care  Goal: Plan of Care Review  Outcome: Discharged to Home  Goal: Patient-Specific Goal (Individualization)  Outcome: Discharged to Home  Goal: Absence of Hospital-Acquired Illness or Injury  Outcome: Discharged to Home  Goal: Optimal Comfort and Wellbeing  Outcome: Discharged to Home  Goal: Readiness for Transition of Care  Outcome: Discharged to Home  Goal: Rounds/Family Conference  Outcome: Discharged to Home     Problem: Hypertension Comorbidity  Goal: Blood Pressure in Desired Range  Outcome: Discharged to Home     Problem: Pain Acute  Goal: Optimal Pain Control  Outcome: Discharged to Home     Problem: Fluid Imbalance (Pancreatitis)  Goal: Fluid Balance  Outcome: Discharged to Home     Problem: Infection (Pancreatitis)  Goal: Infection Symptom Resolution  Outcome: Discharged to Home     Problem: Nutrition Impaired (Pancreatitis)  Goal: Optimal Nutrition Intake  Outcome: Discharged to Home     Problem: Pain (Pancreatitis)  Goal: Acceptable Pain Control  Outcome: Discharged to Home     Problem: Oral Intake Inadequate (Heart Failure)  Goal: Optimal Nutrition Intake  Outcome: Discharged to Home     Problem: Sleep Disordered Breathing (Heart Failure)  Goal: Effective Breathing Pattern During Sleep  Outcome: Discharged to Home

## 2019-05-08 NOTE — Unmapped (Signed)
VSS, afebrile, A&Ox4. Pt was on room air until her afternoon vitals which showed her having and Sp02 of 88%. Pt placed on 2L [place]. Pt pain managed w/ morphine and dilaudid. Up ad lib. No acute events. Will continue to monitor.   Problem: Adult Inpatient Plan of Care  Goal: Plan of Care Review  Outcome: Progressing  Goal: Patient-Specific Goal (Individualization)  Outcome: Progressing  Goal: Absence of Hospital-Acquired Illness or Injury  Outcome: Progressing  Goal: Optimal Comfort and Wellbeing  Outcome: Progressing  Goal: Readiness for Transition of Care  Outcome: Progressing  Goal: Rounds/Family Conference  Outcome: Progressing     Problem: Hypertension Comorbidity  Goal: Blood Pressure in Desired Range  Outcome: Progressing     Problem: Pain Acute  Goal: Optimal Pain Control  Outcome: Progressing     Problem: Fluid Imbalance (Pancreatitis)  Goal: Fluid Balance  Outcome: Progressing     Problem: Infection (Pancreatitis)  Goal: Infection Symptom Resolution  Outcome: Progressing     Problem: Nutrition Impaired (Pancreatitis)  Goal: Optimal Nutrition Intake  Outcome: Progressing     Problem: Pain (Pancreatitis)  Goal: Acceptable Pain Control  Outcome: Progressing     Problem: Oral Intake Inadequate (Heart Failure)  Goal: Optimal Nutrition Intake  Outcome: Progressing     Problem: Sleep Disordered Breathing (Heart Failure)  Goal: Effective Breathing Pattern During Sleep  Outcome: Progressing

## 2019-05-08 NOTE — Unmapped (Signed)
Patient is alert and oriented x4. VSS. Afebrile. Pain managed with prn meds with relief. Tolerated her solid diet meal without complication. Patient can ambulate independently. No falls this shift. No acute changes noted. Will continue to monitor.   ??  Problem: Adult Inpatient Plan of Care  Goal: Plan of Care Review  Outcome: Progressing  Flowsheets (Taken 05/08/2019 0049)  Plan of Care Reviewed With: patient  Goal: Patient-Specific Goal (Individualization)  Outcome: Progressing  Flowsheets (Taken 05/08/2019 0049)  Patient-Specific Goals (Include Timeframe): pt will verbalize controlled pain this shift  Individualized Care Needs: pain mgt, monitor VS/Labs, falls precaution  Anxieties, Fears or Concerns: pain  Goal: Absence of Hospital-Acquired Illness or Injury  Outcome: Progressing  Intervention: Identify and Manage Fall Risk  Flowsheets (Taken 05/08/2019 0049)  Safety Interventions: fall reduction program maintained  Intervention: Prevent Skin Injury  Flowsheets (Taken 05/08/2019 0049)  Pressure Reduction Techniques: frequent weight shift encouraged  Intervention: Prevent VTE (venous thromboembolism)  Flowsheets (Taken 05/08/2019 0049)  VTE Prevention/Management: anticoagulation therapy  Intervention: Prevent Infection  Flowsheets (Taken 05/08/2019 0049)  Infection Prevention: handwashing promoted  Goal: Optimal Comfort and Wellbeing  Outcome: Progressing  Intervention: Monitor Pain and Promote Comfort  Flowsheets (Taken 05/08/2019 0049)  Pain Management Interventions:  ??? care clustered  ??? pain management plan reviewed with patient/caregiver  ??? quiet environment facilitated  Intervention: Provide Person-Centered Care  Flowsheets (Taken 05/08/2019 0049)  Trust Relationship/Rapport:  ??? care explained  ??? choices provided  ??? questions encouraged  ??? reassurance provided  Goal: Readiness for Transition of Care  Outcome: Progressing  Goal: Rounds/Family Conference  Outcome: Progressing     Problem: Hypertension Comorbidity Goal: Blood Pressure in Desired Range  Outcome: Progressing     Problem: Pain Acute  Goal: Optimal Pain Control  Outcome: Progressing     Problem: Fluid Imbalance (Pancreatitis)  Goal: Fluid Balance  Outcome: Progressing     Problem: Infection (Pancreatitis)  Goal: Infection Symptom Resolution  Outcome: Progressing     Problem: Nutrition Impaired (Pancreatitis)  Goal: Optimal Nutrition Intake  Outcome: Progressing     Problem: Pain (Pancreatitis)  Goal: Acceptable Pain Control  Outcome: Progressing     Problem: Oral Intake Inadequate (Heart Failure)  Goal: Optimal Nutrition Intake  Outcome: Progressing     Problem: Sleep Disordered Breathing (Heart Failure)  Goal: Effective Breathing Pattern During Sleep  Outcome: Progressing

## 2019-05-14 LAB — CBC W/ AUTO DIFF
BASOPHILS ABSOLUTE COUNT: 0 10*9/L (ref 0.0–0.1)
BASOPHILS RELATIVE PERCENT: 0.4 %
EOSINOPHILS ABSOLUTE COUNT: 0.4 10*9/L (ref 0.0–0.4)
HEMATOCRIT: 39.3 % (ref 36.0–46.0)
HEMOGLOBIN: 12.9 g/dL — ABNORMAL LOW (ref 13.5–16.0)
LARGE UNSTAINED CELLS: 2 % (ref 0–4)
LYMPHOCYTES ABSOLUTE COUNT: 2.8 10*9/L (ref 1.5–5.0)
LYMPHOCYTES RELATIVE PERCENT: 26.6 %
MEAN CORPUSCULAR HEMOGLOBIN CONC: 32.8 g/dL (ref 31.0–37.0)
MEAN CORPUSCULAR HEMOGLOBIN: 29.1 pg (ref 26.0–34.0)
MEAN CORPUSCULAR VOLUME: 88.7 fL (ref 80.0–100.0)
MEAN PLATELET VOLUME: 7 fL (ref 7.0–10.0)
MONOCYTES ABSOLUTE COUNT: 0.5 10*9/L (ref 0.2–0.8)
MONOCYTES RELATIVE PERCENT: 4.2 %
NEUTROPHILS ABSOLUTE COUNT: 6.7 10*9/L (ref 2.0–7.5)
PLATELET COUNT: 263 10*9/L (ref 150–440)
RED BLOOD CELL COUNT: 4.43 10*12/L (ref 4.00–5.20)
RED CELL DISTRIBUTION WIDTH: 15.3 % — ABNORMAL HIGH (ref 12.0–15.0)
WBC ADJUSTED: 10.6 10*9/L (ref 4.5–11.0)

## 2019-05-14 LAB — EOSINOPHILS RELATIVE PERCENT: Lab: 3.5

## 2019-05-15 ENCOUNTER — Ambulatory Visit
Admit: 2019-05-15 | Discharge: 2019-05-15 | Disposition: A | Discharge status: Home / Self Care | Payer: MEDICARE | Attending: Emergency Medicine

## 2019-05-15 LAB — BILIRUBIN UA: Lab: NEGATIVE

## 2019-05-15 LAB — COMPREHENSIVE METABOLIC PANEL
ALBUMIN: 3.4 g/dL — ABNORMAL LOW (ref 3.5–5.0)
ALKALINE PHOSPHATASE: 98 U/L (ref 38–126)
ALT (SGPT): 14 U/L (ref <35)
ANION GAP: 12 mmol/L (ref 7–15)
AST (SGOT): 20 U/L (ref 14–38)
BILIRUBIN TOTAL: 0.4 mg/dL (ref 0.0–1.2)
BUN / CREAT RATIO: 27
CALCIUM: 8.7 mg/dL (ref 8.5–10.2)
CHLORIDE: 104 mmol/L (ref 98–107)
CO2: 22 mmol/L (ref 22.0–30.0)
CREATININE: 0.62 mg/dL (ref 0.60–1.00)
EGFR CKD-EPI AA FEMALE: >90 mL/min/{1.73_m2} (ref >=60)
EGFR CKD-EPI NON-AA FEMALE: >90 mL/min/{1.73_m2} (ref >=60)
GLUCOSE RANDOM: 139 mg/dL (ref 70–179)
POTASSIUM: 4.4 mmol/L (ref 3.5–5.0)
SODIUM: 138 mmol/L (ref 135–145)

## 2019-05-15 LAB — URINALYSIS WITH CULTURE REFLEX
BILIRUBIN UA: NEGATIVE
GLUCOSE UA: NEGATIVE
LEUKOCYTE ESTERASE UA: NEGATIVE
NITRITE UA: NEGATIVE
PH UA: 5.5 (ref 5.0–9.0)
PROTEIN UA: NEGATIVE
SPECIFIC GRAVITY UA: 1.03 (ref 1.005–1.040)
SQUAMOUS EPITHELIAL: 5 /HPF (ref 0–5)
UROBILINOGEN UA: 1
WBC UA: 2 /HPF (ref 0–5)

## 2019-05-15 LAB — LIPASE: Triacylglycerol lipase:CCnc:Pt:Ser/Plas:Qn:: 1449 — ABNORMAL HIGH

## 2019-05-15 LAB — BLOOD UREA NITROGEN: Urea nitrogen:MCnc:Pt:Ser/Plas:Qn:: 17

## 2019-05-15 MED ORDER — HYDROMORPHONE 2 MG TABLET
ORAL_TABLET | ORAL | 0 refills | 2.00000 days | Status: CP | PRN
Start: 2019-05-15 — End: 2019-05-20

## 2019-05-15 NOTE — Unmapped (Signed)
[facility] Emergency Department Provider Note      ED Clinical Impression     Final diagnoses:   Epigastric abdominal pain (Primary)   Acute pancreatitis, unspecified complication status, unspecified pancreatitis type       Initial Impression, Assessment and Plan, and ED Course     63 y.o. female with a PMH of pancreatitis, asthma, MI s/p stenting, chronic diastolic heart failure, CAD, BPPV, fibromyalgia, HLD, HTN, and panic disorder presents with upper abdominal pain with associated spasming in her lower and left sided abdomen in the setting of recent admission and discharge for necrotizing pancreatitis. Pt with worsening pain despite use of scheduled dilaudid. No n/v, tolerating po without difficulty. No n/v, able to tolerate po without difficulty.    VSS on arrival, pt afebrile. Pt mildly uncomfortable appearing on exam secondary to pain, however in NAD, non-toxic. Moderate abdominal tenderness noted to to the epigastric region. No guarding, rebound or peritoneal signs. There is some mild tenderness to left flank region however no CVA TTP. No lower abdominal tenderness. Active BS throughout. Cardiopulmonary exam is bening.    Differential includes flare of known chronic pancreatitis given recent hx of the same and that her current pain feels identical, vs less likely gastritis or PUD. Lower concern for ACS given sx similar to prior pancreatitis flares however pt does have hx of CAD. Suspect local TTP to L flank is likely MSK in origin, very low concern for nephrolithiasis as cause of this. Plan for EKG, basic labs, lipase, IV dilaudid 1mg , lidocaine patch to L flank area, IV fluids and reassess.     12:45 AM  EKG normal sinus rhythm, normal axis, normal intervals. No ST segment changes. Prolonged QTc at 491 ms.     CBC, UA wnl.    12:59 AM  Pt reassessed, states pain has improved. States L flank pain/spasm is now resolved after lidocaine patch.    1:53 AM  CMP, lipase still pending. Patient reassessed, states pain initially improved but is starting to return, is concerned given uncontrolled pain at home despite [place] of dilaudid. Chemistries have been sent to [facility] given analyzer is down at [place]. MAO paged for possible admission for pain control given pain uncontrolled with home medication.     2:16 AM  Care of pt signed out to attending physician Dr. Maggie Schwalbe at this time who is aware of pt's case and current plan of care. Pt continues to remains table.       History     Chief Complaint  Abdominal Pain      HPI   Patient was seen by me at 11:11 PM.    Patient is a 63 y.o. female with a PMH of pancreatitis, asthma, MI s/p stenting, chronic diastolic heart failure, CAD, BPPV, fibromyalgia, HLD, HTN, and panic disorder who presents to the ED for upper abdominal pain over the last 4 days, acutely worse over the last 2 days. She also reports occasional spasms in her left flank. Her symptoms are not worsened with eating. Her upper abdominal pain is similar to prior pancreatitis flares. She reports daily regular BMs, most recently at 7 PM today. Her urine is malodorous, but she has no other urinary symptoms. She denies n/v, is able to tolerate po without difficulty. Denies any fevers or chills. Denies chest pain or SOB. She reached out to the GI clinic today and was scheduled for follow up on 06/10/19. Surgical hx includes cholecystectomy and ERCP w/ sphincterotomy.  She was first diagnosed with pancreatitis in January 2020 following biliary sphincterotomy and sludge removal. She was admitted 7/22-7/25 for acute pancreatitis with infected necrosis. On arrival she was found to have leukocytosis to 11.2 with lipase elevated to 1914. MRI showed necrotizing pancreatitis with a 2.9 cm necrotic collection in the pancreatic body. She was nauseated and vomiting prior to her admission, but she was able to tolerate PO intake during her course. She did not have an ERCP performed during her admission, this study was deferred given pt was improving clinically. She was discharged with oral Dilaudid 1 mg q4hours, but patient reports that she has been taking 2 mg every 4 hours without relief. She is scared to take more than that. Her last dose of this was around 8 PM tonight, although the patient is not completely sure. Denies nausea, vomiting, fevers, chills, changes to bowel habits, SOB, or chest pain.    Previous chart, nursing notes, and vital signs reviewed.      Pertinent labs & imaging results that were available during my care of the patient were reviewed by me and considered in my medical decision making (see chart for details).    Past Medical History:   Diagnosis Date   ??? Anxiety    ??? Asthma    ??? BPPV (benign paroxysmal positional vertigo)    ??? Depression    ??? Fibromyalgia    ??? Heart disease    ??? Hyperlipidemia    ??? Hypertension    ??? Myocardial infarction (CMS-HCC) 2008    s/p stent placement x 1 at [facility]   ??? Panic disorder    ??? Urge incontinence        Past Surgical History:   Procedure Laterality Date   ??? cardiac stent x 1     ??? CHOLECYSTECTOMY     ??? PR ERCP,SPHINCTEROTOMY N/A 11/12/2018    Procedure: ERCP; W/SPHINCTEROTOMY/PAPILLOTOMY;  Surgeon: Vonda Antigua, MD;  [facility];  Service: Gastroenterology   ??? PR ERCP,W/REMOVAL STONE,BIL/PANCR DUCTS N/A 11/12/2018    Procedure: ERCP; W/ENDOSCOPIC RETROGRADE REMOVAL OF CALCULUS/CALCULI FROM BILIARY &/OR PANCREATIC DUCTS;  Surgeon: Vonda Antigua, MD;  Location: [facility];  Service: Gastroenterology   ??? TONSILLECTOMY         No current facility-administered medications for this encounter.     Current Outpatient Medications:   ???  ALBUTEROL, BULK, MISC, Frequency:null   Dosage:3   gm  Instructions:0.002 = gm 3 mL, inhalation, q6h, for wheezing, # 25 EA, 0, Print Requisition  Note:, Disp: , Rfl:   ???  amLODIPine (NORVASC) 10 MG tablet, Take 1 tablet (10 mg total) by mouth daily., Disp: 90 tablet, Rfl: 3  ???  aspirin (ECOTRIN) 81 MG tablet, Take 81 mg by mouth daily., Disp: , Rfl:   ???  atorvastatin (LIPITOR) 40 MG tablet, Take 1 tablet (40 mg total) by mouth daily., Disp: 90 tablet, Rfl: 3  ???  citalopram (CELEXA) 40 MG tablet, Take 1 tablet (40 mg total) by mouth daily., Disp: 90 tablet, Rfl: 3  ???  empty container (SHARPS CONTAINER) Misc, Use as directed., Disp: 1 each, Rfl: 3  ???  empty container Misc, Use as directed to dispose of injectable medications, Disp: 1 each, Rfl: 3  ???  evolocumab (REPATHA SYRINGE) 140 mg/mL Syrg, Inject the contents of 1 syringe (140 mg) under the skin every fourteen (14) days., Disp: 6 mL, Rfl: 3  ???  hydrOXYzine (ATARAX) 25 MG tablet, Take 1  tablet (25 mg total) by mouth every six (6) hours as needed for anxiety and congestion., Disp: 60 tablet, Rfl: 3  ???  losartan (COZAAR) 100 MG tablet, Take 1 tablet (100 mg total) by mouth daily., Disp: 90 tablet, Rfl: 3  ???  metoprolol succinate (TOPROL-XL) 100 MG 24 hr tablet, Take 1 tablet (100 mg total) by mouth two (2) times a day. Repeat for elevated BP., Disp: 180 tablet, Rfl: 3  ???  nicotine (NICODERM CQ) 14 mg/24 hr patch, Place 1 patch on the skin daily. (Patient not taking: Reported on 03/17/2019), Disp: 28 patch, Rfl: 2  ???  omeprazole (PRILOSEC) 20 MG capsule, Take 1 capsule (20 mg total) by mouth two (2) times a day., Disp: 180 capsule, Rfl: 1  ???  orphenadrine (NORFLEX) 100 mg tablet, Take 1 tablet (100 mg total) by mouth two (2) times a day as needed for muscle spasms., Disp: 20 tablet, Rfl: 0  ???  polyethylene glycol (GLYCOLAX) 17 gram/dose powder, Mix 1 capful (17g) in 4-8 ounces of water and drink by mouth daily., Disp: 238 g, Rfl: 0  ???  promethazine (PHENERGAN) 12.5 MG tablet, Take 1 tablet (12.5 mg total) by mouth every six (6) hours as needed for nausea., Disp: 20 tablet, Rfl: 0  ???  traMADoL (ULTRAM) 50 mg tablet, Take 1-1.5 tablets (50-75 mg total) by mouth Every six (6) hours. As needed for pain, Disp: 20 tablet, Rfl: 0    Allergies  Iodine and iodide containing products, Statins-hmg-coa reductase inhibitors, Sulfa (sulfonamide antibiotics), and Oxycodone    Family History   Problem Relation Age of Onset   ??? Heart disease Father    ??? Coronary artery disease Father         quadruple bypass   ??? Mental illness Father    ??? Cancer Sister    ??? Substance Abuse Disorder Neg Hx    ??? Alcohol abuse Neg Hx    ??? Drug abuse Neg Hx        Social History  Social History     Tobacco Use   ??? Smoking status: Former Smoker     Packs/day: 0.75     Types: Cigarettes     Quit date: 05/02/2019     Years since quitting: 0.0   ??? Smokeless tobacco: Never Used   ??? Tobacco comment: smokes 0.5-0.75 ppd   Substance Use Topics   ??? Alcohol use: Not Currently     Frequency: Monthly or less   ??? Drug use: Not Currently     Types: Cocaine     Comment: not used cocaine in 13 years       Review of Systems    Review of Systems   Constitutional: Negative for chills and fever.   Respiratory: Negative for shortness of breath.    Cardiovascular: Negative for chest pain.   Gastrointestinal: Positive for abdominal pain. Negative for constipation, diarrhea, nausea and vomiting.   Genitourinary: Positive for flank pain (spasms to L flank area). Negative for decreased urine volume, difficulty urinating, dysuria, frequency, hematuria, menstrual problem, pelvic pain, urgency, vaginal bleeding, vaginal discharge and vaginal pain.        Malodorous urine   All other systems reviewed and are negative.      Labs     Labs Reviewed   URINALYSIS WITH CULTURE REFLEX - Abnormal; Notable for the following components:       Result Value    Blood, UA Moderate (*)     Bacteria, UA  Few (*)     Mucus, UA Moderate (*)     All other components within normal limits   COMPREHENSIVE METABOLIC PANEL - Abnormal; Notable for the following components:    Albumin 3.4 (*)     All other components within normal limits   LIPASE - Abnormal; Notable for the following components:    Lipase 1,449 (*)     All other components within normal limits   CBC W/ AUTO DIFF - Abnormal; Notable for the following components:    HGB 12.9 (*)     RDW 15.3 (*)     All other components within normal limits    Narrative:     Please use the Absolute Differential for reference ranges.    URINE CULTURE - Normal    Narrative:     Specimen Source: Clean Catch   CBC W/ DIFFERENTIAL    Narrative:     The following orders were created for panel order CBC w/ Differential.  Procedure                               Abnormality         Status                     ---------                               -----------         ------                     CBC w/ Differential[[id number]]         Abnormal            Final result                 Please view results for these tests on the individual orders.       Radiology     No results found.    Physical Exam     VITAL SIGNS:    Vitals:    05/14/19 2239 05/15/19 0256   BP: 141/87 136/68   Pulse: 79 65   Resp: 18 14   Temp: 36.7 ??C (98.1 ??F)    TempSrc: Oral    SpO2: 95% 96%   Height: 170.2 cm (5' 7)        Physical Exam  Vitals signs and nursing note reviewed.   Constitutional:       General: She is not in acute distress.     Appearance: She is well-developed. She is not ill-appearing, toxic-appearing or diaphoretic.      Comments: Pt mildly uncomfortable appearing on exam secondary to pain, however in NAD, non-toxic.   HENT:      Head: Normocephalic and atraumatic.   Neck:      Musculoskeletal: Normal range of motion.   Cardiovascular:      Rate and Rhythm: Normal rate and regular rhythm.      Heart sounds: Normal heart sounds. No murmur. No friction rub. No gallop.    Pulmonary:      Effort: Pulmonary effort is normal. No respiratory distress.      Breath sounds: Normal breath sounds. No stridor. No wheezing or rales.   Abdominal:      General: Bowel sounds are normal. There is no distension.      Palpations: Abdomen  is soft. There is no mass.      Tenderness: There is abdominal tenderness. There is no right CVA tenderness, left CVA tenderness, guarding or rebound.      Hernia: No hernia is present.      Comments: Moderate tenderness to epigastric area of abdomen. No guarding, rebound or peritoneal signs. Focal tenderness to left flank. No lower abdominal tenderness. Active BS throughout.   Musculoskeletal: Normal range of motion.   Skin:     General: Skin is warm and dry.   Neurological:      Mental Status: She is alert and oriented to person, place, and time.   Psychiatric:         Behavior: Behavior normal.       EKG     EKG normal sinus rhythm, normal axis, normal intervals. No ST segment changes. Prolonged QTc at 491 ms.     Documentation assistance was provided by Leighton Ruff, Scribe, on May 14, 2019 at 11:44 PM Bluford Kaufmann, PA.     Documentation assistance was provided by the scribe in my presence.  The documentation recorded by the scribe has been reviewed by me and accurately reflects the services I personally performed.       Toni Arthurs [place], [place]  05/22/19 [phone]

## 2019-05-15 NOTE — Unmapped (Signed)
Comprehensive Metabolic Panel  Order: [id number]  Status:  Final result ????    Lipase Level  Order: [id number]  Status:  Final result ????    Sent to provider for review and action

## 2019-05-15 NOTE — Unmapped (Signed)
Pt having upper abd pain, states pancreas is acting up. No n/v/d. No urinary complaints.

## 2019-05-25 NOTE — Unmapped (Signed)
Spoke to patient about no show on 07/31, she stated she went to the er and was unable to call and cancel. She was educated on the no show policy and did not need to reschedule at this time.

## 2019-05-31 NOTE — Unmapped (Signed)
[facility] Note:  [place], [place] [address]. Phone [phone]    06/01/2019    Patient Name:   Marie Quinn    MRN: [id number]    Demographics:    Age-  63 y.o.     Date of Birth-  1955-12-27    Chief complaint (CC):    Chief Complaint   Patient presents with   ??? Hospital F/U on pancreatitis       Assessment/Plan:    Pleasant 63 y.o. female with PMH of anxiety, bilateral LE pain, asthma here for [facility] F/U of necrotizing pancreatitis.  I advised her to proceed with F/U with GI on 06/10/19. Encouraged to continue to abstain from alcohol. Reinforced smoking cessation. Rx Wellbutrin 150 mg BID.   RF zofran, albuterol, hydroxyzine.     I referred her to neurology for further evaluation and treatment of bilateral LE pain. Advised Tylenol prn for pain. Smoking cessation will help reduce pain.     Diagnosis ICD-10-CM Associated Orders   1. Other acute pancreatitis with infected necrosis  K85.82 Advised F/U with GI as scheduled.   2. Anxiety  F41.9 hydrOXYzine (ATARAX) 25 MG tablet   3. Pain in both lower extremities  M79.604 Ambulatory referral to Neurology    M79.605    4. Mild intermittent asthma without complication  J45.20 albuterol HFA 90 mcg/actuation inhaler     albuterol 2.5 mg /3 mL (0.083 %) nebulizer solution   5. Tobacco dependence  F17.200 albuterol HFA 90 mcg/actuation inhaler     albuterol 2.5 mg /3 mL (0.083 %) nebulizer solution     buPROPion (WELLBUTRIN SR) 150 MG 12 hr tablet       30 minutes of face to face time, > 1/2 the office visit  on counseling and coordination of care. Discussed dietary, lifestyle, and health maintenance modifications to optimize health. Due to COVID-19, masks worn and standard precautions followed during visit. Medication adherence and barriers to the treatment plan have been addressed.  Patient voiced understanding, needs F/U visit as scheduled.    Health Maintenance:   Health Maintenance Due   Topic Date Due   ??? Pap Smear (21-65)  02/25/1979   ??? Mammogram Start Age 78  02/25/2008   ??? FOBT/FIT  02/25/2008   ??? Colonoscopy  02/25/2008   ??? Zoster Vaccines (1 of 2) 02/25/2008     Reviewed:  Immunizations: TDaP (UTD), Influenza (UTD), Pneumovax (UTD) Shingles - Due  Mammograms - pending  Bone Density  Pap Smear - Due - deferred  Lab testing, A1c - Due  ECG  Colonoscopy - Due    Subjective:    History of present illness (HPI):       Marie Quinn is a 63 y.o. female for [facility] F/U of necrotizing pancreatitis. Patient's symptoms include nausea and abdominal pain. Patient reports specialist discussed removal of necrotic collection and stent placement. She is scheduled for follow up with GI on 04/10/19.  Denies any alcohol use. Medications used in the past to treat these symptoms include zofran, dilaudid, multiple pain meds. Patient has required emergency room treatment for these symptoms, and has required hospitalization.     She reports pain in LE has persisted.     Patient is still smoking - 1/3 ppd. She states anytime when I get stressed or upset, I reach for a cigarette. She expresses she knows she needs to quit. Past treatment includes: Chantix (pt had MI while taking, does not want to resume), Wellbutrin (  pt reports effective).     Denies HA, Fever, chest pain, bladder issues, or swelling. (+) abdominal pain, nausea    Relevant ROS: Reviewed 12 systems, positive findings listed, all others negative.    Pertinent Past Med Hx:    Past Medical History:   Diagnosis Date   ??? Anxiety    ??? Arthritis     Adult life   ??? Asthma    ??? BPPV (benign paroxysmal positional vertigo)    ??? Chronic diastolic CHF (congestive heart failure) (CMS-HCC)    ??? COPD (chronic obstructive pulmonary disease) (CMS-HCC)    ??? Depression    ??? Fibromyalgia    ??? GERD (gastroesophageal reflux disease)     Adult life   ??? Heart disease    ??? Heart murmur    ??? Hyperlipidemia    ??? Hypertension    ??? Myocardial infarction (CMS-HCC) 2008    s/p stent placement x 1 at [facility]   ??? Obesity    ??? Panic disorder    ??? Substance abuse (CMS-HCC)    ??? Urge incontinence        Medications:       Current Outpatient Medications:   ???  ALBUTEROL, BULK, MISC, Frequency:null   Dosage:3   gm  Instructions:0.002 = gm 3 mL, inhalation, q6h, for wheezing, # 25 EA, 0, Print Requisition  Note:, Disp: , Rfl:   ???  amLODIPine (NORVASC) 10 MG tablet, Take 1 tablet (10 mg total) by mouth daily., Disp: 90 tablet, Rfl: 3  ???  aspirin (ECOTRIN) 81 MG tablet, Take 81 mg by mouth daily., Disp: , Rfl:   ???  atorvastatin (LIPITOR) 40 MG tablet, Take 1 tablet (40 mg total) by mouth daily., Disp: 90 tablet, Rfl: 3  ???  citalopram (CELEXA) 40 MG tablet, Take 1 tablet (40 mg total) by mouth daily., Disp: 90 tablet, Rfl: 3  ???  empty container (SHARPS CONTAINER) Misc, Use as directed., Disp: 1 each, Rfl: 3  ???  empty container Misc, Use as directed to dispose of injectable medications, Disp: 1 each, Rfl: 3  ???  evolocumab (REPATHA SYRINGE) 140 mg/mL Syrg, Inject the contents of 1 syringe (140 mg) under the skin every fourteen (14) days., Disp: 6 mL, Rfl: 3  ???  hydrOXYzine (ATARAX) 25 MG tablet, Take 1 tablet (25 mg total) by mouth every six (6) hours as needed for anxiety and congestion., Disp: 90 tablet, Rfl: 3  ???  losartan (COZAAR) 100 MG tablet, Take 1 tablet (100 mg total) by mouth daily., Disp: 90 tablet, Rfl: 3  ???  metoprolol succinate (TOPROL-XL) 100 MG 24 hr tablet, Take 1 tablet (100 mg total) by mouth two (2) times a day. Repeat for elevated BP., Disp: 180 tablet, Rfl: 3  ???  omeprazole (PRILOSEC) 20 MG capsule, Take 1 capsule (20 mg total) by mouth two (2) times a day., Disp: 180 capsule, Rfl: 1  ???  ondansetron (ZOFRAN-ODT) 8 MG disintegrating tablet, Dissolve 1 tablet (8 mg total) by mouth  or (on tongue) every eight (8) hours as needed for nausea for up to 7 days., Disp: 21 tablet, Rfl: 0  ???  orphenadrine (NORFLEX) 100 mg tablet, Take 1 tablet (100 mg total) by mouth two (2) times a day as needed for muscle spasms., Disp: 20 tablet, Rfl: 0  ???  polyethylene glycol (GLYCOLAX) 17 gram/dose powder, Mix 1 capful (17g) in 4-8 ounces of water and drink by mouth daily., Disp: 238 g, Rfl: 0  ???  promethazine (PHENERGAN) 12.5 MG tablet, Take 1 tablet (12.5 mg total) by mouth every six (6) hours as needed for nausea., Disp: 20 tablet, Rfl: 0  ???  traMADoL (ULTRAM) 50 mg tablet, Take 1-1.5 tablets (50-75 mg total) by mouth Every six (6) hours. As needed for pain, Disp: 20 tablet, Rfl: 0  ???  albuterol 2.5 mg /3 mL (0.083 %) nebulizer solution, Inhale 3 mL (2.5 mg total) by nebulization every four (4) hours as needed for wheezing., Disp: 25 vial, Rfl: 2  ???  albuterol HFA 90 mcg/actuation inhaler, Inhale 2 puffs every four (4) hours as needed for wheezing., Disp: 1 Inhaler, Rfl: 1  ???  buPROPion (WELLBUTRIN SR) 150 MG 12 hr tablet, Take 1 tablet (150 mg total) by mouth Two (2) times a day., Disp: 60 tablet, Rfl: 3     Allergies:   Allergies   Allergen Reactions   ??? Iodine And Iodide Containing Products Anaphylaxis   ??? Statins-Hmg-Coa Reductase Inhibitors      Side effects   ??? Sulfa (Sulfonamide Antibiotics)    ??? Oxycodone Nausea Only     Makes her feel weird       Pertinent Social Hx and Habits: EMR reviewed    Pertinent Family Hx: EMR reviewed    Objective:      Vitals:    06/01/19 1049   BP: 140/80   Pulse: 86   Temp: 36.8 ??C (98.3 ??F)   TempSrc: Temporal   SpO2: 96%   Weight: (!) 103 kg (227 lb)        Physical Exam:    General Appearance: WDWN in NAD, Obese     Skin: W, D, I  HEENT: PERRLA, EOMI, TM's clear  Respiratory: Clear throughout  Cardio: RRR  Abdomen: Soft, Mild LUQ pain  Neurologic: A & O X 4, Grossly intact, stable gait  GU: No CVA tenderness  PSYCH: Behavior calm and cooperative    Diagnostics:     Lab Results   Component Value Date    WBC 10.6 05/14/2019    HGB 12.9 (L) 05/14/2019    HCT 39.3 05/14/2019    PLT 263 05/14/2019       Lab Results   Component Value Date    NA 138 05/14/2019    K 4.4 05/14/2019    CL 104 05/14/2019    CO2 22.0 05/14/2019    BUN 17 05/14/2019    CREATININE 0.62 05/14/2019    GLU 139 05/14/2019    CALCIUM 8.7 05/14/2019    MG 1.8 02/05/2013    PHOS 4.0 02/05/2013       Lab Results   Component Value Date    BILITOT 0.4 05/14/2019    PROT 6.9 05/14/2019    ALBUMIN 3.4 (L) 05/14/2019    ALT 14 05/14/2019    AST 20 05/14/2019    ALKPHOS 98 05/14/2019    GGT 34 02/05/2013       Lab Results   Component Value Date    PT 13.2 (H) 05/06/2019    INR 1.14 05/06/2019    APTT 26.8 02/01/2011       No results found for: A1C    I attest that I, Bayard Hugger, personally documented this note while acting as scribe for Olena Leatherwood, NP.      Bayard Hugger, Scribe.  06/01/2019     The documentation recorded by the scribe accurately reflects the service I personally performed and the decisions made by me.  Olena Leatherwood, NP

## 2019-06-01 ENCOUNTER — Ambulatory Visit: Admit: 2019-06-01 | Discharge: 2019-06-02 | Payer: MEDICARE | Attending: Family | Primary: Family

## 2019-06-01 DIAGNOSIS — J4520 Mild intermittent asthma, uncomplicated: Secondary | ICD-10-CM

## 2019-06-01 DIAGNOSIS — F419 Anxiety disorder, unspecified: Secondary | ICD-10-CM

## 2019-06-01 DIAGNOSIS — F17200 Nicotine dependence, unspecified, uncomplicated: Secondary | ICD-10-CM

## 2019-06-01 DIAGNOSIS — M79605 Pain in left leg: Secondary | ICD-10-CM

## 2019-06-01 DIAGNOSIS — K8582 Other acute pancreatitis with infected necrosis: Principal | ICD-10-CM

## 2019-06-01 DIAGNOSIS — M79604 Pain in right leg: Secondary | ICD-10-CM

## 2019-06-01 MED ORDER — HYDROXYZINE HCL 25 MG TABLET
ORAL_TABLET | Freq: Four times a day (QID) | ORAL | 3 refills | 23.00000 days | Status: CP | PRN
Start: 2019-06-01 — End: ?
  Filled 2019-06-03: qty 90, 23d supply, fill #0

## 2019-06-01 MED ORDER — ONDANSETRON 8 MG DISINTEGRATING TABLET
ORAL_TABLET | Freq: Three times a day (TID) | ORAL | 0 refills | 7.00000 days | Status: CP | PRN
Start: 2019-06-01 — End: 2019-06-10
  Filled 2019-06-03: qty 21, 7d supply, fill #0

## 2019-06-01 MED ORDER — ALBUTEROL SULFATE HFA 90 MCG/ACTUATION AEROSOL INHALER
RESPIRATORY_TRACT | 1 refills | 0 days | Status: CP | PRN
Start: 2019-06-01 — End: 2020-05-31

## 2019-06-01 MED ORDER — BUPROPION HCL SR 150 MG TABLET,12 HR SUSTAINED-RELEASE: 150 mg | tablet | Freq: Two times a day (BID) | 3 refills | 30 days | Status: AC

## 2019-06-01 MED ORDER — BUPROPION HCL SR 150 MG TABLET,12 HR SUSTAINED-RELEASE
ORAL_TABLET | Freq: Two times a day (BID) | ORAL | 3 refills | 30.00000 days | Status: CP
Start: 2019-06-01 — End: 2019-06-01
  Filled 2019-07-14: qty 60, 30d supply, fill #0

## 2019-06-01 MED ORDER — ALBUTEROL SULFATE 2.5 MG/3 ML (0.083 %) SOLUTION FOR NEBULIZATION
RESPIRATORY_TRACT | 2 refills | 5 days | Status: CP | PRN
Start: 2019-06-01 — End: 2020-05-31
  Filled 2019-06-03: qty 90, 5d supply, fill #0

## 2019-06-03 MED FILL — ALBUTEROL SULFATE HFA 90 MCG/ACTUATION AEROSOL INHALER: RESPIRATORY_TRACT | 17 days supply | Qty: 18 | Fill #0

## 2019-06-03 MED FILL — HYDROXYZINE HCL 25 MG TABLET: 23 days supply | Qty: 90 | Fill #0 | Status: AC

## 2019-06-03 MED FILL — ALBUTEROL SULFATE HFA 90 MCG/ACTUATION AEROSOL INHALER: 17 days supply | Qty: 18 | Fill #0 | Status: AC

## 2019-06-03 MED FILL — ALBUTEROL SULFATE 2.5 MG/3 ML (0.083 %) SOLUTION FOR NEBULIZATION: 5 days supply | Qty: 90 | Fill #0 | Status: AC

## 2019-06-03 MED FILL — ONDANSETRON 8 MG DISINTEGRATING TABLET: 7 days supply | Qty: 21 | Fill #0 | Status: AC

## 2019-06-10 ENCOUNTER — Ambulatory Visit: Admit: 2019-06-10 | Discharge: 2019-06-11 | Payer: MEDICARE

## 2019-06-10 DIAGNOSIS — Z800 Family history of malignant neoplasm of digestive organs: Secondary | ICD-10-CM

## 2019-06-10 DIAGNOSIS — K8512 Biliary acute pancreatitis with infected necrosis: Principal | ICD-10-CM

## 2019-06-10 MED ORDER — POLYETHYLENE GLYCOL 3350 17 GRAM/DOSE ORAL POWDER
Freq: Every day | ORAL | 3 refills | 14 days | Status: CP
Start: 2019-06-10 — End: 2019-07-10
  Filled 2019-06-15: qty 238, 14d supply, fill #0

## 2019-06-10 NOTE — Unmapped (Signed)
[facility] ADVANCED ENDOSCOPY / BILIARY CONSULTATION NOTE     Reason for Consult: recurrent pancreatitis    Referring Physician:   Desmond Dike, NP  [address]  [place],  [place] [address]    Chief Complaint:hospital follow-up recurrent pancreatitis     History of Presenting Illness: 63 y.o. 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 Her imaging demonstrated an acute necrotic collection (2.9cm) within the pancreatic body and a dilated main pancreatic duct. Her symptoms improved and she was discharged to home. Now being seen in GI clinic for further evaluation and follow-up.    Per our inpatient consult note dated 05/06/2019:  ???Ms. Marchese initially presented to our hospital in late January with abnormal liver tests elevated in a mixed pattern (AlkP 316, AST 546, ALT 380, Tbili 1.9) and was found to have mildly increased intrahepatic and extrahepatic biliary ductal dilation on MRCP. An ERCP was done and a biliary sphincterotomy was performed, sludge was found. She unfortunately developed post-ERCP pancreatitis. There was also concern that her liver tests may have been secondary to DILI related to Macrobid. They did improve after ERCP and with discontinuing Macrobid and had normalized by early February. Since that time, she states that she has had several episodes of abdominal pain that she has managed at home with tramadol, phenergan, and PO fluids. She characterizes the pain as severe. It usually lasts about 3-4 days before resolving. She presented to her PCP in mid-May during one of these episodes and lipase was 316 at that time. She denies abdominal pain in between these episodes, but does have chronic back pain.      The pain recurred last week. It starts in her back and radiates around to her mid-epigastrium. It initially improved some over the weekend, but then worsened again prompting her to present to the ED for evaluation. The abdominal pain is now more diffuse. It was accompanied by vomiting, decreased appetite, and chills. She denies recent fevers or weight loss.      She was hemodynamically stable on arrival, but has become more hypotensive since admission to 90s/50s off her home anti-hypertensives. She remains afebrile. Labs are notable for WBC 11, normal liver tests, lipase 1914, triglycerides 127, Ca 9.4. MRCP demonstrates acute necrotizing pancreatitis with a 2.9cm acute necrotic collection within the pancreatic body and a dilated main pancreatic duct. She has been started on IV fluids and empiric IV zosyn. She states that her pain continues to be severe, rating it 8/10, because she has not received analgesics since last night.      Her medical history is notable for hypertension, improved since initiation of chlorthalidone in early June. She was started on evolocumab 3 weeks ago for HLD. Her family history is notable for pancreatitis in her mother 1-2 times yearly. She smokes 1ppd???    We had made patient NPO for possible ERCP if no improvement, but her symptoms improved and overall felt it was safe to discharge her to home.  Unclear etiology of pancreatitis (does continue to smoke, had been on chlorthalidone which we asked to hold.)     Returned to the ER on 05/14/2019 with ongoing abdominal pain despite scheduled dilaudid. Labs notable for normal CBC, normal BMP, normal LFT but lipase 1440.     Today in  clinic:  --on Monday she has to take repatha for cholesterol; this Monday she felt really nauseous but thinks it was related to that. No other nausea. No vomiting. Lost few lbs since July. Appetite is improving.   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 On wellbutrin for this  --having constipation as well; having lower abdominal pains post eating--> used to poop every 3-4 days and now for the last 5 years has been pooping every other day; she takes miralax every day   --today is the first day she feels better since January    \Note: Sister with colon cancer- 30 y/o (deceased). Ms. Silliman has never had her colonoscopy and is interested in pursuing.     Past Medical History:   Diagnosis Date   ??? Anxiety    ??? Arthritis     Adult life   ??? Asthma    ??? BPPV (benign paroxysmal positional vertigo)    ??? Chronic diastolic CHF (congestive heart failure) (CMS-HCC)    ??? COPD (chronic obstructive pulmonary disease) (CMS-HCC)    ??? Depression    ??? Fibromyalgia    ??? GERD (gastroesophageal reflux disease)     Adult life   ??? Heart disease    ??? Heart murmur    ??? Hyperlipidemia    ??? Hypertension    ??? Myocardial infarction (CMS-HCC) 2008    s/p stent placement x 1 at [facility]   ??? Obesity    ??? Panic disorder    ??? Substance abuse (CMS-HCC)    ??? Urge incontinence        Medications:    Current Outpatient Medications:   ???  albuterol 2.5 mg /3 mL (0.083 %) nebulizer solution, Inhale the contents of 1 vial (2.5 mg total) by nebulization every four (4) hours as needed for wheezing., Disp: 25 vial, Rfl: 2  ???  albuterol HFA 90 mcg/actuation inhaler, Inhale 2 puffs every four (4) hours as needed for wheezing., Disp: 18 g, Rfl: 1  ???  amLODIPine (NORVASC) 10 MG tablet, Take 1 tablet (10 mg total) by mouth daily., Disp: 90 tablet, Rfl: 3  ???  aspirin (ECOTRIN) 81 MG tablet, Take 81 mg by mouth daily., Disp: , Rfl:   ???  atorvastatin (LIPITOR) 40 MG tablet, Take 1 tablet (40 mg total) by mouth daily., Disp: 90 tablet, Rfl: 3  ???  buPROPion (WELLBUTRIN SR) 150 MG 12 hr tablet, Take 1 tablet (150 mg total) by mouth Two (2) times a day., Disp: 60 tablet, Rfl: 3  ???  citalopram (CELEXA) 40 MG tablet, Take 1 tablet (40 mg total) by mouth daily., Disp: 90 tablet, Rfl: 3  ???  empty container (SHARPS CONTAINER) Misc, Use as directed., Disp: 1 each, Rfl: 3  ???  empty container Misc, Use as directed to dispose of injectable medications, Disp: 1 each, Rfl: 3  ???  evolocumab (REPATHA SYRINGE) 140 mg/mL Syrg, Inject the contents of 1 syringe (140 mg) under the skin every fourteen (14) days., Disp: 6 mL, Rfl: 3  ???  hydrOXYzine (ATARAX) 25 MG tablet, Take 1 tablet (25 mg total) by mouth every six (6) hours as needed for anxiety and congestion., Disp: 90 tablet, Rfl: 3  ???  losartan (COZAAR) 100 MG tablet, Take 1 tablet (100 mg total) by mouth daily., Disp: 90 tablet, Rfl: 3  ???  metoprolol succinate (TOPROL-XL) 100 MG 24 hr tablet, Take 1 tablet (100 mg total) by mouth two (2) times a day. Repeat for elevated BP., Disp: 180 tablet, Rfl: 3  ???  omeprazole (PRILOSEC) 20 MG capsule, Take 1 capsule (20 mg total) by mouth two (2) times a day., Disp: 180 capsule, Rfl: 1  ???  ondansetron (ZOFRAN-ODT) 8 MG disintegrating tablet, Dissolve 1 tablet (8 mg total) by mouth  or (on tongue) every eight (8) hours as needed for nausea for up to 7 days., Disp: 21 tablet, Rfl: 0  ???  orphenadrine (NORFLEX) 100 mg tablet, Take 1 tablet (100 mg total) by mouth two (2) times a day as needed for muscle spasms., Disp: 20 tablet, Rfl: 0  ???  polyethylene glycol (GLYCOLAX) 17 gram/dose powder, Mix 1 capful (17g) in 4-8 ounces of water and drink by mouth daily., Disp: 238 g, Rfl: 0  ???  ALBUTEROL, BULK, MISC, Frequency:null   Dosage:3   gm  Instructions:0.002 = gm 3 mL, inhalation, q6h, for wheezing, # 25 EA, 0, Print Requisition  Note:, Disp: , Rfl:   ???  promethazine (PHENERGAN) 12.5 MG tablet, Take 1 tablet (12.5 mg total) by mouth every six (6) hours as needed for nausea. (Patient not taking: Reported on 06/10/2019), Disp: 20 tablet, Rfl: 0  ???  traMADoL (ULTRAM) 50 mg tablet, Take 1-1.5 tablets (50-75 mg total) by mouth Every six (6) hours. As needed for pain (Patient not taking: Reported on 06/10/2019), Disp: 20 tablet, Rfl: 0    Family History:  Family History   Problem Relation Age of Onset   ??? Heart disease Father         Deacesed   ??? Coronary artery disease Father         quadruple bypass   ??? Mental illness Father    ??? Asthma Father    ??? COPD Father    ??? Depression Father    ??? Hypertension Father    ??? Cancer Sister    ??? Arthritis Daughter    ??? Depression Daughter    ??? Asthma Sister    ??? Cancer Sister         Colan cancer deceased   ??? COPD Sister    ??? Depression Sister    ??? Early death Sister         47 yrs old   ??? Cancer Paternal Aunt         Thyroid cancer   ??? Cancer Maternal Grandmother         Brain cancer   ??? Depression Mother    ??? Diabetes Mother    ??? Hypertension Mother    ??? Diabetes Brother    ??? Vision loss Maternal Aunt         Blind at birth   ??? Substance Abuse Disorder Neg Hx    ??? Alcohol abuse Neg Hx    ??? Drug abuse Neg Hx      Sister with colon cancer at age 73  No other pancreatic cancer or GI malignancies to her knowledge     Social History:  Social History     Tobacco Use   ??? Smoking status: Current Every Day Smoker     Years: 25.00     Types: Cigarettes     Last attempt to quit: 05/02/2019     Years since  quitting: 0.1   ??? Smokeless tobacco: Never Used   ??? Tobacco comment: 3 cigarettes per day   Substance Use Topics   ??? Alcohol use: Not Currently     Frequency: Monthly or less       Review of Systems:  Gen: no fever, chills, or weight loss  HEENT: no epistaxis, no oral ulcers  Eyes: no jaundice  CV: no chest pain, no orthopnea  PULM: no shortness of breath, no cough  ABD: as per the HPI  MSK: no arthralgias, no myalgias  Skin: no rash, no pruritis  Neuro: no headache, no confusion  Psych: no depression  Allergies:   Allergies   Allergen Reactions   ??? Iodine And Iodide Containing Products Anaphylaxis   ??? Statins-Hmg-Coa Reductase Inhibitors      Side effects   ??? Sulfa (Sulfonamide Antibiotics)    ??? Oxycodone Nausea Only     Makes her feel weird       Physical Exam:  Vitals:   Vitals:    06/10/19 1216   BP: 132/78   Pulse: 74   Temp: 36.3 ??C (97.4 ??F)     Gen: NAD  HEENT: oropharynx clear,   Eyes: no scleral icterus  CV: normal rate   PULM: breathing comfortably   ABD: S/NT/ND no hepatosplenomegaly  Ext: warm, no edema  Neuro: AAOx4   Skin: no rash, no jaundice  Wt Readings from Last 6 Encounters:   06/10/19 (!) 104.3 kg (230 lb)   06/01/19 (!) 103 kg (227 lb)   05/06/19 (!) 105 kg (231 lb 9.5 oz)   11/24/18 (!) 102.6 kg (226 lb 3.2 oz)   11/12/18 (!) 103.9 kg (229 lb 0.9 oz)   10/23/18 (!) 106.2 kg (234 lb 1.6 oz)      Labs:  Lab Results   Component Value Date    ALKPHOS 98 05/14/2019    BILITOT 0.4 05/14/2019    PROT 6.9 05/14/2019    ALBUMIN 3.4 (L) 05/14/2019    ALT 14 05/14/2019    AST 20 05/14/2019     Lab Results   Component Value Date    WBC 10.6 05/14/2019    HGB 12.9 (L) 05/14/2019    HCT 39.3 05/14/2019    PLT 263 05/14/2019     Lab Results   Component Value Date    PT 13.2 (H) 05/06/2019    PT 13.0 11/12/2018    PT 12.5 02/01/2011    INR 1.14 05/06/2019    INR 1.13 11/12/2018    INR 1.1 02/01/2011       Imaging:  Reviewed in detail in EPIC.  Pertinent imaging details summarized below.    MRCP 05/05/2019:  LOWER CHEST: Unremarkable.  ??  ABDOMEN:  ??  HEPATOBILIARY: No focal hepatic lesions. Dilated common bile duct which measures 1.1 cm proximally with gradual tapering (14:43). Mild/moderate intrahepatic biliary ductal dilatation also similar to prior. Gallbladder is surgically absent.  PANCREAS: Edematous pancreatic parenchyma with overall decreased T1 signal intensity notably within the pancreatic body and tail. New 2.9 x 2.3 cm heterogeneous T2 hyperintense/T1 hypointense collection within the pancreatic body which demonstrates peripheral enhancement (20:52), with central necrosis and internal debris. This collection compresses the main pancreatic duct. Dilated main pancreatic duct which measures 0.6 cm (14:43), new from prior.   SPLEEN: Unremarkable.   ADRENAL GLANDS: Unremarkable.  KIDNEYS/URETERS: Kidneys enhance symmetrically. No hydronephrosis. Stable right renal cysts.  BOWEL/PERITONEUM/RETROPERITONEUM: No bowel obstruction. Colonic diverticulosis. No acute inflammatory process. No ascites.  VASCULATURE: Abdominal aorta within normal limits for patient's age. Unremarkable inferior vena cava. Patent portal venous system  LYMPH NODES: No adenopathy.  ??  BONES/SOFT TISSUES: No suspicious appearing osseous lesions.  ??  IMPRESSION:  ??  - Acute necrotizing pancreatitis with a 2.9 cm acute necrotic collection within the pancreatic body.  ??  - Dilated main pancreatic duct, potentially secondary to mass effect by the acute necrotic collection.    Assessment and Plan:  63 y.o. 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 She subsequently has mild improvement in her symptoms in clinic today.     Her imaging demonstrated an acute necrotic collection (2.9cm) within the pancreatic body and a dilated main pancreatic duct likely from mass effect by the acute necrotic collection. Her symptoms improved and she was discharged to home. However, she re-presented within one week with ongoing abdominal pain, lipase > 1000, without repeat imaging at that time. We discussed in detail at her appointment and again today that our fear is that she likely has PD disruption from her prior acute pancreatitis and that it may be of benefit to her to pursue repeat ERCP with pancreatic stent placement . Explained the risks of the procedure, including infection, bleeding, missed lesion, perforation, and unfortunately, repeat episode of post-ERCP pancreatitis. She expressed her understanding but given the severity of the pain over the last few weeks she does which to pursue ERCP.    Notably, her sister is deceased from colorectal cancer diagnosed at age 39. She has never had a colonoscopy. She has no melena, hematochezia. No weight loss. She has baseline constipation which she manages with miralax but we discussed that given her strong FH and desire to get an adequate look during her colonoscopy, we should pursue a two-day clear liquid diet and bowel preparation. Given the need for COVID testing, we will try to arrange for her colonoscopy one day, with her ERCP the day following.    Plan:  -ERCP for PD stent placement  -colonoscopy for colorectal cancer screening with two-day clear liquid diet and two-day bowel preparation  -will try to work with schedulers to arrange for colonoscopy on one day with ERCP the next (do not want to combine procedures given the possible duration of ERCP, and do not want her to have the ERCP prior to the colonoscopy in case she has recurrence of post-ERCP pancreatitis she will not be able to do the preparation for the colonoscopy)  -RTC pending the above     30 minutes spent with this patient of which greater than 50% of the time was spent in counseling and coordinating care.     Claria Dice. Penny Pia, MD  Division of Gastroenterology  Advanced Therapeutic Endoscopy/Biliary Team

## 2019-06-15 HISTORY — PX: OTHER SURGICAL HISTORY: SHX169

## 2019-06-15 MED FILL — POLYETHYLENE GLYCOL 3350 17 GRAM/DOSE ORAL POWDER: 14 days supply | Qty: 238 | Fill #0 | Status: AC

## 2019-06-18 ENCOUNTER — Ambulatory Visit: Admit: 2019-06-18 | Discharge: 2019-06-19 | Payer: MEDICARE

## 2019-06-18 DIAGNOSIS — Z1239 Encounter for other screening for malignant neoplasm of breast: Secondary | ICD-10-CM

## 2019-06-18 DIAGNOSIS — Z1231 Encounter for screening mammogram for malignant neoplasm of breast: Secondary | ICD-10-CM

## 2019-06-24 DIAGNOSIS — Z800 Family history of malignant neoplasm of digestive organs: Secondary | ICD-10-CM

## 2019-06-24 DIAGNOSIS — Z1211 Encounter for screening for malignant neoplasm of colon: Secondary | ICD-10-CM

## 2019-06-24 MED ORDER — PEG-ELECTROLYTE SOLUTION 420 GRAM ORAL SOLUTION
0 refills | 0 days | Status: CP
Start: 2019-06-24 — End: 2019-06-25

## 2019-06-24 NOTE — Unmapped (Signed)
-----   Message from Loralee Pacas sent at 06/24/2019  4:23 PM EDT -----  Regarding: #Bowel Prep RX MEM  Date:09.24.2020  Gustavus Messing -Colonoscopy  [facility] [place]  [phone]

## 2019-06-25 DIAGNOSIS — K861 Other chronic pancreatitis: Secondary | ICD-10-CM

## 2019-06-25 DIAGNOSIS — E785 Hyperlipidemia, unspecified: Secondary | ICD-10-CM

## 2019-06-25 DIAGNOSIS — Z955 Presence of coronary angioplasty implant and graft: Secondary | ICD-10-CM

## 2019-06-25 DIAGNOSIS — R748 Abnormal levels of other serum enzymes: Secondary | ICD-10-CM

## 2019-06-25 DIAGNOSIS — K219 Gastro-esophageal reflux disease without esophagitis: Secondary | ICD-10-CM

## 2019-06-25 DIAGNOSIS — K863 Pseudocyst of pancreas: Secondary | ICD-10-CM

## 2019-06-25 DIAGNOSIS — I252 Old myocardial infarction: Secondary | ICD-10-CM

## 2019-06-25 DIAGNOSIS — Z9049 Acquired absence of other specified parts of digestive tract: Secondary | ICD-10-CM

## 2019-06-25 DIAGNOSIS — E669 Obesity, unspecified: Secondary | ICD-10-CM

## 2019-06-25 DIAGNOSIS — I110 Hypertensive heart disease with heart failure: Secondary | ICD-10-CM

## 2019-06-25 DIAGNOSIS — I10 Essential (primary) hypertension: Secondary | ICD-10-CM

## 2019-06-25 DIAGNOSIS — G4733 Obstructive sleep apnea (adult) (pediatric): Secondary | ICD-10-CM

## 2019-06-25 DIAGNOSIS — F419 Anxiety disorder, unspecified: Secondary | ICD-10-CM

## 2019-06-25 DIAGNOSIS — Z7951 Long term (current) use of inhaled steroids: Secondary | ICD-10-CM

## 2019-06-25 DIAGNOSIS — Z6836 Body mass index (BMI) 36.0-36.9, adult: Secondary | ICD-10-CM

## 2019-06-25 DIAGNOSIS — Z20828 Contact with and (suspected) exposure to other viral communicable diseases: Secondary | ICD-10-CM

## 2019-06-25 DIAGNOSIS — J4520 Mild intermittent asthma, uncomplicated: Secondary | ICD-10-CM

## 2019-06-25 DIAGNOSIS — F17210 Nicotine dependence, cigarettes, uncomplicated: Secondary | ICD-10-CM

## 2019-06-25 DIAGNOSIS — I2510 Atherosclerotic heart disease of native coronary artery without angina pectoris: Secondary | ICD-10-CM

## 2019-06-25 DIAGNOSIS — F329 Major depressive disorder, single episode, unspecified: Secondary | ICD-10-CM

## 2019-06-25 DIAGNOSIS — Z882 Allergy status to sulfonamides status: Secondary | ICD-10-CM

## 2019-06-25 DIAGNOSIS — I5032 Chronic diastolic (congestive) heart failure: Secondary | ICD-10-CM

## 2019-06-25 DIAGNOSIS — K9189 Other postprocedural complications and disorders of digestive system: Secondary | ICD-10-CM

## 2019-06-25 DIAGNOSIS — J449 Chronic obstructive pulmonary disease, unspecified: Secondary | ICD-10-CM

## 2019-06-25 DIAGNOSIS — M797 Fibromyalgia: Secondary | ICD-10-CM

## 2019-06-25 DIAGNOSIS — K859 Acute pancreatitis without necrosis or infection, unspecified: Secondary | ICD-10-CM

## 2019-06-25 LAB — COMPREHENSIVE METABOLIC PANEL
ALBUMIN: 4.1 g/dL (ref 3.5–5.0)
ALKALINE PHOSPHATASE: 117 U/L (ref 38–126)
ALT (SGPT): 16 U/L (ref <35)
ANION GAP: 11 mmol/L (ref 7–15)
AST (SGOT): 17 U/L (ref 14–38)
BILIRUBIN TOTAL: 0.5 mg/dL (ref 0.0–1.2)
BLOOD UREA NITROGEN: 16 mg/dL (ref 7–21)
BUN / CREAT RATIO: 22
CHLORIDE: 103 mmol/L (ref 98–107)
CO2: 30 mmol/L (ref 22.0–30.0)
CREATININE: 0.74 mg/dL (ref 0.60–1.00)
EGFR CKD-EPI AA FEMALE: >90 mL/min/{1.73_m2} (ref >=60)
EGFR CKD-EPI NON-AA FEMALE: 88 mL/min/{1.73_m2} (ref >=60)
POTASSIUM: 4.2 mmol/L (ref 3.5–5.0)
PROTEIN TOTAL: 7.6 g/dL (ref 6.5–8.3)
SODIUM: 144 mmol/L (ref 135–145)

## 2019-06-25 LAB — CBC W/ AUTO DIFF
BASOPHILS ABSOLUTE COUNT: 0 10*9/L (ref 0.0–0.1)
BASOPHILS RELATIVE PERCENT: 0.2 %
EOSINOPHILS ABSOLUTE COUNT: 0.2 10*9/L (ref 0.0–0.4)
EOSINOPHILS RELATIVE PERCENT: 1.7 %
HEMOGLOBIN: 13.5 g/dL (ref 13.5–16.0)
LARGE UNSTAINED CELLS: 1 % (ref 0–4)
LYMPHOCYTES ABSOLUTE COUNT: 3.3 10*9/L (ref 1.5–5.0)
LYMPHOCYTES RELATIVE PERCENT: 26.5 %
MEAN CORPUSCULAR HEMOGLOBIN CONC: 33.3 g/dL (ref 31.0–37.0)
MEAN CORPUSCULAR HEMOGLOBIN: 29 pg (ref 26.0–34.0)
MEAN CORPUSCULAR VOLUME: 87.1 fL (ref 80.0–100.0)
MEAN PLATELET VOLUME: 7.1 fL (ref 7.0–10.0)
MONOCYTES ABSOLUTE COUNT: 0.6 10*9/L (ref 0.2–0.8)
NEUTROPHILS ABSOLUTE COUNT: 8.1 10*9/L — ABNORMAL HIGH (ref 2.0–7.5)
PLATELET COUNT: 264 10*9/L (ref 150–440)
RED BLOOD CELL COUNT: 4.65 10*12/L (ref 4.00–5.20)
RED CELL DISTRIBUTION WIDTH: 15.7 % — ABNORMAL HIGH (ref 12.0–15.0)
WBC ADJUSTED: 12.3 10*9/L — ABNORMAL HIGH (ref 4.5–11.0)

## 2019-06-25 LAB — LIPASE: Triacylglycerol lipase:CCnc:Pt:Ser/Plas:Qn:: 1875 — ABNORMAL HIGH

## 2019-06-25 LAB — URINALYSIS WITH CULTURE REFLEX
KETONES UA: NEGATIVE
LEUKOCYTE ESTERASE UA: NEGATIVE
NITRITE UA: NEGATIVE
PH UA: 6 (ref 5.0–9.0)
PROTEIN UA: NEGATIVE
SPECIFIC GRAVITY UA: 1.025 (ref 1.005–1.040)
SQUAMOUS EPITHELIAL: 25 /HPF — ABNORMAL HIGH (ref 0–5)
UROBILINOGEN UA: 1
WBC UA: 5 /HPF (ref 0–5)

## 2019-06-25 LAB — COLOR

## 2019-06-25 LAB — MEAN CORPUSCULAR HEMOGLOBIN: Lab: 29

## 2019-06-25 LAB — BLOOD UREA NITROGEN: Urea nitrogen:MCnc:Pt:Ser/Plas:Qn:: 16

## 2019-06-25 MED ORDER — PEG-ELECTROLYTE SOLUTION 420 GRAM ORAL SOLUTION
0 refills | 0 days | Status: CP
Start: 2019-06-25 — End: 2019-06-26

## 2019-06-26 ENCOUNTER — Encounter
Admit: 2019-06-26 | Discharge: 2019-06-30 | Disposition: A | Discharge status: Home / Self Care | Payer: MEDICARE | Admitting: Hospitalist

## 2019-06-26 ENCOUNTER — Ambulatory Visit
Admit: 2019-06-26 | Discharge: 2019-06-30 | Disposition: A | Discharge status: Home / Self Care | Payer: MEDICARE | Admitting: Hospitalist

## 2019-06-26 DIAGNOSIS — I5032 Chronic diastolic (congestive) heart failure: Secondary | ICD-10-CM

## 2019-06-26 DIAGNOSIS — M797 Fibromyalgia: Secondary | ICD-10-CM

## 2019-06-26 DIAGNOSIS — J4520 Mild intermittent asthma, uncomplicated: Secondary | ICD-10-CM

## 2019-06-26 DIAGNOSIS — Z882 Allergy status to sulfonamides status: Secondary | ICD-10-CM

## 2019-06-26 DIAGNOSIS — K9189 Other postprocedural complications and disorders of digestive system: Secondary | ICD-10-CM

## 2019-06-26 DIAGNOSIS — G4733 Obstructive sleep apnea (adult) (pediatric): Secondary | ICD-10-CM

## 2019-06-26 DIAGNOSIS — Z9049 Acquired absence of other specified parts of digestive tract: Secondary | ICD-10-CM

## 2019-06-26 DIAGNOSIS — K861 Other chronic pancreatitis: Secondary | ICD-10-CM

## 2019-06-26 DIAGNOSIS — I110 Hypertensive heart disease with heart failure: Secondary | ICD-10-CM

## 2019-06-26 DIAGNOSIS — I2510 Atherosclerotic heart disease of native coronary artery without angina pectoris: Secondary | ICD-10-CM

## 2019-06-26 DIAGNOSIS — F17210 Nicotine dependence, cigarettes, uncomplicated: Secondary | ICD-10-CM

## 2019-06-26 DIAGNOSIS — K863 Pseudocyst of pancreas: Secondary | ICD-10-CM

## 2019-06-26 DIAGNOSIS — J449 Chronic obstructive pulmonary disease, unspecified: Secondary | ICD-10-CM

## 2019-06-26 DIAGNOSIS — Z20828 Contact with and (suspected) exposure to other viral communicable diseases: Secondary | ICD-10-CM

## 2019-06-26 DIAGNOSIS — Z955 Presence of coronary angioplasty implant and graft: Secondary | ICD-10-CM

## 2019-06-26 DIAGNOSIS — E669 Obesity, unspecified: Secondary | ICD-10-CM

## 2019-06-26 DIAGNOSIS — Z7951 Long term (current) use of inhaled steroids: Secondary | ICD-10-CM

## 2019-06-26 DIAGNOSIS — K219 Gastro-esophageal reflux disease without esophagitis: Secondary | ICD-10-CM

## 2019-06-26 DIAGNOSIS — I252 Old myocardial infarction: Secondary | ICD-10-CM

## 2019-06-26 DIAGNOSIS — Z6836 Body mass index (BMI) 36.0-36.9, adult: Secondary | ICD-10-CM

## 2019-06-26 DIAGNOSIS — R748 Abnormal levels of other serum enzymes: Secondary | ICD-10-CM

## 2019-06-26 DIAGNOSIS — K859 Acute pancreatitis without necrosis or infection, unspecified: Secondary | ICD-10-CM

## 2019-06-26 DIAGNOSIS — F329 Major depressive disorder, single episode, unspecified: Secondary | ICD-10-CM

## 2019-06-26 DIAGNOSIS — F419 Anxiety disorder, unspecified: Secondary | ICD-10-CM

## 2019-06-26 DIAGNOSIS — E785 Hyperlipidemia, unspecified: Secondary | ICD-10-CM

## 2019-06-26 LAB — CBC
HEMATOCRIT: 37.2 % (ref 36.0–46.0)
HEMOGLOBIN: 11.8 g/dL — ABNORMAL LOW (ref 12.0–16.0)
MEAN CORPUSCULAR HEMOGLOBIN: 29 pg (ref 26.0–34.0)
MEAN CORPUSCULAR VOLUME: 91.6 fL (ref 80.0–100.0)
MEAN PLATELET VOLUME: 7.6 fL (ref 7.0–10.0)
PLATELET COUNT: 243 10*9/L (ref 150–440)
RED BLOOD CELL COUNT: 4.06 10*12/L (ref 4.00–5.20)
RED CELL DISTRIBUTION WIDTH: 15.7 % — ABNORMAL HIGH (ref 12.0–15.0)
WBC ADJUSTED: 10.8 10*9/L (ref 4.5–11.0)

## 2019-06-26 LAB — COMPREHENSIVE METABOLIC PANEL
ALBUMIN: 3.2 g/dL — ABNORMAL LOW (ref 3.5–5.0)
ALT (SGPT): 14 U/L (ref <35)
AST (SGOT): 18 U/L (ref 14–38)
BILIRUBIN TOTAL: 0.7 mg/dL (ref 0.0–1.2)
BLOOD UREA NITROGEN: 20 mg/dL (ref 7–21)
BUN / CREAT RATIO: 25
CHLORIDE: 103 mmol/L (ref 98–107)
CO2: 25 mmol/L (ref 22.0–30.0)
CREATININE: 0.79 mg/dL (ref 0.60–1.00)
EGFR CKD-EPI AA FEMALE: >90 mL/min/{1.73_m2} (ref >=60)
EGFR CKD-EPI NON-AA FEMALE: 81 mL/min/{1.73_m2} (ref >=60)
GLUCOSE RANDOM: 84 mg/dL (ref 70–179)
POTASSIUM: 4.2 mmol/L (ref 3.5–5.0)
PROTEIN TOTAL: 6.2 g/dL — ABNORMAL LOW (ref 6.5–8.3)
SODIUM: 135 mmol/L (ref 135–145)

## 2019-06-26 LAB — PHOSPHORUS: Phosphate:MCnc:Pt:Ser/Plas:Qn:: 4.1

## 2019-06-26 LAB — MAGNESIUM: Magnesium:MCnc:Pt:Ser/Plas:Qn:: 1.6

## 2019-06-26 LAB — HEMOGLOBIN: Hemoglobin:MCnc:Pt:Bld:Qn:: 11.8 — ABNORMAL LOW

## 2019-06-26 LAB — ALBUMIN: Albumin:MCnc:Pt:Ser/Plas:Qn:: 3.2 — ABNORMAL LOW

## 2019-06-26 NOTE — Unmapped (Addendum)
63 year-old woman with CAD, chronic diastolic CHF, and history of ERCP-associated pancreatitis in January this year who has had recurrent pancreatitis and presented with abdominal pain similar to pain she experienced before with pancreatitis.  Her lipase was 1875 on admission.  CT showed pancreatitis and a known 2.9 cm fluid collection within the pancreas consistent with walled-off pancreatic necrosis.  She was treated supportively with IV fluids, bowel rest, and pain medications.  She was seen by GI Biliary who performed an ERCP on 9/14 that showed extravasation of contrast out of the pancreatic duct; a pancreatic duct stent was placed.  Following that, her abdominal pain improved, and she was able to tolerate solid food.  She will follow up with GI Biliary in about 6 weeks for a repeat ERCP with likely stent exchange.  She will undergo her previously scheduled routine screening colonoscopy next week.

## 2019-06-26 NOTE — Unmapped (Signed)
[facility]  Emergency Department Provider Note      ED Clinical Impression      Final diagnoses:   Acute pancreatitis, unspecified complication status, unspecified pancreatitis type (Primary)           Impression, ED Course, Assessment and Plan      Impression: Marie Quinn is a 63 y.o. female with a PMH of GERD, HLD, HTN, CAD s/p stent placement, MI, substance abuse, and chronic pancreatitis who presents for onset today at 1430 of acute-on-chronic upper abdominal pain and nausea c/w previous pancreatitis exacerbations.    On exam, the patient is uncomfortable appearing but in NAD. Her vitals are WNL and she is afebrile. HRRR, lungs CTAB, and normal work of breathing. She has epigastric TTP without rebound or guarding. Abdomen otherwise soft and nondistended.    Labs ordered in triage significant for lipase of 1,875 (elevated from 1,449 on 05/14/2019) and WBC of 12.3. Plan for CT A/P. Will give IVF, zofran, and dilaudid and reassess.     0530  Patient has required several doses of Dilaudid for pain control.  Continues to feel nauseous.  CT interpretation remains pending at this time secondary to PACS downtime.  Per my interpretation, I do not see evidence of acute necrosis or hemorrhage.  Given that the patient has persistent symptoms despite medical management, will plan for admission.  I discussed the case with hospitalist who will evaluate the patient for admission.     Additional Medical Decision Making     I have reviewed the vital signs and the nursing notes. Labs and radiology results that were available during my care of the patient were independently reviewed by me and considered in my medical decision making.   I independently visualized the radiology images.   I reviewed the patient's prior medical records (previous admission records).   Portions of this record have been created using Scientist, clinical (histocompatibility and immunogenetics). Dictation errors have been sought, but may not have been identified and corrected.  ____________________________________________         History        Chief Complaint  Abdominal Pain      HPI   Marie Quinn is a 63 y.o. female with a PMH of GERD, HLD, HTN, CAD s/p stent placement, MI, tobacco abuse, and chronic pancreatitis who presents for abdominal pain. Per chart review, patient was first diagnosed with pancreatitis in 10/2018 following biliary sphincterotomy and sludge removal. She was admitted 7/22-7/25 for acute pancreatitis with infected necrosis. On arrival she was found to have leukocytosis to 11.2 with lipase elevated to 1914. MRI showed necrotizing pancreatitis with a 2.9 cm necrotic collection in the pancreatic body. ERCP was deferred at that time.     The patient states that at about 1430 today, she developed abdominal spasms that has since progressed into constant upper abdominal pain with associated nausea. Pain is aggravated by holding still. In 2 weeks, she is scheduled for an ERCP and stent placement. Last pancreatitis exacerbation was in 04/2019. She notes that there was some concern that her chronic pancreatitis began when she started chlorthalidone, which she is no longer taking. She has been admitted every time but once in the past for pancreatitis. In the past, she has had pain relief with dilaudid. She denies fevers, vomiting, diarrhea, or constipation. Denies drinking EtOH.    Past Medical History:   Diagnosis Date   ??? Anxiety    ??? Arthritis     Adult life   ???  Asthma    ??? BPPV (benign paroxysmal positional vertigo)    ??? Chronic diastolic CHF (congestive heart failure) (CMS-HCC)    ??? COPD (chronic obstructive pulmonary disease) (CMS-HCC)    ??? Depression    ??? Fibromyalgia    ??? GERD (gastroesophageal reflux disease)     Adult life   ??? Heart disease    ??? Heart murmur    ??? Hyperlipidemia    ??? Hypertension    ??? Myocardial infarction (CMS-HCC) 2008    s/p stent placement x 1 at [facility]   ??? Obesity    ??? Panic disorder    ??? Substance abuse (CMS-HCC)    ??? Urge incontinence        Patient Active Problem List   Diagnosis   ??? 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 Hypertension   ??? Myocardial infarction (CMS-HCC)   ??? Mild intermittent asthma without complication   ??? Anxiety   ??? Depression   ??? Post traumatic stress disorder (PTSD)   ??? Fibromyalgia   ??? Panic disorder   ??? Chest pain   ??? Urge incontinence   ??? Back spasm   ??? Dyslipidemia   ??? Epigastric pain   ??? Nausea & vomiting   ??? Elevated liver enzymes   ??? Acute pancreatitis with infected necrosis   ??? Tobacco abuse   ??? Chronic diastolic CHF (congestive heart failure) (CMS-HCC)       Past Surgical History:   Procedure Laterality Date   ??? cardiac stent x 1     ??? CHOLECYSTECTOMY     ??? HYSTERECTOMY  1990    one ovary remains   ??? PR ERCP,SPHINCTEROTOMY N/A 11/12/2018    Procedure: ERCP; W/SPHINCTEROTOMY/PAPILLOTOMY;  Surgeon: Vonda Antigua, MD;  Location: [facility];  Service: Gastroenterology   ??? PR ERCP,W/REMOVAL STONE,BIL/PANCR DUCTS N/A 11/12/2018    Procedure: ERCP; W/ENDOSCOPIC RETROGRADE REMOVAL OF CALCULUS/CALCULI FROM BILIARY &/OR PANCREATIC DUCTS;  Surgeon: Vonda Antigua, MD;  [facility];  Service: Gastroenterology   ??? TONSILLECTOMY     ??? TUBAL LIGATION  1990    Tubal pregnacy         Current Facility-Administered Medications:   ???  ondansetron (ZOFRAN-ODT) disintegrating tablet 4 mg, 4 mg, Oral, Once, Sherryl Barters, MD  ???  sodium chloride 0.9% (NS) bolus 1,000 mL, 1,000 mL, Intravenous, Once, Dionne Bucy, MD    Current Outpatient Medications:   ???  albuterol 2.5 mg /3 mL (0.083 %) nebulizer solution, Inhale the contents of 1 vial (2.5 mg total) by nebulization every four (4) hours as needed for wheezing., Disp: 25 vial, Rfl: 2  ???  albuterol HFA 90 mcg/actuation inhaler, Inhale 2 puffs every four (4) hours as needed for wheezing., Disp: 18 g, Rfl: 1  ???  ALBUTEROL, BULK, MISC, Frequency:null Dosage:3   gm  Instructions:0.002 = gm 3 mL, inhalation, q6h, for wheezing, # 25 EA, 0, Print Requisition  Note:, Disp: , Rfl:   ???  amLODIPine (NORVASC) 10 MG tablet, Take 1 tablet (10 mg total) by mouth daily., Disp: 90 tablet, Rfl: 3  ???  aspirin (ECOTRIN) 81 MG tablet, Take 81 mg by mouth daily., Disp: , Rfl:   ???  atorvastatin (LIPITOR) 40 MG tablet, Take 1 tablet (40 mg total) by mouth daily., Disp: 90 tablet, Rfl: 3  ???  buPROPion (WELLBUTRIN SR) 150 MG 12 hr tablet, Take 1 tablet (150 mg  total) by mouth Two (2) times a day., Disp: 60 tablet, Rfl: 3  ???  citalopram (CELEXA) 40 MG tablet, Take 1 tablet (40 mg total) by mouth daily., Disp: 90 tablet, Rfl: 3  ???  empty container (SHARPS CONTAINER) Misc, Use as directed., Disp: 1 each, Rfl: 3  ???  empty container Misc, Use as directed to dispose of injectable medications, Disp: 1 each, Rfl: 3  ???  evolocumab (REPATHA SYRINGE) 140 mg/mL Syrg, Inject the contents of 1 syringe (140 mg) under the skin every fourteen (14) days., Disp: 6 mL, Rfl: 3  ???  hydrOXYzine (ATARAX) 25 MG tablet, Take 1 tablet (25 mg total) by mouth every six (6) hours as needed for anxiety and congestion., Disp: 90 tablet, Rfl: 3  ???  losartan (COZAAR) 100 MG tablet, Take 1 tablet (100 mg total) by mouth daily., Disp: 90 tablet, Rfl: 3  ???  metoprolol succinate (TOPROL-XL) 100 MG 24 hr tablet, Take 1 tablet (100 mg total) by mouth two (2) times a day. Repeat for elevated BP., Disp: 180 tablet, Rfl: 3  ???  omeprazole (PRILOSEC) 20 MG capsule, Take 1 capsule (20 mg total) by mouth two (2) times a day., Disp: 180 capsule, Rfl: 1  ???  orphenadrine (NORFLEX) 100 mg tablet, Take 1 tablet (100 mg total) by mouth two (2) times a day as needed for muscle spasms., Disp: 20 tablet, Rfl: 0  ???  peg-electrolyte soln (GOLYTELY) 420 gram SolR, Take by mouth as directed., Disp: 2 Bottle, Rfl: 0  ???  polyethylene glycol (GLYCOLAX) 17 gram/dose powder, Mix 1 capful (17g) in 4-8 ounces of water and drink by mouth daily., Disp: 238 g, Rfl: 3  ???  promethazine (PHENERGAN) 12.5 MG tablet, Take 1 tablet (12.5 mg total) by mouth every six (6) hours as needed for nausea. (Patient not taking: Reported on 06/10/2019), Disp: 20 tablet, Rfl: 0  ???  traMADoL (ULTRAM) 50 mg tablet, Take 1-1.5 tablets (50-75 mg total) by mouth Every six (6) hours. As needed for pain (Patient not taking: Reported on 06/10/2019), Disp: 20 tablet, Rfl: 0    Allergies  Iodine and iodide containing products, Statins-hmg-coa reductase inhibitors, Sulfa (sulfonamide antibiotics), and Oxycodone    Family History   Problem Relation Age of Onset   ??? Heart disease Father         Deacesed   ??? Coronary artery disease Father         quadruple bypass   ??? Mental illness Father    ??? Asthma Father    ??? COPD Father    ??? Depression Father    ??? Hypertension Father    ??? Cancer Sister    ??? Arthritis Daughter    ??? Depression Daughter    ??? Asthma Sister    ??? Cancer Sister         Colan cancer deceased   ??? COPD Sister    ??? Depression Sister    ??? Early death Sister         36 yrs old   ??? Cancer Paternal Aunt         Thyroid cancer   ??? Cancer Maternal Grandmother         Brain cancer   ??? Depression Mother    ??? Diabetes Mother    ??? Hypertension Mother    ??? Diabetes Brother    ??? Vision loss Maternal Aunt         Blind at birth   ??? Substance Abuse  Disorder Neg Hx    ??? Alcohol abuse Neg Hx    ??? Drug abuse Neg Hx        Social History  Social History     Tobacco Use   ??? Smoking status: Current Every Day Smoker     Years: 25.00     Types: Cigarettes     Last attempt to quit: 05/02/2019     Years since quitting: 0.1   ??? Smokeless tobacco: Never Used   ??? Tobacco comment: 3 cigarettes per day   Substance Use Topics   ??? Alcohol use: Not Currently     Frequency: Monthly or less   ??? Drug use: Not Currently     Types: Cocaine     Comment: not used cocaine in 13 years       Review of Systems  Constitutional: Negative for fever.  Eyes: Negative for visual changes.  ENT: Negative for sore throat.  Cardiovascular: Negative for chest pain.  Respiratory: Negative for shortness of breath.  Gastrointestinal: Positive for abdominal pain and nausea. Negative for vomiting or diarrhea.  Genitourinary: Negative for dysuria.   Musculoskeletal: Negative for back pain.  Skin: Negative for rash.  Neurological: Negative for headaches, focal weakness or numbness.     Physical Exam     ED Triage Vitals [06/25/19 2117]   Enc Vitals Group      BP 147/67      Pulse       SpO2 Pulse 72      Resp 20      Temp 36.8 ??C (98.2 ??F)      Temp Source Oral      SpO2 97 %       Constitutional: Alert and oriented. Uncomfortable appearing but in no distress.  Eyes: Conjunctivae are normal.  ENT       Head: Normocephalic and atraumatic.       Nose: No congestion.       Mouth/Throat: Mucous membranes are moist.       Neck: No stridor.  Hematological/Lymphatic/Immunilogical: No cervical lymphadenopathy.  Cardiovascular: Normal rate, regular rhythm. Normal and symmetric distal pulses are present in all extremities.  Respiratory: Normal respiratory effort. Breath sounds are normal.  Gastrointestinal: Epigastric TTP without rebound or guarding. Abdomen otherwise soft and nondistended.  Musculoskeletal: Normal range of motion in all extremities.       Right lower leg: No tenderness or edema.       Left lower leg: No tenderness or edema.  Neurologic: Normal speech and language. No gross focal neurologic deficits are appreciated.  Skin: Skin is warm, dry and intact. No rash noted.  Psychiatric: Mood and affect are normal. Speech and behavior are normal.     Radiology     CT Abdomen Pelvis Without Any  Contrast   Preliminary Result      -Overall similar appearance of pancreatitis compared to 05/04/2021, including degree of inflammatory stranding and 2.9 cm fluid collection within the pancreatic body now consistent with walled off necrosis. Evaluation for progressive necrosis and/or new fluid collections is limited secondary to lack of IV contrast.      -Prominent pancreatic duct adjacent to the fluid collection similar to prior and potentially secondary to mass effect by the collection.                   Documentation assistance was provided by Merwyn Katos, Scribe on June 26, 2019 at 12:28 AM for Shaune Leeks, MD.    June 26, 2019 6:38 AM. Documentation assistance provided by the scribe. I was present during the time the encounter was recorded. The information recorded by the scribe was done at my direction and has been reviewed and validated by me.        Sherryl Barters, MD  06/26/19 [phone]

## 2019-06-26 NOTE — Unmapped (Signed)
Pt to Ed for reports of RUQ abd pain, believes she has pancreatitis.  Is scheduled for an ERCP to have a stent placed.  C/o 8/10 abd pain with nausea

## 2019-06-26 NOTE — Unmapped (Signed)
[facility] Medicine   History and Physical    Assessment/Plan:    Active Problems:    Hypertension    Mild intermittent asthma without complication    Anxiety    Depression    Tobacco abuse    Chronic diastolic CHF (congestive heart failure) (CMS-HCC)    Recurrent acute pancreatitis      Marie Quinn is a 63 y.o. female with history of necrotizing pancreatitis and heart failure who presents with abdominal pain with CT imaging suggestive of walled off pancreatic necrosis.     Walled-Off Pancreatic Necrosis:  History of recurrent acute pancreatitis dating back to January 2020. First episode January 2020 following ERCP with biliary sphincterotomy and sludge removal.  Had episodes of pain following her initial admission throughout the winter, and was admitted to the hospital in July 2020 for ongoing pain again with elevated lipase.  MRI with necrotizing pancreatitis at that time (2.9 cm fluid collection in the pancreatic body, which is now walled off on repeat imaging this presentation).  She is followed by [facility] GI, last seen approximately two weeks ago with plan for repeat ERCP and pancreatic stent placement next week to address possible disruption of the pancreatic duct. Also plan for colonoscopy at that time given family history of colon cancer. Will consult GI team as they may wish to expedite this work up while she is inpatient.  For now, will continue fluids and manage pain.    - Consult GI biliary in AM, discuss additional imaging studies appropriate, likely MRCP  - NS @ 200 mL/hr (monitor closely for volume overload given history of HFpEF)  - Dilaudid 1 mg every 4 hours as needed   - NPO until further discussions with GI, may need further imaging or procedures today  - Chem 10 and CBC every 12 hours   - Strict I/O    Chronic Medical Conditions:  Mild Intermittent Asthma: Albuterol nebulizer PRN for wheezing, shortness of breath  Hypertension:  Hold antihypertensives for time being given borderline low blood pressures   HFpEF:  Echo 2019 with grade II systolic dysfunction. Hold metoprolol   CAD s/p PCI in 2008 to distal LAD: hold ASA and metoprolol, continue statin  Hyperlipidemia:  Atorvastatin 40mg  daily, Repatha every 2 weeks (last on 06/21/2019)  GERD:  Continue protonix twice daily  Tobacco Use Disorder:  Minimal cigarette use in recent months, consider nicotine patch if symptomatic  Anxiety/Depression:  Continue wellbutrin, citalopram  History of Substance Use Disorder:  Cocaine, no recent use per patient.    Daily Checklist:  Diet: NPO   VTE ppx: SCDs, holding in case of procedure   Access:  PIV  Code Status:  Full Code  Dispo:  Floor status      Floor time 35 minutes minutes, > 50% spent in counseling and coordination of care about the following issues:  acute pancreatitis, chronic medical conditions including anxiety and depression, tobacco use disorder, discussions with Gastroenterology.  ___________________________________________________________________    Chief Complaint  Chief Complaint   Patient presents with   ??? Abdominal Pain       HPI:  Marie Quinn is a 64 y.o. female with history of CAD s/p PCI (2008), HFpEF (grade II), hypertension, hyperlipidemia, mild intermittent asthma and recurrent pancreatitis with necrotizing pancreatitis in July 2020 who presents with recurrent abdominal pain, elevated lipase and CT imaging suggestive of walled off pancreatic necrosis.     Marie Quinn developed her first case of pancreatitis in January 2020 following ERCP  with biliary sphincterotomy and sludge removal.  Subsequently her pain improved and she was discharged home.  She reports some mild pain intermittently through the winter months and into the spring.  In July 2020, she developed severe pain and was admitted to [facility] again with elevated lipase and also evidence of necrotizing pancreatitis on MRI.  She was treated conservatively.  Following discharge, she continued to have some pain.  She followed up with GI in clinic approximately 2 weeks ago and there was concern that she may have obstruction of her pancreatic duct due to the pseudocyst, and also there was some concern for alternative etiology such as colorectal cancer (strong family history).  Plant was made to perform both ERCP with likely stenting of the pancreatic duct and colonoscopy next week.      Unfortunately, she developed worsening abdominal pain on 9/11.  The pain began in the epigastric region and radiated to the back.  Nausea but no vomiting.  She does not believe that she has been febrile.  Does not know when her last bowel movement was but it was normal.  She feels that the pain is similar to the pain she had in July.  She was with her daughter who urged her to present to the emergency department.    In the emergency department, patient presented with normal blood pressure, HR in the 60s, afebrile, SpO2 > 95% on room air.  She received 1L NS and dilaudid for pain.  CT abdomen without contrast was performed showing walled off pancreatic necrosis, although somewhat challenging to characterize in absence of contrast, and also prominent pancreatic duct adjacent to the fluid collection similar to prior and likely due to mass effect.     Patient denies any fevers, cough, chest pain, headaches.  She has had some fatigue.  She does not recall her last bowel movement but believes it was normal.  She has had nausea but no vomiting.        Allergies:  Iodine and iodide containing products, Statins-hmg-coa reductase inhibitors, Sulfa (sulfonamide antibiotics), and Oxycodone     Medications:   Prior to Admission medications    Medication Dose, Route, Frequency   albuterol 2.5 mg /3 mL (0.083 %) nebulizer solution Inhale the contents of 1 vial (2.5 mg total) by nebulization every four (4) hours as needed for wheezing.   albuterol HFA 90 mcg/actuation inhaler 2 puffs, Inhalation, Every 4 hours PRN   amLODIPine (NORVASC) 10 MG tablet 10 mg, Oral, Daily (standard) aspirin (ECOTRIN) 81 MG tablet 81 mg, Daily (standard)   atorvastatin (LIPITOR) 40 MG tablet 40 mg, Oral, Daily (standard)   buPROPion (WELLBUTRIN SR) 150 MG 12 hr tablet 150 mg, Oral, 2 times a day (standard)   citalopram (CELEXA) 40 MG tablet 40 mg, Oral, Daily (standard)   empty container (SHARPS CONTAINER) Misc Use as directed.   empty container Misc Use as directed to dispose of injectable medications   evolocumab (REPATHA SYRINGE) 140 mg/mL Syrg Inject the contents of 1 syringe (140 mg) under the skin every fourteen (14) days.   hydrOXYzine (ATARAX) 25 MG tablet Take 1 tablet (25 mg total) by mouth every six (6) hours as needed for anxiety and congestion.   losartan (COZAAR) 100 MG tablet 100 mg, Oral, Daily (standard)   metoprolol succinate (TOPROL-XL) 100 MG 24 hr tablet 100 mg, Oral, 2 times a day, Repeat for elevated BP.   omeprazole (PRILOSEC) 20 MG capsule 20 mg, Oral, 2 times a day   polyethylene  glycol (GLYCOLAX) 17 gram/dose powder Mix 1 capful (17g) in 4-8 ounces of water and drink by mouth daily.   promethazine (PHENERGAN) 12.5 MG tablet 12.5 mg, Oral, Every 6 hours PRN       Medical History:  Past Medical History:   Diagnosis Date   ??? Anxiety    ??? Arthritis     Adult life   ??? Asthma    ??? BPPV (benign paroxysmal positional vertigo)    ??? Chronic diastolic CHF (congestive heart failure) (CMS-HCC)    ??? COPD (chronic obstructive pulmonary disease) (CMS-HCC)    ??? Depression    ??? Fibromyalgia    ??? GERD (gastroesophageal reflux disease)     Adult life   ??? Heart disease    ??? Heart murmur    ??? Hyperlipidemia    ??? Hypertension    ??? Myocardial infarction (CMS-HCC) 2008    s/p stent placement x 1 at [facility]   ??? Obesity    ??? Panic disorder    ??? Substance abuse (CMS-HCC)    ??? Urge incontinence        Surgical History:  Past Surgical History:   Procedure Laterality Date   ??? cardiac stent x 1     ??? CHOLECYSTECTOMY     ??? HYSTERECTOMY  1990    one ovary remains   ??? PR ERCP,SPHINCTEROTOMY N/A 11/12/2018    Procedure: ERCP; W/SPHINCTEROTOMY/PAPILLOTOMY;  Surgeon: Vonda Antigua, MD;  [facility];  Service: Gastroenterology   ??? PR ERCP,W/REMOVAL STONE,BIL/PANCR DUCTS N/A 11/12/2018    Procedure: ERCP; W/ENDOSCOPIC RETROGRADE REMOVAL OF CALCULUS/CALCULI FROM BILIARY &/OR PANCREATIC DUCTS;  Surgeon: Vonda Antigua, MD;  Location: [facility];  Service: Gastroenterology   ??? TONSILLECTOMY     ??? TUBAL LIGATION  1990    Tubal pregnacy       Social History:  Social History     Social History Narrative   ??? Not on file     Social History     Tobacco Use   ??? Smoking status: Current Every Day Smoker     Years: 25.00     Types: Cigarettes     Last attempt to quit: 05/02/2019     Years since quitting: 0.1   ??? Smokeless tobacco: Never Used   ??? Tobacco comment: 3 cigarettes per day   Substance Use Topics   ??? Alcohol use: Not Currently     Frequency: Monthly or less   ??? Drug use: Not Currently     Types: Cocaine     Comment: not used cocaine in 13 years       Family History:  Family History   Problem Relation Age of Onset   ??? Heart disease Father         Deacesed   ??? Coronary artery disease Father         quadruple bypass   ??? Mental illness Father    ??? Asthma Father    ??? COPD Father    ??? Depression Father    ??? Hypertension Father    ??? Cancer Sister    ??? Arthritis Daughter    ??? Depression Daughter    ??? Asthma Sister    ??? Cancer Sister         Colan cancer deceased   ??? COPD Sister    ??? Depression Sister    ??? Early death Sister         67 yrs old   ???  Cancer Paternal Aunt         Thyroid cancer   ??? Cancer Maternal Grandmother         Brain cancer   ??? Depression Mother    ??? Diabetes Mother    ??? Hypertension Mother    ??? Diabetes Brother    ??? Vision loss Maternal Aunt         Blind at birth   ??? Substance Abuse Disorder Neg Hx    ??? Alcohol abuse Neg Hx    ??? Drug abuse Neg Hx        Review of Systems:  10 systems reviewed and are negative unless otherwise mentioned in HPI    Physical Exam:  Temp:  [36.8 ??C (98.2 ??F)] 36.8 ??C (98.2 ??F)  SpO2 Pulse:  [62-72] 62  Resp:  [20] 20  BP: (108-147)/(59-67) 108/59  SpO2:  [90 %-97 %] 97 %  There is no height or weight on file to calculate BMI.    Physical Exam:  General:  No apparent distress, anxious  HEENT:  Neck supple, no LAD  Lungs:  Normal work of breathing, good air entry bilaterally, no crackles or wheezing  CV:  RRR, no murmurs, gallops or rubs  Abdomen:  Tender to palpation in the epigastric region with light and deep palpation, mildly tender on the left flank, non tender in the lower quadrants, normoactive bowel sounds   Extremities:  No deformity, warm and well perfused  Skin:  No rashes, no concerning lesions, no bruising  Neuro:  No focal deficits,alert and oriented to person, place, time and situation  Psych:  Appropriate affect, normal thought content, normal speech    Test Results:  Data Review:    All lab results last 24 hours:    Recent Results (from the past 24 hour(s))   Urinalysis with Culture Reflex    Collection Time: 06/25/19 11:05 PM    Specimen: Clean Catch; Urine   Result Value Ref Range    Color, UA Yellow     Clarity, UA Clear     Specific Gravity, UA 1.025 1.005 - 1.040    pH, UA 6.0 5.0 - 9.0    Leukocyte Esterase, UA Negative Negative    Nitrite, UA Negative Negative    Protein, UA Negative Negative    Glucose, UA Negative Negative    Ketones, UA Negative Negative    Urobilinogen, UA 1.0 mg/dL 0.2 - 2.0 mg/dL    Bilirubin, UA Negative Negative    Blood, UA Trace (A) Negative    RBC, UA 2 <4 /HPF    WBC, UA 5 0 - 5 /HPF    Squam Epithel, UA 25 (H) 0 - 5 /HPF    Bacteria, UA Occasional (A) None Seen /HPF    Mucus, UA Many (A) None Seen /HPF   CBC w/ Differential    Collection Time: 06/25/19 11:10 PM   Result Value Ref Range    WBC 12.3 (H) 4.5 - 11.0 10*9/L    RBC 4.65 4.00 - 5.20 10*12/L    HGB 13.5 13.5 - 16.0 g/dL    HCT 16.1 09.6 - 04.5 %    MCV 87.1 80.0 - 100.0 fL    MCH 29.0 26.0 - 34.0 pg    MCHC 33.3 31.0 - 37.0 g/dL    RDW 40.9 (H) 81.1 - 15.0 %    MPV 7.1 7.0 - 10.0 fL    Platelet 264 150 - 440 10*9/L    Neutrophils %  65.7 %    Lymphocytes % 26.5 %    Monocytes % 4.8 %    Eosinophils % 1.7 %    Basophils % 0.2 %    Absolute Neutrophils 8.1 (H) 2.0 - 7.5 10*9/L    Absolute Lymphocytes 3.3 1.5 - 5.0 10*9/L    Absolute Monocytes 0.6 0.2 - 0.8 10*9/L    Absolute Eosinophils 0.2 0.0 - 0.4 10*9/L    Absolute Basophils 0.0 0.0 - 0.1 10*9/L    Large Unstained Cells 1 0 - 4 %   Comprehensive Metabolic Panel    Collection Time: 06/25/19 11:11 PM   Result Value Ref Range    Sodium 144 135 - 145 mmol/L    Potassium 4.2 3.5 - 5.0 mmol/L    Chloride 103 98 - 107 mmol/L    Anion Gap 11 7 - 15 mmol/L    CO2 30.0 22.0 - 30.0 mmol/L    BUN 16 7 - 21 mg/dL    Creatinine 2.95 6.21 - 1.00 mg/dL    BUN/Creatinine Ratio 22     EGFR CKD-EPI Non-African American, Female 88 >=60 mL/min/1.84m2    EGFR CKD-EPI African American, Female >90 >=60 mL/min/1.25m2    Glucose 94 70 - 179 mg/dL    Calcium 9.3 8.5 - 30.8 mg/dL    Albumin 4.1 3.5 - 5.0 g/dL    Total Protein 7.6 6.5 - 8.3 g/dL    Total Bilirubin 0.5 0.0 - 1.2 mg/dL    AST 17 14 - 38 U/L    ALT 16 <35 U/L    Alkaline Phosphatase 117 38 - 126 U/L   Lipase Level    Collection Time: 06/25/19 11:11 PM   Result Value Ref Range    Lipase 1,875 (H) 44 - 232 U/L       Imaging: Radiology studies were personally reviewed

## 2019-06-26 NOTE — Unmapped (Signed)
Care Management  Initial Transition Planning Assessment   Type of Residence: Mailing Address:  [address] [place] [address]  Contacts: Accompanied by: Alone   Extended Emergency Contact Information  Primary Emergency Contact: Rice,Stanley  Address: [address]           [place], [place] [address] [place]  Home Phone: [phone]  Relation: Friend  Secondary Emergency Contact: Lanier [place]  Home Phone: [phone]  Relation: Daughter    Patient Phone Number: [phone] (home)           Medical Provider(s): Olena Leatherwood, NP  Reason for Admission: Admitting Diagnosis:  Acute pancreatitis, unspecified complication status, unspecified pancreatitis type [K85.90]  Past Medical History:   has a past medical history of Anxiety, Arthritis, Asthma, BPPV (benign paroxysmal positional vertigo), Chronic diastolic CHF (congestive heart failure) (CMS-HCC), COPD (chronic obstructive pulmonary disease) (CMS-HCC), Depression, Fibromyalgia, GERD (gastroesophageal reflux disease), Heart disease, Heart murmur, Hyperlipidemia, Hypertension, Myocardial infarction (CMS-HCC) (2008), Obesity, Panic disorder, Substance abuse (CMS-HCC), and Urge incontinence.  Past Surgical History:   has a past surgical history that includes Tonsillectomy; cardiac stent x 1; Cholecystectomy; pr ercp,w/removal stone,bil/pancr ducts (N/A, 11/12/2018); pr ercp,sphincterotomy (N/A, 11/12/2018); Tubal ligation (1990); and Hysterectomy (1990).   Previous admit date: 05/05/2019    Primary Insurance- Payor: HUMANA MEDICARE ADV / Plan: HUMANA GOLD PLUS HMO / Product Type: *No Product type* /   Secondary Insurance ??? Secondary Insurance  MEDICAID [place]  Prescription Coverage ??? [employer] Medicare/Medicaid  Preferred Pharmacy - [facility] - [place], [place] - [address]  [place] [facility]  [facility]  [facility]  [id number] - [place], [place] - [address] [place] [address]    Transportation home: Private vehicle  Level of function prior to admission: Independent                 General  Care Manager assessed the patient by : In person interview with patient, Medical record review, Discussion with Clinical Care team  Orientation Level: Oriented X4  Who provides care at home?: Family member  Reason for referral: Discharge Planning    Contact/Decision [facility]  Extended Emergency Contact Information  Primary Emergency Contact: Rice,Stanley  Address: [address]           [place], [place] [address] [place]  Home Phone: [phone]  Relation: Friend  Secondary Emergency Contact: Caton,Valarie   [place]  Home Phone: [phone]  Relation: Daughter    Legal Next of Kin / Guardian / POA / Advance Directives     HCDM (patient stated preference): Caton,Valarie - Daughter - [phone]    Advance Directive (Medical Treatment)  Does patient have an advance directive covering medical treatment?: Patient does not have advance directive covering medical treatment.(Daughter and SO would be Decision makers per patient)  Reason patient does not have an advance directive covering medical treatment:: Patient does not wish to complete one at this time.    Health Care Decision Maker [HCDM] (Medical & Mental Health Treatment)  Healthcare Decision Maker: HCDM documented in the HCDM/Contact Info section.  Information offered on HCDM, Medical & Mental Health advance directives:: Patient declined information.    Advance Directive (Mental Health Treatment)  Does patient have an advance directive covering mental health treatment?: Patient does not have advance directive covering mental health treatment.    Patient Information  Lives with: Spouse/significant other    Type of Residence: Private residence       [address] [place] [address]         Support Systems: Children, Significant Other    Responsibilities/Dependents at home?: No Home Care services in place prior to admission?: No          Outpatient/Community Resources in place prior to admission: Clinic  Agency detail (Name/Phone #): [facility], [facility]    Equipment Currently Used at Home: none       Currently receiving outpatient dialysis?: No       Financial Information       Need for financial assistance?: No       Social Determinants of Health  Social Determinants of Health were addressed in provider documentation.  Please refer to patient history.  Social History     Socioeconomic History   ??? Marital status: Widowed     Spouse name: None   ??? Number of children: None   ??? Years of education: None   ??? Highest education level: None   Occupational History   ??? None   Social Needs   ??? Financial resource strain: Not hard at all   ??? Food insecurity     Worry: Never true     Inability: Never true   ??? Transportation needs     Medical: No     Non-medical: No   Tobacco Use   ??? Smoking status: Current Every Day Smoker     Years: 25.00     Types: Cigarettes     Last attempt to quit: 05/02/2019     Years since quitting: 0.1   ??? Smokeless tobacco: Never Used   ??? Tobacco comment: 3 cigarettes per day   Substance and Sexual Activity   ??? Alcohol use: Not Currently     Frequency: Monthly or less   ??? Drug use: Not Currently     Types: Cocaine     Comment: not used cocaine in 13 years   ??? Sexual activity: Not Currently     Partners: Male     Birth control/protection: Surgical     Comment: I have no desire but want too   Lifestyle   ??? Physical activity     Days per week: 0 days     Minutes per session: 0 min   ??? Stress: Very much   Relationships   ??? Social Wellsite geologist on phone: More than three times a week     Gets together: More than three times a week     Attends religious service: Never     Active member of club or organization: Yes     Attends meetings of clubs or organizations: 1 to 4 times per year     Relationship status: Living with partner   Other Topics Concern   ??? None   Social History Narrative   ??? None     Housing/Utilities   ??? Within the past 12 months, have you ever stayed: outside, in a car, in a tent, in an overnight shelter, or temporarily in someone else's home (i.e. couch-surfing)? No    ??? Are you worried about losing your housing? No    ??? Within the past 12 months, have you been unable to get utilities (heat, electricity) when it was really needed? No      Literacy   ??? How often do you need to have someone help you  when you read instructions, pamphlets, or other written material from your doctor or pharmacy? Never        Discharge Needs Assessment  Concerns to be Addressed: no discharge needs identified, denies needs/concerns at this time    Clinical Risk Factors: New Diagnosis    Barriers to taking medications: No([facility])    Prior overnight hospital stay or ED visit in last 90 days: No    Readmission Within the Last 30 Days: no previous admission in last 30 days         Anticipated Changes Related to Illness: none    Equipment Needed After Discharge: none    Discharge Facility/Level of Care Needs: other (see comments)(Home with self care)    Readmission  Risk of Unplanned Readmission Score:  %  Predictive Model Details           12% (Medium) Factors Contributing to Score   Calculated 06/26/2019 11:06 24% Number of active Rx orders is 25   [facility] Risk of Unplanned Readmission Model 23% Number of ED visits in last six months is 3     13% Number of hospitalizations in last year is 2     13% ECG/EKG order is present in last 6 months     9% Imaging order is present in last 6 months     8% Charlson Comorbidity Index is 5     7% Age is 63     3% Future appointment is scheduled     Readmitted Within the Last 30 Days? (No if blank)   Patient at risk for readmission?: No    Discharge Plan  Screen findings are: Care Manager reviewed the plan of the patient's care with the Multidisciplinary Team. No discharge planning needs identified at this time. Care Manager will continue to manage plan and monitor patient's progress with the team.    Expected Discharge Date: 06/28/2019    Expected Transfer from Critical Care:  N/A    Patient and/or family were provided with choice of facilities / services that are available and appropriate to meet post hospital care needs?: N/A       Initial Assessment complete?: Yes    Clenton Pare  June 26, 2019 11:27 AM

## 2019-06-27 LAB — COMPREHENSIVE METABOLIC PANEL
ALBUMIN: 3 g/dL — ABNORMAL LOW (ref 3.5–5.0)
ALKALINE PHOSPHATASE: 99 U/L (ref 38–126)
ALT (SGPT): 13 U/L (ref <35)
ANION GAP: 4 mmol/L — ABNORMAL LOW (ref 7–15)
AST (SGOT): 19 U/L (ref 14–38)
BILIRUBIN TOTAL: 0.8 mg/dL (ref 0.0–1.2)
BLOOD UREA NITROGEN: 16 mg/dL (ref 7–21)
BUN / CREAT RATIO: 24
CALCIUM: 8.5 mg/dL (ref 8.5–10.2)
CHLORIDE: 107 mmol/L (ref 98–107)
CO2: 25 mmol/L (ref 22.0–30.0)
CREATININE: 0.67 mg/dL (ref 0.60–1.00)
EGFR CKD-EPI AA FEMALE: >90 mL/min/{1.73_m2} (ref >=60)
EGFR CKD-EPI NON-AA FEMALE: >90 mL/min/{1.73_m2} (ref >=60)
GLUCOSE RANDOM: 79 mg/dL (ref 70–179)
POTASSIUM: 4.8 mmol/L (ref 3.5–5.0)
SODIUM: 136 mmol/L (ref 135–145)

## 2019-06-27 LAB — CBC
HEMATOCRIT: 34.1 % — ABNORMAL LOW (ref 36.0–46.0)
HEMOGLOBIN: 11 g/dL — ABNORMAL LOW (ref 12.0–16.0)
MEAN CORPUSCULAR HEMOGLOBIN: 29.4 pg (ref 26.0–34.0)
MEAN CORPUSCULAR VOLUME: 91.2 fL (ref 80.0–100.0)
MEAN PLATELET VOLUME: 7.8 fL (ref 7.0–10.0)
PLATELET COUNT: 210 10*9/L (ref 150–440)
RED BLOOD CELL COUNT: 3.74 10*12/L — ABNORMAL LOW (ref 4.00–5.20)
RED CELL DISTRIBUTION WIDTH: 15.6 % — ABNORMAL HIGH (ref 12.0–15.0)
WBC ADJUSTED: 9.6 10*9/L (ref 4.5–11.0)

## 2019-06-27 LAB — EGFR CKD-EPI AA FEMALE: Lab: >90

## 2019-06-27 LAB — MAGNESIUM: Magnesium:MCnc:Pt:Ser/Plas:Qn:: 1.7

## 2019-06-27 LAB — PHOSPHORUS: Phosphate:MCnc:Pt:Ser/Plas:Qn:: 3.3

## 2019-06-27 LAB — HEMOGLOBIN: Hemoglobin:MCnc:Pt:Bld:Qn:: 11 — ABNORMAL LOW

## 2019-06-27 NOTE — Unmapped (Signed)
Order was placed for a PIV by Venous Access Team (VAT).  Patient was assessed for placement of a PIV. Access was obtained. Blood return noted.  Dressing intact and device well secured.  Flushed with normal saline.  Pt advised to inform RN of any s/s of discomfort at the PIV site.    Workup / Procedure Time:  15 minutes       care RN was notified.       Thank you,     Franchot Gallo RN Venous Access Team

## 2019-06-27 NOTE — Unmapped (Signed)
Alert and oriented x 4, independent with adls, no falls and no injuries, cont ivf, remains npo escept her meds, pain is helped by present pain meds, pt going for gi test in am, will cont plan of care.  Problem: Adult Inpatient Plan of Care  Goal: Plan of Care Review  Outcome: Ongoing - Unchanged  Goal: Patient-Specific Goal (Individualization)  Outcome: Ongoing - Unchanged  Flowsheets (Taken 06/27/2019 1458)  Patient-Specific Goals (Include Timeframe): pt will verbalize decrease in pain an after intervention  Goal: Absence of Hospital-Acquired Illness or Injury  Outcome: Ongoing - Unchanged  Goal: Optimal Comfort and Wellbeing  Outcome: Ongoing - Unchanged  Goal: Readiness for Transition of Care  Outcome: Ongoing - Unchanged  Goal: Rounds/Family Conference  Outcome: Ongoing - Unchanged     Problem: Arrhythmia/Dysrhythmia (Heart Failure)  Goal: Stable Heart Rate and Rhythm  Outcome: Ongoing - Unchanged     Problem: Pain Acute  Goal: Optimal Pain Control  Outcome: Ongoing - Unchanged     Problem: Hypertension Comorbidity  Goal: Blood Pressure in Desired Range  Outcome: Ongoing - Unchanged

## 2019-06-27 NOTE — Unmapped (Signed)
BILIARY INPATIENT CONSULTATION H&P      Requesting Attending Physician:  Marie Quinn, *  Requesting Consult Service: [facility] Team 5    Reason for Consult:    Marie Quinn is a 63 y.o. female seen in consultation at the request of Dr. Janelle Quinn, * for walled-off pancreatic necrosis.    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 Given her intermittent symptoms over the past several months and CT findings similar to those from July, this likely represents more of a smoldering pancreatitis rather than another discrete episode of acute pancreatitis. In fact, given her lack of other clear etiologies, this may all be a result of her initial post-ERCP pancreatitis in January 2020. Nonetheless, given her current symptoms and current hospitalization, will plan to proceed tomorrow with ERCP for pancreatic duct placement +/- EUS for drainage of pancreatic fluid collection. I have informed Marie Quinn of this plan. We will plan to discuss the procedure further and consent once she arrives to GI procedures tomorrow.    - ok for liquids today as tolerated  - NPO at midnight  - hold DVT ppx at midnight  - supportive care for pancreatitis per primary team      Thank you for this consult. Discussed with Dr. Council Quinn. Will be discussed with Dr. Edyth Quinn prior to procedure. We will continue to follow along. Please page the GI Biliary consult pager at [phone] with any further questions or change in clinical condition. Marie Sou, MD  Gastroenterology & Hepatology Fellow, PGY-4  [place] [facility] [place]      HPI:     Chief Complaint: abdominal pain    History of Present Illness:   This is a 63 y.o. 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    She was initially hospitalized in January 2020 with abnormal liver tests elevated in a mixed pattern (AlkP 316, AST 546, ALT 380, Tbili 1.9) and was found to have mildly increased intrahepatic and extrahepatic biliary ductal dilation on MRCP. An ERCP was done and a biliary sphincterotomy was performed, sludge was found. She unfortunately developed post-ERCP pancreatitis. There was also concern that her liver tests may have been secondary to DILI related to Macrobid. They did improve after ERCP and with discontinuing Macrobid and normalized by early February. She had episodes of intermittent abdominal pain over the next several months before being hospitalized again in July with acute necrotizing pancreatitis. She improved with supportive care. Unclear etiology of pancreatitis at this time. She was seen in clinic by Marie Quinn on 06/10/19 at which time they discussed ERCP with pancreatic stent placement. This is scheduled for 9/25.     She presented to [facility] on 9/11 with complaints of worsening abdominal pain, nausea, vomiting.  The pain is localized to her epigastric region.  She rates it at least 8  out of 10.  It worsened a couple days prior to presentation.  She describes it as a spasm type pain.  She has had decreased p.o. intake.  She had some emesis before coming in but this is now resolved.  She denies fevers, diarrhea.  She does have intermittent constipation.  She has been off chlorthalidone since her last admission.     She has been afebrile and hemodynamically stable since arrival. Labs notable for WBC 9.6, normal liver tests, Lipase 1875. She has been NPO. She is receiving IV fluids, IV dilaudid, and IV zofran. She reports feeling about 50% better since arrival.    Review of Systems:  The balance of 12 systems reviewed is negative except as noted in the HPI.     Medical History:     Past Medical History:  Past Medical History:   Diagnosis Date   ??? Anxiety    ??? Arthritis     Adult life   ??? Asthma    ??? BPPV (benign paroxysmal positional vertigo)    ??? Chronic diastolic CHF (congestive heart failure) (CMS-HCC)    ??? COPD (chronic obstructive pulmonary disease) (CMS-HCC)    ??? Depression    ??? Fibromyalgia    ??? GERD (gastroesophageal reflux disease)     Adult life   ??? Heart disease    ??? Heart murmur    ??? Hyperlipidemia    ??? Hypertension    ??? Myocardial infarction (CMS-HCC) 2008    s/p stent [facility] x 1 at [facility]   ??? Obesity    ??? Panic disorder    ??? Substance abuse (CMS-HCC)    ??? Urge incontinence        Surgical History:  Past Surgical History:   Procedure Laterality Date   ??? cardiac stent x 1     ??? CHOLECYSTECTOMY     ??? HYSTERECTOMY  1990    one ovary remains   ??? PR ERCP,SPHINCTEROTOMY N/A 11/12/2018    Procedure: ERCP; W/SPHINCTEROTOMY/PAPILLOTOMY;  Surgeon: Marie Antigua, MD;  [facility];  Service: Gastroenterology   ??? PR ERCP,W/REMOVAL STONE,BIL/PANCR DUCTS N/A 11/12/2018    Procedure: ERCP; W/ENDOSCOPIC RETROGRADE REMOVAL OF CALCULUS/CALCULI FROM BILIARY &/OR PANCREATIC DUCTS;  Surgeon: Marie Antigua, MD;  Location: [facility];  Service: Gastroenterology   ??? TONSILLECTOMY     ??? TUBAL LIGATION  1990    Tubal pregnacy       Family History:  The patient's family history includes Arthritis in her daughter; Asthma in her father and sister; COPD in her father and sister; Cancer in her maternal grandmother, paternal aunt, sister, and sister; Coronary artery disease in her father; Depression in her daughter, father, mother, and sister; Diabetes in her brother and mother; Early death in her sister; Heart disease in her father; Hypertension in her father and mother; Mental illness in her father; Vision loss in her maternal aunt..    Medications:   Medications Prior to Admission   Medication Sig Dispense Refill Last Dose   ??? albuterol 2.5 mg /3 mL (0.083 %) nebulizer solution Inhale the contents of 1 vial (2.5 mg total) by nebulization every four (4) hours as needed for wheezing. 25 vial 2 Past Week at Unknown time   ??? albuterol HFA 90 mcg/actuation inhaler Inhale 2 puffs every four (4) hours as needed for wheezing. 18 g 1 Past Week at Unknown time   ??? amLODIPine (NORVASC) 10 MG tablet Take 1 tablet (10 mg total) by  mouth daily. 90 tablet 3 06/25/2019 at 9am   ??? aspirin (ECOTRIN) 81 MG tablet Take 81 mg by mouth daily.   06/25/2019 at night   ??? atorvastatin (LIPITOR) 40 MG tablet Take 1 tablet (40 mg total) by mouth daily. 90 tablet 3 Past Week at Unknown time   ??? buPROPion (WELLBUTRIN SR) 150 MG 12 hr tablet Take 1 tablet (150 mg total) by mouth Two (2) times a day. 60 tablet 3 06/25/2019 at Unknown time   ??? citalopram (CELEXA) 40 MG tablet Take 1 tablet (40 mg total) by mouth daily. 90 tablet 3 06/25/2019 at Unknown time   ??? empty container (SHARPS CONTAINER) Misc Use as directed. 1 each 3 Unknown at Unknown time   ??? empty container Misc Use as directed to dispose of injectable medications 1 each 3 Unknown at Unknown time   ??? evolocumab (REPATHA SYRINGE) 140 mg/mL Syrg Inject the contents of 1 syringe (140 mg) under the skin every fourteen (14) days. 6 mL 3 Past Week at 06/21/2019   ??? hydrOXYzine (ATARAX) 25 MG tablet Take 1 tablet (25 mg total) by mouth every six (6) hours as needed for anxiety and congestion. 90 tablet 3 06/25/2019 at Unknown time   ??? losartan (COZAAR) 100 MG tablet Take 1 tablet (100 mg total) by mouth daily. 90 tablet 3 06/25/2019 at Unknown time   ??? metoprolol succinate (TOPROL-XL) 100 MG 24 hr tablet Take 1 tablet (100 mg total) by mouth two (2) times a day. Repeat for elevated BP. 180 tablet 3 06/25/2019 at Unknown time   ??? omeprazole (PRILOSEC) 20 MG capsule Take 1 capsule (20 mg total) by mouth two (2) times a day. 180 capsule 1 06/25/2019 at Unknown time   ??? polyethylene glycol (GLYCOLAX) 17 gram/dose powder Mix 1 capful (17g) in 4-8 ounces of water and drink by mouth daily. 238 g 3 Past Week at Unknown time   ??? promethazine (PHENERGAN) 12.5 MG tablet Take 1 tablet (12.5 mg total) by mouth every six (6) hours as needed for nausea. 20 tablet 0 More than a month at Unknown time   ??? [EXPIRED] ondansetron (ZOFRAN-ODT) 8 MG disintegrating tablet Dissolve 1 tablet (8 mg total) by mouth  or (on tongue) every eight (8) hours as needed for nausea for up to 7 days. 21 tablet 0        Allergies:   Iodine and iodide containing products, Statins-hmg-coa reductase inhibitors, Sulfa (sulfonamide antibiotics), and Oxycodone    Social History:  Social History     Tobacco Use   ??? Smoking status: Current Every Day Smoker     Years: 25.00     Types: Cigarettes     Last attempt to quit: 05/02/2019     Years since quitting: 0.1   ??? Smokeless tobacco: Never Used   ??? Tobacco comment: 3 cigarettes per day   Substance Use Topics   ??? Alcohol use: Not Currently     Frequency: Monthly or less   ??? Drug use: Not Currently     Types: Cocaine     Comment: not used cocaine in 13 years       Objective:      Vital Signs/Weight:  Temp:  [36.4 ??C-37.7 ??C] 37.7 ??C  Heart Rate:  [65-87] 87  Resp:  [16-18] 18  BP: (100-113)/(54-62) 107/54  MAP (mmHg):  [77-78] 78  SpO2:  [93 %-97 %] 93 %  Wt Readings from Last 3 Encounters:   06/26/19 (!) 105.2 kg (231  lb 14.8 oz)   06/10/19 (!) 104.3 kg (230 lb)   06/01/19 (!) 103 kg (227 lb)       Physical Exam:  GEN: comfortable-appearing woman lying in bed in no acute distress  EYES: EOMI, no scleral icterus  ENT: ATNC, dry mucus membranes, OP clear  NECK: supple, no LAD  CV: NRRR, normal S1/S2, no murmurs appreciated  PULM: CTAB, normal WOB on room air  ABD: soft, NTND, +BS  EXT: warm, no peripheral edema  PSYCH: awake and alert, appropriate affect  NEURO: moves all 4 extremities, answers questions appropriately  SKIN: no rashes or jaundice    Diagnostic Studies:  I reviewed all pertinent diagnostic studies, including:      Labs:    Recent Labs     06/25/19  2310 06/26/19  2103 06/27/19  0556   WBC 12.3* 10.8 9.6   HGB 13.5 11.8* 11.0*   HCT 40.5 37.2 34.1*   PLT 264 243 210     Recent Labs     06/25/19  2311 06/26/19  2103 06/27/19  0556   NA 144 135 136   K 4.2 4.2 4.8   CL 103 103 107   BUN 16 20 16    CREATININE 0.74 0.79 0.67   GLU 94 84 79     Recent Labs     06/25/19  2311 06/26/19  2103 06/27/19  0556   PROT 7.6 6.2* 5.9*   ALBUMIN 4.1 3.2* 3.0*   AST 17 18 19    ALT 16 14 13    ALKPHOS 117 100 99   BILITOT 0.5 0.7 0.8     Lipase 1875    Imaging:   CT A/P 06/26/19:  -Overall similar to slight worsening of pancreatitis compared to 05/05/2019, including degree of inflammatory stranding and 2.9 cm fluid collection within the pancreatic body now consistent with walled off necrosis. Evaluation for progressive necrosis and/or new fluid collections is limited secondary to lack of IV contrast.  ??  -Prominent pancreatic duct adjacent to the fluid collection similar to prior and potentially secondary to mass effect by the collection.    Microbiology:  Covid negative 06/26/19      GI Procedures:   ERCP 11/12/18:    - The major papilla appeared normal.                     - The patient has had a cholecystectomy.                     - A biliary sphincterotomy was performed.                     - The biliary tree was swept and sludge was found.                     - No specimens collected.

## 2019-06-27 NOTE — Unmapped (Signed)
Patient AOX4, VSS throughout shift.  C/o pain relieved by PRN IV dilaudid and PRN tramadol and PRN tylenol. Pt c/o anxiety relieved by PRN atarax. Pt c/o new facial numbness, provider notified and came to access pt.  NPO upon arrival to unit. All meds given on time as ordered. Pt ambulated with steady gait to bathroom. Pt sleeping in bed. POC maintained. Will continue to monitor.    Problem: Adult Inpatient Plan of Care  Goal: Plan of Care Review  Outcome: Progressing  Goal: Patient-Specific Goal (Individualization)  Outcome: Progressing  Goal: Absence of Hospital-Acquired Illness or Injury  Outcome: Progressing  Goal: Optimal Comfort and Wellbeing  Outcome: Progressing  Goal: Readiness for Transition of Care  Outcome: Progressing  Goal: Rounds/Family Conference  Outcome: Progressing     Problem: Arrhythmia/Dysrhythmia (Heart Failure)  Goal: Stable Heart Rate and Rhythm  Outcome: Progressing     Problem: Pain Acute  Goal: Optimal Pain Control  Outcome: Progressing

## 2019-06-28 DIAGNOSIS — K859 Acute pancreatitis without necrosis or infection, unspecified: Secondary | ICD-10-CM

## 2019-06-28 DIAGNOSIS — M797 Fibromyalgia: Secondary | ICD-10-CM

## 2019-06-28 DIAGNOSIS — J4520 Mild intermittent asthma, uncomplicated: Secondary | ICD-10-CM

## 2019-06-28 DIAGNOSIS — R748 Abnormal levels of other serum enzymes: Secondary | ICD-10-CM

## 2019-06-28 DIAGNOSIS — I252 Old myocardial infarction: Secondary | ICD-10-CM

## 2019-06-28 DIAGNOSIS — F419 Anxiety disorder, unspecified: Secondary | ICD-10-CM

## 2019-06-28 DIAGNOSIS — F17210 Nicotine dependence, cigarettes, uncomplicated: Secondary | ICD-10-CM

## 2019-06-28 DIAGNOSIS — G4733 Obstructive sleep apnea (adult) (pediatric): Secondary | ICD-10-CM

## 2019-06-28 DIAGNOSIS — Z955 Presence of coronary angioplasty implant and graft: Secondary | ICD-10-CM

## 2019-06-28 DIAGNOSIS — I110 Hypertensive heart disease with heart failure: Secondary | ICD-10-CM

## 2019-06-28 DIAGNOSIS — Z882 Allergy status to sulfonamides status: Secondary | ICD-10-CM

## 2019-06-28 DIAGNOSIS — Z6836 Body mass index (BMI) 36.0-36.9, adult: Secondary | ICD-10-CM

## 2019-06-28 DIAGNOSIS — Z20828 Contact with and (suspected) exposure to other viral communicable diseases: Secondary | ICD-10-CM

## 2019-06-28 DIAGNOSIS — K219 Gastro-esophageal reflux disease without esophagitis: Secondary | ICD-10-CM

## 2019-06-28 DIAGNOSIS — E785 Hyperlipidemia, unspecified: Secondary | ICD-10-CM

## 2019-06-28 DIAGNOSIS — K861 Other chronic pancreatitis: Secondary | ICD-10-CM

## 2019-06-28 DIAGNOSIS — I5032 Chronic diastolic (congestive) heart failure: Secondary | ICD-10-CM

## 2019-06-28 DIAGNOSIS — F329 Major depressive disorder, single episode, unspecified: Secondary | ICD-10-CM

## 2019-06-28 DIAGNOSIS — I2510 Atherosclerotic heart disease of native coronary artery without angina pectoris: Secondary | ICD-10-CM

## 2019-06-28 DIAGNOSIS — J449 Chronic obstructive pulmonary disease, unspecified: Secondary | ICD-10-CM

## 2019-06-28 DIAGNOSIS — E669 Obesity, unspecified: Secondary | ICD-10-CM

## 2019-06-28 DIAGNOSIS — Z7951 Long term (current) use of inhaled steroids: Secondary | ICD-10-CM

## 2019-06-28 DIAGNOSIS — K863 Pseudocyst of pancreas: Secondary | ICD-10-CM

## 2019-06-28 DIAGNOSIS — Z9049 Acquired absence of other specified parts of digestive tract: Secondary | ICD-10-CM

## 2019-06-28 DIAGNOSIS — K9189 Other postprocedural complications and disorders of digestive system: Secondary | ICD-10-CM

## 2019-06-28 DIAGNOSIS — K8512 Biliary acute pancreatitis with infected necrosis: Secondary | ICD-10-CM

## 2019-06-28 LAB — COMPREHENSIVE METABOLIC PANEL
ALBUMIN: 3.1 g/dL — ABNORMAL LOW (ref 3.5–5.0)
ALKALINE PHOSPHATASE: 90 U/L (ref 38–126)
ALT (SGPT): 14 U/L (ref <35)
ANION GAP: 7 mmol/L (ref 7–15)
AST (SGOT): 19 U/L (ref 14–38)
BILIRUBIN TOTAL: 0.6 mg/dL (ref 0.0–1.2)
BLOOD UREA NITROGEN: 9 mg/dL (ref 7–21)
BUN / CREAT RATIO: 16
CHLORIDE: 106 mmol/L (ref 98–107)
CO2: 25 mmol/L (ref 22.0–30.0)
CREATININE: 0.57 mg/dL — ABNORMAL LOW (ref 0.60–1.00)
EGFR CKD-EPI AA FEMALE: >90 mL/min/{1.73_m2} (ref >=60)
EGFR CKD-EPI NON-AA FEMALE: >90 mL/min/{1.73_m2} (ref >=60)
GLUCOSE RANDOM: 85 mg/dL (ref 70–179)
POTASSIUM: 4.3 mmol/L (ref 3.5–5.0)
PROTEIN TOTAL: 6.1 g/dL — ABNORMAL LOW (ref 6.5–8.3)
SODIUM: 138 mmol/L (ref 135–145)

## 2019-06-28 LAB — BUN / CREAT RATIO: Urea nitrogen/Creatinine:MRto:Pt:Ser/Plas:Qn:: 16

## 2019-06-28 LAB — PHOSPHORUS: Phosphate:MCnc:Pt:Ser/Plas:Qn:: 3.3

## 2019-06-28 LAB — MAGNESIUM: Magnesium:MCnc:Pt:Ser/Plas:Qn:: 1.6

## 2019-06-28 LAB — CBC
HEMATOCRIT: 33.8 % — ABNORMAL LOW (ref 36.0–46.0)
HEMOGLOBIN: 10.6 g/dL — ABNORMAL LOW (ref 12.0–16.0)
MEAN CORPUSCULAR HEMOGLOBIN CONC: 31.2 g/dL (ref 31.0–37.0)
MEAN CORPUSCULAR HEMOGLOBIN: 28.6 pg (ref 26.0–34.0)
MEAN CORPUSCULAR VOLUME: 91.6 fL (ref 80.0–100.0)
MEAN PLATELET VOLUME: 8.2 fL (ref 7.0–10.0)
PLATELET COUNT: 216 10*9/L (ref 150–440)
RED BLOOD CELL COUNT: 3.69 10*12/L — ABNORMAL LOW (ref 4.00–5.20)
WBC ADJUSTED: 7.3 10*9/L (ref 4.5–11.0)

## 2019-06-28 LAB — HEMATOCRIT: Hematocrit:VFr:Pt:Bld:Qn:: 33.8 — ABNORMAL LOW

## 2019-06-28 NOTE — Unmapped (Signed)
Tobacco Treatment Program  Phone Visit Note    This medical encounter was conducted virtually using [email or web address]  TeleHealth protocols.      I have identified myself to the patient and conveyed my credentials to Ms. Cena  I have explained the capabilities and limitations of telemedicine and the patient/proxy and myself both agree that it is appropriate for their current circumstances/symptoms. The patient/proxy has given me verbal consent to proceed.     Patient's location at the time of the telephone visit: Hospitalized at [facility]   Provider's location at the time of the telephone visit: At home, in [facility] Information  Person Contacted: Ms. Angelique Chevalier Phone number: [phone]       Phone Outcome: Talked to Pt   Is there someone else in the room? No.      Purpose of contact:     Ms. Canady is a 63 y.o. female is participating in a telephone visit for tobacco treatment counseling. Patient consented to telephone visit because of social distancing measures in place due to the COVID-19 pandemic.     NOTE:     SUMMARY: Sw utilized Motivational Interviewing techniques to assess Pt for treatment of tobacco use. Pt expressed that she's gradually reduced tobacco use in the last 2 weeks. Pt stated that she previously smoked 1ppd and currently smokes 1 cigarette a day. Pt reported that nicotine patches have been helpful in her efforts to minimize tobacco use. Pt hopes to continue reducing tobacco use until she is tobacco-free. Sw validated and gave Pt praise for success with significantly decreasing tobacco use. Pt is interested in trying nicotine gum and nicotine lozenges to assist her in managing cravings/withdrawal symptoms.  SW provided pscyhoeducation on 7 FDA approved cessation medications, and Pt elected to try 21mg  nicotine patch + 4mg  nicotine lozenge; SW will request Rx from Pt's medical provider. We discussed behavioral strategies to assist with managing urges and triggers. Sw and Pt discussed behavioral distractions associated with engaging in repetitive or simple activities whenever urges emerge. Sw encouraged Pt to be mindful of triggers and to enact oral strategies that incorporate the use of toothpicks, drinking straws, hard candy or lollipops to assist in alleviating cravings. Sw and pt discussed reorganizing some parts of their day and incorporating use oral strategies whenever cravings emerge to promote tobacco free lifestyle and to disassociate daily activities from tobacco use. SW provided pt with information on physical improvements related to tobacco cessation, and available resources.       Tobacco Use Treatment  Program: Hospital Inpatient  Type of Visit: Initial  Tobacco Use Treatment Visit: Talked with patient  Goals Of Session: Communication of feelings, Assessment, Insight, increase, Problem-solving skill development, Risk behaviors, modulate, Relapse prevention skills training, Communication skills, improve, Stress management, increase    Tobacco Use During Past 30 Days  Time Since Last Tobacco Use: 1 to 7 days ago  Tobacco Withdrawal (Past 24 Hours): None noted  Type of Tobacco Products Used: Cigarettes  Quantity Used: 1.5  Quantity Per: day  Time to First Use After Waking: Immediately  Smoking Allowed in Home: Yes    Tobacco Use History  Medications Used in Past Attempts: Nicotine Patch    Behavioral Assessment  Why Uses: 1. habit 2. pt enjoys in the morning or after meals  Barriers/Challenges: 1. long-standing habit 2. tobacco use ingrained in daily routines/activities  Strategies: 1. Sw provided pscyhoeducation on 7 FDA approved cessation medications and guidelines for effective use of medication 2 Sw and Pt discussed behavioral distractions associated with engaging in repetitive or simple activities whenever urges emerge 3. Sw encouraged Pt to be mindful of triggers and to enact oral strategies that incorporate the use of toothpicks, drinking straws, hard candy or lollipops to assist in alleviating cravings 4. Sw and pt discussed reorganizing some parts of their day and incorporating use oral strategies whenever cravings emerge to promote tobacco free lifestyle and to disassociate daily activities from tobacco use     Treatment Plan  Cessation Meds Currently Using: None  Medications Recommended During Hospitalization: Patient declines  Outpatient/Discharge Medications Recommended: Patient declines  Plan to Obtain Outpatient Meds: Pt already has medication  Patient's Plan Post Discharge/Visit: Plan to quit as soon as possible  Comments/Notes: Pt gradually reducing for the last 2 weeks, previously smoked 1ppd currently smokes 1cpd     TTS Information  Diagnosis: Tobacco use disorder, unspecified, uncomplicated (F17.200)  Interventions: Assessed, Behavioral techniques, Discussed, Informed, Parent education  TTS Visit Length: >10 minutes      As part of this Telephone Visit, no in-person exam was conducted.    I spent 10 minutes on the telephone with the patient.   I spent an additional 6 minutes on pre- and post-visit activities (including charting, communication with medical team, and coordination of aftercare).      Visit Format/Coding: Telephone Call (Audio Only): Behavioral Health/Non-Physicians:  In charge capture section use [phone] (11-20 minutes)    Donita Brooks, MSW, LCSW, NCTTP  Clinical Social Worker/ Tobacco Treatment Specialist  Inpatient Tobacco Treatment Program   Phone: [phone]  Pager: [phone]

## 2019-06-28 NOTE — Unmapped (Signed)
Hospitalist Daily Progress Note     LOS: 2 days under inpatient status    Assessment/Plan:  Active Problems:    Hypertension    Mild intermittent asthma without complication    Anxiety    Depression    Tobacco abuse    Chronic diastolic CHF (congestive heart failure) (CMS-HCC)    Recurrent acute pancreatitis  Resolved Problems:    * No resolved hospital problems. *         *Recurrent pancreatitis w walled-off pancreatic necrosis.  Complication of post-ERCP pancreatitis 10/2018.  CT images reviewed by me.  Was due for ERCP and likely pancreatic duct stenting next wk for possible pancreatic duct disruption (along w screening colonoscopy).   -Continue supportive care w IVF, IV opioids.   -Clear liquid diet at most while on IV opioids.   -If able, will trial PO opioids.  -Plan for ERCP today w likely pancreatic duct stenting.  -Appreciate GI???s assistance.     *Chronic diastolic CHF, HTN, CAD.  Most recent PCI 2008.   -Continue statin.   -Hold ASA given invasive procedure.  -Hold metoprolol, amlodipine given mild hypotension.     *Depression.   -Conitnue home bupropion, citalopram.     *Tobacco abuse.     *DVT prophylaxis.   -SCD???s.     *Dispo.  Pending above.        Please page the Med H5 hospitalist at [phone] with questions.      Consultants:   1.  GI Biliary          Subjective:   Acetaminophen x 1, hydromorphone x 4, hydroxyzine x 2, ondansetron x 2, tramadol x 4 in last 24 hrs.  Abd pain stable, more in epigastric and LUQ area, sharp, well-controlled w current pain meds.  Denies vomiting, diarrhea and constipation.      Today's labs personally reviewed by me.    Objective:   Physical Exam:  General: Lying comfortably in bed, no acute distress.   Eyes: No conjunctival injection or icterus.  Pupils equal.   ENT: Mucous membranes moist.  Normal appearing ears, nose.   Neck: Midline trachea.  No obvious goiter.   Respiratory: Normal respiratory effort.  Clear to auscultation bilaterally without wheezes or crackles. Cardiovascular: Regular rhythm w/o murmur.  Radial pulses 2+ bilaterally.  No significant lower extremity edema.   Gastrointestinal: Bowel sounds present, soft, nondistended.  Some tenderness in epigastrium and LUQ w/o rebound tenderness.  No hepatomegaly.   Skin: No obvious rashes or breakdown.  Warm, dry.   Musculoskeletal: Motor strength in all extremities grossly intact.   Psychiatric: Alert, oriented.  Answers questions appropriately.  Judgment and insight intact.   Neurologic: Extraocular movements intact, no facial asymmetry.  No gross sensory deficits.         Vital signs in last 24 hours:  Temp:  [36.3 ??C (97.3 ??F)-36.6 ??C (97.9 ??F)] 36.3 ??C (97.3 ??F)  Heart Rate:  [68-86] 68  Resp:  [18-20] 18  BP: (100-134)/(57-68) 124/60  MAP (mmHg):  [83-91] 83  SpO2:  [89 %-97 %] 95 %    Intake/Output last 24 hours:    Intake/Output Summary (Last 24 hours) at 06/28/2019 0630  Last data filed at 06/27/2019 2000  Gross per 24 hour   Intake 200 ml   Output ???   Net 200 ml       Most recent recorded weights:  Last 5 Recorded Weights    06/26/19 1019   Weight: (!) 105.2 kg (231 lb  14.8 oz)       Medications:   Scheduled Meds:  ??? atorvastatin  40 mg Oral Daily   ??? buPROPion  150 mg Oral BID   ??? citalopram  40 mg Oral Daily   ??? pantoprazole  20 mg Oral Daily   ??? polyethylen glycol  17 g Oral BID   ??? senna  2 tablet Oral Nightly     Continuous Infusions:  ??? sodium chloride 125 mL/hr (06/27/19 1551)

## 2019-06-28 NOTE — Unmapped (Signed)
Patient AOX4, VSS throughout shift.  Pt placed on 2LNC o/n to maintain O2 sats.  C/o pain relieved by PRN IV dilaudid and PRN tramadol. Pt c/o anxiety relieved by PRN atarax. Facial numbness still present.  Pt ate dinner last night and was NPO since 12am. All meds given on time as ordered. Pt ambulated with steady gait to bathroom. Pt sleeping in bed. POC maintained. Will continue to monitor.    Problem: Adult Inpatient Plan of Care  Goal: Plan of Care Review  Outcome: Progressing  Goal: Patient-Specific Goal (Individualization)  Outcome: Progressing  Goal: Absence of Hospital-Acquired Illness or Injury  Outcome: Progressing  Goal: Optimal Comfort and Wellbeing  Outcome: Progressing  Goal: Readiness for Transition of Care  Outcome: Progressing  Goal: Rounds/Family Conference  Outcome: Progressing     Problem: Arrhythmia/Dysrhythmia (Heart Failure)  Goal: Stable Heart Rate and Rhythm  Outcome: Progressing     Problem: Pain Acute  Goal: Optimal Pain Control  Outcome: Progressing     Problem: Hypertension Comorbidity  Goal: Blood Pressure in Desired Range  Outcome: Progressing

## 2019-06-28 NOTE — Unmapped (Addendum)
Pt resting in hospital bed throughout shift. Pt remained NPO for ERCP, now on clear liquid diet. Pt returned from OR A&Ox4, skin in tact, VSS. Pt required PRN analgesia during shift with good effect. Bed in low, locked position, bedside table and call bell within reach, nonskid socks on throughout shift. VSS. Pt has remained free of injury. Will report to oncoming shift.     Problem: Pain Acute  Goal: Optimal Pain Control  Outcome: Ongoing - Unchanged     Problem: Adult Inpatient Plan of Care  Goal: Optimal Comfort and Wellbeing  Outcome: Ongoing - Unchanged     Problem: Adult Inpatient Plan of Care  Goal: Absence of Hospital-Acquired Illness or Injury  Outcome: Ongoing - Unchanged

## 2019-06-28 NOTE — Unmapped (Signed)
[facility]   History and Physical    Assessment/Plan:    Active Problems:    Hypertension    Mild intermittent asthma without complication    Anxiety    Depression    Tobacco abuse    Chronic diastolic CHF (congestive heart failure) (CMS-HCC)    Recurrent acute pancreatitis      Marie Quinn is a 63 y.o. female with history of necrotizing pancreatitis and heart failure who presents with abdominal pain with CT imaging suggestive of walled off pancreatic necrosis.     Walled-Off Pancreatic Necrosis:  History of recurrent acute pancreatitis dating back to January 2020. First episode January 2020 following ERCP with biliary sphincterotomy and sludge removal.  Had episodes of pain following her initial admission throughout the winter, and was admitted to the hospital in July 2020 for ongoing pain again with elevated lipase.  MRI with necrotizing pancreatitis at that time (2.9 cm fluid collection in the pancreatic body, which is now walled off on repeat imaging this presentation).  She is followed by [facility], last seen approximately two weeks ago with plan for repeat ERCP and pancreatic stent placement next week to address possible disruption of the pancreatic duct. Also plan for colonoscopy at that time given family history of colon cancer. Will consult GI team as they may wish to expedite this work up while she is inpatient.  For now, will continue fluids and manage pain.    - GI biliary consulted, planning for 9/14 to perform EUS, possible ERCP and uncertain what ultimately will be required in procedure to drain necrotic area  - NS @ 250 mL/hr (monitor closely for volume overload given history of HFpEF)  - Dilaudid 1 mg every 4 hours as needed   - NPO after midnight      Chronic Medical Conditions:  Mild Intermittent Asthma: Albuterol nebulizer PRN for wheezing, shortness of breath  Hypertension:  Hold antihypertensives for time being given borderline low blood pressures   HFpEF:  Echo 2019 with grade II systolic dysfunction. Hold metoprolol   CAD s/p PCI in 2008 to distal LAD: hold ASA and metoprolol, continue statin  Hyperlipidemia:  Atorvastatin 40mg  daily, Repatha every 2 weeks (last on 06/21/2019)  GERD:  Continue protonix twice daily  Tobacco Use Disorder:  Minimal cigarette use in recent months, consider nicotine patch if symptomatic  Anxiety/Depression:  Continue wellbutrin, citalopram  History of Substance Use Disorder:  Cocaine, no recent use per patient.    Daily Checklist:  Diet: NPO   VTE ppx: SCDs, holding lovenox in case of procedure   Access:  PIV  Code Status:  Full Code  Dispo:  Floor status      Floor time 35 minutes minutes, > 50% spent in counseling and coordination of care about the following issues:  acute pancreatitis, chronic medical conditions including anxiety and depression, tobacco use disorder, discussions with Gastroenterology.  ___________________________________________________________________    24 Hour Events:  Overnight patient reported facial numbness. Was evaluated by night provider who found her to have intact neurologic exam and numbness ultimately resolved, unclear what was occurring. This AM reports pain is well controlled on current pain regimen.       Physical Exam:  Temp:  [36.6 ??C (97.9 ??F)-37.7 ??C (99.9 ??F)] 36.6 ??C (97.9 ??F)  Heart Rate:  [71-87] 75  Resp:  [18] 18  BP: (100-109)/(54-62) 100/57  SpO2:  [93 %-94 %] 94 %  Body mass index is 36.32 kg/m??.    Physical  Exam:  General:  No apparent distress, anxious  HEENT:  Neck supple, no LAD  Lungs:  Normal work of breathing, good air entry bilaterally, no crackles or wheezing  CV:  RRR, no murmurs, gallops or rubs  Abdomen:  Tender to palpation in the epigastric region with light and deep palpation, mildly tender on the left flank, non tender in the lower quadrants, normoactive bowel sounds   Extremities:  No deformity, warm and well perfused  Skin:  No rashes, no concerning lesions, no bruising  Neuro:  No focal deficits,alert and oriented to person, place, time and situation  Psych:  Appropriate affect, normal thought content, normal speech      Test Results:  Data Review:    All lab results last 24 hours:    Recent Results (from the past 24 hour(s))   Magnesium Level    Collection Time: 06/26/19  9:03 PM   Result Value Ref Range    Magnesium 1.6 1.6 - 2.2 mg/dL   Phosphorus Level    Collection Time: 06/26/19  9:03 PM   Result Value Ref Range    Phosphorus 4.1 2.9 - 4.7 mg/dL   Comprehensive metabolic panel    Collection Time: 06/26/19  9:03 PM   Result Value Ref Range    Sodium 135 135 - 145 mmol/L    Potassium 4.2 3.5 - 5.0 mmol/L    Chloride 103 98 - 107 mmol/L    Anion Gap 7 7 - 15 mmol/L    CO2 25.0 22.0 - 30.0 mmol/L    BUN 20 7 - 21 mg/dL    Creatinine 1.61 0.96 - 1.00 mg/dL    BUN/Creatinine Ratio 25     EGFR CKD-EPI Non-African American, Female 81 >=60 mL/min/1.63m2    EGFR CKD-EPI African American, Female >90 >=60 mL/min/1.88m2    Glucose 84 70 - 179 mg/dL    Calcium 8.1 (L) 8.5 - 10.2 mg/dL    Albumin 3.2 (L) 3.5 - 5.0 g/dL    Total Protein 6.2 (L) 6.5 - 8.3 g/dL    Total Bilirubin 0.7 0.0 - 1.2 mg/dL    AST 18 14 - 38 U/L    ALT 14 <35 U/L    Alkaline Phosphatase 100 38 - 126 U/L   CBC    Collection Time: 06/26/19  9:03 PM   Result Value Ref Range    WBC 10.8 4.5 - 11.0 10*9/L    RBC 4.06 4.00 - 5.20 10*12/L    HGB 11.8 (L) 12.0 - 16.0 g/dL    HCT 04.5 40.9 - 81.1 %    MCV 91.6 80.0 - 100.0 fL    MCH 29.0 26.0 - 34.0 pg    MCHC 31.6 31.0 - 37.0 g/dL    RDW 91.4 (H) 78.2 - 15.0 %    MPV 7.6 7.0 - 10.0 fL    Platelet 243 150 - 440 10*9/L   Magnesium Level    Collection Time: 06/27/19  5:56 AM   Result Value Ref Range    Magnesium 1.7 1.6 - 2.2 mg/dL   Phosphorus Level    Collection Time: 06/27/19  5:56 AM   Result Value Ref Range    Phosphorus 3.3 2.9 - 4.7 mg/dL   Comprehensive metabolic panel    Collection Time: 06/27/19  5:56 AM   Result Value Ref Range    Sodium 136 135 - 145 mmol/L    Potassium 4.8 3.5 - 5.0 mmol/L    Chloride 107 98 - 107 mmol/L  Anion Gap 4 (L) 7 - 15 mmol/L    CO2 25.0 22.0 - 30.0 mmol/L    BUN 16 7 - 21 mg/dL    Creatinine 0.98 1.19 - 1.00 mg/dL    BUN/Creatinine Ratio 24     EGFR CKD-EPI Non-African American, Female >90 >=60 mL/min/1.79m2    EGFR CKD-EPI African American, Female >90 >=60 mL/min/1.53m2    Glucose 79 70 - 179 mg/dL    Calcium 8.5 8.5 - 14.7 mg/dL    Albumin 3.0 (L) 3.5 - 5.0 g/dL    Total Protein 5.9 (L) 6.5 - 8.3 g/dL    Total Bilirubin 0.8 0.0 - 1.2 mg/dL    AST 19 14 - 38 U/L    ALT 13 <35 U/L    Alkaline Phosphatase 99 38 - 126 U/L   CBC    Collection Time: 06/27/19  5:56 AM   Result Value Ref Range    WBC 9.6 4.5 - 11.0 10*9/L    RBC 3.74 (L) 4.00 - 5.20 10*12/L    HGB 11.0 (L) 12.0 - 16.0 g/dL    HCT 82.9 (L) 56.2 - 46.0 %    MCV 91.2 80.0 - 100.0 fL    MCH 29.4 26.0 - 34.0 pg    MCHC 32.3 31.0 - 37.0 g/dL    RDW 13.0 (H) 86.5 - 15.0 %    MPV 7.8 7.0 - 10.0 fL    Platelet 210 150 - 440 10*9/L   POCT Glucose    Collection Time: 06/27/19  4:26 PM   Result Value Ref Range    Glucose, POC 61 (L) 70 - 179 mg/dL

## 2019-06-29 LAB — CBC
HEMATOCRIT: 33.6 % — ABNORMAL LOW (ref 36.0–46.0)
HEMOGLOBIN: 11 g/dL — ABNORMAL LOW (ref 12.0–16.0)
MEAN CORPUSCULAR HEMOGLOBIN CONC: 32.6 g/dL (ref 31.0–37.0)
MEAN CORPUSCULAR VOLUME: 89.5 fL (ref 80.0–100.0)
MEAN PLATELET VOLUME: 7.7 fL (ref 7.0–10.0)
PLATELET COUNT: 223 10*9/L (ref 150–440)
RED BLOOD CELL COUNT: 3.76 10*12/L — ABNORMAL LOW (ref 4.00–5.20)
RED CELL DISTRIBUTION WIDTH: 15.2 % — ABNORMAL HIGH (ref 12.0–15.0)
WBC ADJUSTED: 5.9 10*9/L (ref 4.5–11.0)

## 2019-06-29 LAB — COMPREHENSIVE METABOLIC PANEL
ALBUMIN: 2.9 g/dL — ABNORMAL LOW (ref 3.5–5.0)
ALT (SGPT): 15 U/L (ref <35)
ANION GAP: 5 mmol/L — ABNORMAL LOW (ref 7–15)
AST (SGOT): 22 U/L (ref 14–38)
BILIRUBIN TOTAL: 0.5 mg/dL (ref 0.0–1.2)
BLOOD UREA NITROGEN: 4 mg/dL — ABNORMAL LOW (ref 7–21)
BUN / CREAT RATIO: 9
CALCIUM: 8.4 mg/dL — ABNORMAL LOW (ref 8.5–10.2)
CHLORIDE: 104 mmol/L (ref 98–107)
CO2: 28 mmol/L (ref 22.0–30.0)
CREATININE: 0.47 mg/dL — ABNORMAL LOW (ref 0.60–1.00)
EGFR CKD-EPI AA FEMALE: >90 mL/min/{1.73_m2} (ref >=60)
EGFR CKD-EPI NON-AA FEMALE: >90 mL/min/{1.73_m2} (ref >=60)
GLUCOSE RANDOM: 111 mg/dL (ref 70–179)
POTASSIUM: 3.8 mmol/L (ref 3.5–5.0)
PROTEIN TOTAL: 5.8 g/dL — ABNORMAL LOW (ref 6.5–8.3)
SODIUM: 137 mmol/L (ref 135–145)

## 2019-06-29 LAB — RED BLOOD CELL COUNT: Lab: 3.76 — ABNORMAL LOW

## 2019-06-29 LAB — CREATININE: Creatinine:MCnc:Pt:Ser/Plas:Qn:: 0.47 — ABNORMAL LOW

## 2019-06-29 NOTE — Unmapped (Signed)
No acute events this shift.  Tolerated scheduled meds.  Remains on RA.  Self ambulates to bathroom.  On cont NS infusion.  Remains on clear liquid diet.  POC continues.    Problem: Adult Inpatient Plan of Care  Goal: Plan of Care Review  Outcome: Progressing  Goal: Patient-Specific Goal (Individualization)  Outcome: Progressing  Goal: Absence of Hospital-Acquired Illness or Injury  Outcome: Progressing  Goal: Optimal Comfort and Wellbeing  Outcome: Progressing  Goal: Readiness for Transition of Care  Outcome: Progressing  Goal: Rounds/Family Conference  Outcome: Progressing     Problem: Arrhythmia/Dysrhythmia (Heart Failure)  Goal: Stable Heart Rate and Rhythm  Outcome: Progressing     Problem: Pain Acute  Goal: Optimal Pain Control  Outcome: Progressing     Problem: Hypertension Comorbidity  Goal: Blood Pressure in Desired Range  Outcome: Progressing

## 2019-06-29 NOTE — Unmapped (Signed)
Order was placed for a PIV by Venous Access Team (VAT).  Patient was assessed for placement of a PIV. Access was obtained. Blood return noted.  Dressing intact and device well secured.  Flushed with normal saline.  Pt advised to inform RN of any s/s of discomfort at the PIV site.    Workup / Procedure Time:  15 minutes        RN was notified.       Thank you,     Fredderick Erb RN Venous Access Team

## 2019-06-29 NOTE — Unmapped (Signed)
Pt resting in hospital room throughout shift. Pt's pain better managed today, no PRN analgesics required. Pt tolerated solid foods without issue. Anticipate d/c home tomorrow if pt continues to be asymptomatic. Bed in low, locked position, bedside table and call bell within reach, nonskid socks on throughout shift. VSS. Pt has remained free of injury. Will report to oncoming shift.     Problem: Adult Inpatient Plan of Care  Goal: Optimal Comfort and Wellbeing  Outcome: Ongoing - Unchanged     Problem: Adult Inpatient Plan of Care  Goal: Readiness for Transition of Care  Outcome: Ongoing - Unchanged     Problem: Pain Acute  Goal: Optimal Pain Control  Outcome: Ongoing - Unchanged

## 2019-06-29 NOTE — Unmapped (Signed)
Hospitalist Daily Progress Note     LOS: 3 days under inpatient status    Assessment/Plan:  Active Problems:    Hypertension    Mild intermittent asthma without complication    Anxiety    Depression    Tobacco abuse    Chronic diastolic CHF (congestive heart failure) (CMS-HCC)    Recurrent acute pancreatitis  Resolved Problems:    * No resolved hospital problems. *         *Recurrent pancreatitis w walled-off pancreatic necrosis.  Much improved.  Complication of post-ERCP pancreatitis 10/2018.  Repeat ERCP 9/15 showed pancreatic duct disruption that was stented.  -Advance diet to solids.  -D/C IV opioids but will allow PRN opioids.  -Will need to F/U w GI Biliary for repeat ERCP + likely stent exchange in 6 wk.  -Appreciate GI???s assistance.   ??  *Chronic diastolic CHF, HTN, CAD.  Stable.  Most recent PCI 2008.   -Continue statin.   -Hold ASA given invasive procedure for now.  -Restart metoprolol (appears she was taking 100mg  XL qday and not BID as prescribed).  -Holding amlodipine, losartan given mild hypotension on admission but anticipate will be able to restart on D/C.  ??  *Depression.  Stable.  -Conitnue home bupropion, citalopram.   ??  *Tobacco abuse.   ??  *DVT prophylaxis.   -SCD???s.   ??  *Dispo.  Home pending above.  ??  ??  ??  Please page the Med H5 hospitalist at [phone] with questions.  ??  ??  Consultants:   1.  GI Biliary        Subjective:   Ondansetron x 1, hydromorphone PO x 1, hydromorphone IV x 1 in last 24 hrs.  Abd pain is much better and hasn't needed pain med today.  Would like to try regular food.  Denies lightheadedness, chest pain and shortness of breath.      Today's labs personally reviewed by me.      Objective:   Physical Exam:  General: Sitting comfortably in bed, no acute distress.   Eyes: No conjunctival injection or icterus.  Pupils equal.   ENT: Mucous membranes moist.  Normal appearing ears, nose.   Neck: Midline trachea.  No obvious goiter.   Respiratory: Normal respiratory effort. Clear to auscultation bilaterally without wheezes or crackles.    Cardiovascular: Regular rhythm w/o murmur.  Radial pulses 2+ bilaterally.  No significant lower extremity edema.   Gastrointestinal: Bowel sounds present, soft, nondistended.  No real tenderness.   Skin: No obvious rashes or breakdown.  Warm, dry.   Musculoskeletal: Motor strength in all extremities grossly intact.   Psychiatric: Alert, oriented.  Answers questions appropriately.  Judgment and insight intact.   Neurologic: Extraocular movements intact, no facial asymmetry.  No gross sensory deficits.         Vital signs in last 24 hours:  Temp:  [36.3 ??C (97.3 ??F)-36.8 ??C (98.2 ??F)] 36.5 ??C (97.7 ??F)  Heart Rate:  [66-89] 66  SpO2 Pulse:  [83-94] 83  Resp:  [16-20] 18  BP: (111-146)/(54-104) 116/87  MAP (mmHg):  [93-102] 97  SpO2:  [88 %-98 %] 98 %  BMI (Calculated):  [35.86] 35.86    Intake/Output last 24 hours:    Intake/Output Summary (Last 24 hours) at 06/29/2019 0618  Last data filed at 06/28/2019 1700  Gross per 24 hour   Intake 910.42 ml   Output ???   Net 910.42 ml       Most recent recorded  weights:  Last 5 Recorded Weights    06/26/19 1019 06/28/19 1527   Weight: (!) 105.2 kg (231 lb 14.8 oz) (!) 103.9 kg (229 lb)       Medications:   Scheduled Meds:  ??? atorvastatin  40 mg Oral Daily   ??? buPROPion  150 mg Oral BID   ??? citalopram  40 mg Oral Daily   ??? lidocaine  1 patch Transdermal Q24H   ??? pantoprazole  20 mg Oral Daily   ??? polyethylen glycol  17 g Oral BID   ??? senna  2 tablet Oral Nightly     Continuous Infusions:  ??? lactated Ringers 10 mL/hr (06/28/19 1531)   ??? sodium chloride Stopped (06/29/19 0981)

## 2019-06-30 LAB — COMPREHENSIVE METABOLIC PANEL
ALBUMIN: 3.3 g/dL — ABNORMAL LOW (ref 3.5–5.0)
ALKALINE PHOSPHATASE: 111 U/L (ref 38–126)
ANION GAP: 8 mmol/L (ref 7–15)
AST (SGOT): 23 U/L (ref 14–38)
BILIRUBIN TOTAL: 0.5 mg/dL (ref 0.0–1.2)
BLOOD UREA NITROGEN: 7 mg/dL (ref 7–21)
BUN / CREAT RATIO: 12
CALCIUM: 8.8 mg/dL (ref 8.5–10.2)
CHLORIDE: 97 mmol/L — ABNORMAL LOW (ref 98–107)
CO2: 31 mmol/L — ABNORMAL HIGH (ref 22.0–30.0)
CREATININE: 0.57 mg/dL — ABNORMAL LOW (ref 0.60–1.00)
EGFR CKD-EPI NON-AA FEMALE: >90 mL/min/{1.73_m2} (ref >=60)
GLUCOSE RANDOM: 116 mg/dL (ref 70–179)
POTASSIUM: 3.6 mmol/L (ref 3.5–5.0)
PROTEIN TOTAL: 6.4 g/dL — ABNORMAL LOW (ref 6.5–8.3)
SODIUM: 136 mmol/L (ref 135–145)

## 2019-06-30 LAB — ALBUMIN: Albumin:MCnc:Pt:Ser/Plas:Qn:: 3.3 — ABNORMAL LOW

## 2019-06-30 LAB — CBC
HEMATOCRIT: 36.2 % (ref 36.0–46.0)
HEMOGLOBIN: 11.7 g/dL — ABNORMAL LOW (ref 12.0–16.0)
MEAN CORPUSCULAR HEMOGLOBIN CONC: 32.3 g/dL (ref 31.0–37.0)
MEAN CORPUSCULAR VOLUME: 89.7 fL (ref 80.0–100.0)
MEAN PLATELET VOLUME: 7.7 fL (ref 7.0–10.0)
PLATELET COUNT: 245 10*9/L (ref 150–440)
RED BLOOD CELL COUNT: 4.03 10*12/L (ref 4.00–5.20)
RED CELL DISTRIBUTION WIDTH: 15.5 % — ABNORMAL HIGH (ref 12.0–15.0)
WBC ADJUSTED: 9.7 10*9/L (ref 4.5–11.0)

## 2019-06-30 LAB — RED BLOOD CELL COUNT: Lab: 4.03

## 2019-06-30 MED ORDER — METOPROLOL SUCCINATE ER 100 MG TABLET,EXTENDED RELEASE 24 HR
ORAL_TABLET | Freq: Every day | ORAL | 0 refills | 30 days
Start: 2019-06-30 — End: 2019-07-30

## 2019-06-30 MED ORDER — HYDROMORPHONE 2 MG TABLET
ORAL_TABLET | ORAL | 0 refills | 1 days | Status: CP | PRN
Start: 2019-06-30 — End: 2019-07-05
  Filled 2019-06-30: qty 5, 1d supply, fill #0

## 2019-06-30 MED FILL — HYDROMORPHONE 2 MG TABLET: 1 days supply | Qty: 5 | Fill #0 | Status: AC

## 2019-06-30 NOTE — Unmapped (Signed)
[facility]  [address] [facility]  [address]  [place] [address]  Dept: [phone]  Loc: [phone]       [facility] Hospitalist Discharge Summary    Marie Quinn  1955-10-21  [id number]    Primary Care Physician:  Olena Leatherwood, NP  Code Status: Full Code    Admit Date: 06/26/2019  Discharge Date: 06/30/2019     Discharge Physician: Ellin Mayhew  Discharge Disposition: Home    Consulting Physicians:   1.  GI Biliary    Discharge Diagnoses (All present on admission):   1.  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  Chronic diastolic CHF  4.  Hypertension  5.  Coronary artery disease with most recent PCI 2008  6.  Depression  7.  Tobacco abuse      Hospital Course:   63 year-old woman with CAD, chronic diastolic CHF, and history of ERCP-associated pancreatitis in January this year who has had recurrent pancreatitis and presented with abdominal pain similar to pain she experienced before with pancreatitis.  Her lipase was 1875 on admission.  CT showed pancreatitis and a known 2.9 cm fluid collection within the pancreas consistent with walled-off pancreatic necrosis.  She was treated supportively with IV fluids, bowel rest, and pain medications.  She was seen by GI Biliary who performed an ERCP on 9/14 that showed extravasation of contrast out of the pancreatic duct; a pancreatic duct stent was placed.  Following that, her abdominal pain improved significantly, and she was able to tolerate solid food.  She will follow up with GI Biliary in about 6 weeks for a repeat ERCP with likely stent exchange.  She will undergo her previously scheduled routine screening colonoscopy next week.      Pending Laboratory Studies: None      Outpatient Provider Issues to Follow Up: None      Discharge Medications:  START taking these medications    Details   HYDROmorphone (DILAUDID) 2 MG tablet Take 1 tablet (2 mg total) by mouth every four (4) hours as needed for severe (7-10) pain., Starting Wed 06/30/2019, Until Mon 07/05/2019, Normal         CONTINUE these medications which have CHANGED    Details   metoprolol succinate (TOPROL-XL) 100 MG 24 hr tablet Take 1 tablet (100 mg total) by mouth daily. Repeat for elevated BP., Starting Wed 06/30/2019, Until Fri 07/30/2019, No Print         CONTINUE these medications which have NOT CHANGED    Details   albuterol 2.5 mg /3 mL (0.083 %) nebulizer solution Inhale the contents of 1 vial (2.5 mg total) by nebulization every four (4) hours as needed for wheezing., Starting Tue 06/01/2019, Until Wed 05/31/2020, Normal      albuterol HFA 90 mcg/actuation inhaler Inhale 2 puffs every four (4) hours as needed for wheezing., Starting Tue 06/01/2019, Until Wed 05/31/2020, Normal      amLODIPine (NORVASC) 10 MG tablet Take 1 tablet (10 mg total) by mouth daily., Starting Thu 09/24/2018, Normal      aspirin (ECOTRIN) 81 MG tablet Take 81 mg by mouth daily., Until Discontinued, Historical Med      atorvastatin (LIPITOR) 40 MG tablet Take 1 tablet (40 mg total) by mouth daily., Starting Mon 03/22/2019, Normal      buPROPion (WELLBUTRIN SR) 150 MG 12 hr tablet Take 1 tablet (150 mg  total) by mouth Two (2) times a day., Starting Tue 06/01/2019, Normal      citalopram (CELEXA) 40 MG tablet Take 1 tablet (40 mg total) by mouth daily., Starting Fri 11/20/2018, Normal      !! empty container (SHARPS CONTAINER) Misc Use as directed., Starting Thu 04/15/2019, Normal      !! empty container Misc Use as directed to dispose of injectable medications, Starting Thu 04/15/2019, Normal      evolocumab (REPATHA SYRINGE) 140 mg/mL Syrg Inject the contents of 1 syringe (140 mg) under the skin every fourteen (14) days., Starting Tue 04/13/2019, Normal      hydrOXYzine (ATARAX) 25 MG tablet Take 1 tablet (25 mg total) by mouth every six (6) hours as needed for anxiety and congestion., Starting Tue 06/01/2019, Normal      losartan (COZAAR) 100 MG tablet Take 1 tablet (100 mg total) by mouth daily., Starting Thu 09/24/2018, Until Fri 09/24/2019, Normal      omeprazole (PRILOSEC) 20 MG capsule Take 1 capsule (20 mg total) by mouth two (2) times a day., Starting Mon 03/29/2019, Until Tue 03/28/2020, Normal      polyethylene glycol (GLYCOLAX) 17 gram/dose powder Mix 1 capful (17g) in 4-8 ounces of water and drink by mouth daily., Starting Thu 06/10/2019, Until Sat 07/10/2019, Normal      promethazine (PHENERGAN) 12.5 MG tablet Take 1 tablet (12.5 mg total) by mouth every six (6) hours as needed for nausea., Starting Thu 04/15/2019, Until Fri 04/14/2020, Normal       !! - Potential duplicate medications found. Please discuss with provider.      STOP taking these medications       ondansetron (ZOFRAN-ODT) 8 MG disintegrating tablet              Discharge Follow-Up:   Appointments which have been scheduled for you    Jul 07, 2019  9:30 AM  COVID PRE TEST with [facility] COVID PRE TEST [facility] PROVIDER  RESPIRATORY DIAGNOSTIC CENTER [facility] REGION) [address]  [place] [address]  [phone]      Jul 08, 2019  COLONOSCOPY, FLEXIBLE, PROXIMAL TO SPLENIC FLEXURE; DIAGNOSTIC, W/WO COLLECTION SPECIMEN BY BRUSH OR WASH with Maris Berger, MD  GI PERIOP [facility] REGION) [address]  [place] [address]  [phone]   None            Discharge Instructions:  1. Activity as tolerated.  2. Diet: Low fat    Other Instructions     Call MD for:      Sudden shortness of breath, severe chest pain, severe lightheadedness, or sudden weakness or numbness on one side of your body or face.         Discharge instructions      You were hospitalized for pancreatitis likely related to a leak in the pancreatic duct that was stented.  Please take it easy with the food you eat as you should stick with a low-fat diet.  You should receive a phone call with an appointment time for your repeat ERCP that will need to be done in about 6 weeks; if you do not hear from them please call them at [phone].                 Pertinent Laboratory Studies:   Recent Labs   Lab Units 06/30/19  0853 06/29/19  0102 06/28/19  7253 06/27/19  0556 06/26/19  2103  06/25/19  2310   WBC 10*9/L 9.7 5.9 7.3 9.6 10.8 12.3*   HEMOGLOBIN g/dL 91.4* 78.2* 95.6* 21.3* 11.8* 13.5   HEMATOCRIT % 36.2 33.6* 33.8* 34.1* 37.2 40.5   PLATELET COUNT (1) 10*9/L 245 223 216 210 243 264     Recent Labs   Lab Units 06/30/19  0853 06/29/19  0748 06/28/19  0657 06/27/19  0556 06/26/19  2103 06/25/19  2311   SODIUM mmol/L 136 137 138 136 135 144   POTASSIUM mmol/L 3.6 3.8 4.3 4.8 4.2 4.2   CHLORIDE mmol/L 97* 104 106 107 103 103   CO2 mmol/L 31.0* 28.0 25.0 25.0 25.0 30.0   BUN mg/dL 7 4* 9 16 20 16    CREATININE mg/dL 0.86* 5.78* 4.69* 6.29 0.79 0.74   GLUCOSE mg/dL 528 413 85 79 84 94   CALCIUM mg/dL 8.8 8.4* 8.4* 8.5 8.1* 9.3   ALBUMIN g/dL 3.3* 2.9* 3.1* 3.0* 3.2* 4.1   PROTEIN TOTAL g/dL 6.4* 5.8* 6.1* 5.9* 6.2* 7.6   BILIRUBIN TOTAL mg/dL 0.5 0.5 0.6 0.8 0.7 0.5   AST U/L 23 22 19 19 18 17    ALT U/L 18 15 14 13 14 16    ALK PHOS U/L 111 97 90 99 100 117   MAGNESIUM mg/dL  --   --  1.6 1.7 1.6  --    PHOSPHORUS mg/dL  --   --  3.3 3.3 4.1  --    LIPASE U/L  --   --   --   --   --  1,875*       Recent Labs   Lab Units 06/25/19  2305   WBC UA /HPF 5   NITRITE UA  Negative   LEUKOCYTES UA  Negative   BACTERIA UA /HPF Occasional*   RBC UA /HPF 2   BLOOD UA  Trace*   GLUCOSE UA  Negative   PROTEIN UA  Negative   KETONES UA  Negative       Pertinent Imaging Studies:   1.  CT abdomen pelvis without contrast 9/12 showed overall similar to slight worsening of pancreatitis compared to 05/05/2019, including degree of inflammatory stranding and 2.9 cm fluid collection within the pancreatic body now consistent with walled off necrosis.  Evaluation for progressive necrosis and/or new fluid collections limited secondary to lack of IV contrast. Prominent pancreatic duct adjacent to the fluid collection similar to prior and potentially secondary to mass effect by the collection.        Time spent on discharge less than 30 minutes.

## 2019-06-30 NOTE — Unmapped (Signed)
Patient reports she overall feels like she is doing well. Reports generalized soreness improved with tylenol. Was able to eat 100% of dinner without complications. Encouraged ambulation within room. Encouraged PO fluid intake. Will continue POC           Problem: Adult Inpatient Plan of Care  Goal: Plan of Care Review  Outcome: Progressing  Goal: Patient-Specific Goal (Individualization)  Outcome: Progressing  Goal: Absence of Hospital-Acquired Illness or Injury  Outcome: Progressing  Goal: Optimal Comfort and Wellbeing  Outcome: Progressing  Goal: Readiness for Transition of Care  Outcome: Progressing  Goal: Rounds/Family Conference  Outcome: Progressing     Problem: Arrhythmia/Dysrhythmia (Heart Failure)  Goal: Stable Heart Rate and Rhythm  Outcome: Progressing     Problem: Pain Acute  Goal: Optimal Pain Control  Outcome: Progressing     Problem: Hypertension Comorbidity  Goal: Blood Pressure in Desired Range  Outcome: Progressing

## 2019-07-02 NOTE — Unmapped (Signed)
Patient called after hours line.    Reports that all day today, she has been in significant pain all day today, but she has severe pain has worsened, reports has vomited twice now. She is able to keep down some liquids, and foods. She has taken three dilaudid tablets (didn't require any on day of discharge), without much relief reports pain is now progressive. She reports pain is different from prior episodes of pancreatitis, however, as this pain is diffuse (her whole abdomen), rather than more localized to epigastric pain.    Given progressive pain that is difficult to manage at home, I recommended she get evaluated at the emergency room. She verbalized understanding and was in agreement with plan.

## 2019-07-05 NOTE — Unmapped (Signed)
[facility] Clinical Assessment & Refill Coordination Note    Marie Quinn, [place]: 1956-03-27  Phone: [phone] (home)     All above HIPAA information was verified with patient.     Specialty Medication(s):   General Specialty: Repatha     Current Outpatient Medications   Medication Sig Dispense Refill   ??? albuterol 2.5 mg /3 mL (0.083 %) nebulizer solution Inhale the contents of 1 vial (2.5 mg total) by nebulization every four (4) hours as needed for wheezing. 25 vial 2   ??? albuterol HFA 90 mcg/actuation inhaler Inhale 2 puffs every four (4) hours as needed for wheezing. 18 g 1   ??? amLODIPine (NORVASC) 10 MG tablet Take 1 tablet (10 mg total) by mouth daily. 90 tablet 3   ??? aspirin (ECOTRIN) 81 MG tablet Take 81 mg by mouth daily.     ??? atorvastatin (LIPITOR) 40 MG tablet Take 1 tablet (40 mg total) by mouth daily. 90 tablet 3   ??? buPROPion (WELLBUTRIN SR) 150 MG 12 hr tablet Take 1 tablet (150 mg total) by mouth Two (2) times a day. 60 tablet 3   ??? citalopram (CELEXA) 40 MG tablet Take 1 tablet (40 mg total) by mouth daily. 90 tablet 3   ??? empty container (SHARPS CONTAINER) Misc Use as directed. 1 each 3   ??? empty container Misc Use as directed to dispose of injectable medications 1 each 3   ??? evolocumab (REPATHA SYRINGE) 140 mg/mL Syrg Inject the contents of 1 syringe (140 mg) under the skin every fourteen (14) days. 6 mL 3   ??? HYDROmorphone (DILAUDID) 2 MG tablet Take 1 tablet (2 mg total) by mouth every four (4) hours as needed for severe (7-10) pain. 5 tablet 0   ??? hydrOXYzine (ATARAX) 25 MG tablet Take 1 tablet (25 mg total) by mouth every six (6) hours as needed for anxiety and congestion. 90 tablet 3   ??? losartan (COZAAR) 100 MG tablet Take 1 tablet (100 mg total) by mouth daily. 90 tablet 3   ??? metoprolol succinate (TOPROL-XL) 100 MG 24 hr tablet Take 1 tablet (100 mg total) by mouth daily. Repeat for elevated BP. 30 tablet 0   ??? omeprazole (PRILOSEC) 20 MG capsule Take 1 capsule (20 mg total) by mouth two (2) times a day. 180 capsule 1   ??? polyethylene glycol (GLYCOLAX) 17 gram/dose powder Mix 1 capful (17g) in 4-8 ounces of water and drink by mouth daily. 238 g 3   ??? promethazine (PHENERGAN) 12.5 MG tablet Take 1 tablet (12.5 mg total) by mouth every six (6) hours as needed for nausea. 20 tablet 0     No current facility-administered medications for this visit.         Changes to medications: Gudelia Reports stopping the following medications: water pill    Allergies   Allergen Reactions   ??? Iodine And Iodide Containing Products Anaphylaxis   ??? Statins-Hmg-Coa Reductase Inhibitors      Side effects   ??? Sulfa (Sulfonamide Antibiotics)    ??? Oxycodone Nausea Only     Makes her feel weird       Changes to allergies: No    SPECIALTY MEDICATION ADHERENCE     Repatha 140 mg/ml: 14 days of medicine on hand            Specialty medication(s) dose(s) confirmed: Regimen is correct and unchanged.     Are there any concerns with adherence? No  Adherence counseling provided? Not needed    CLINICAL MANAGEMENT AND INTERVENTION      Clinical Benefit Assessment:    Do you feel the medicine is effective or helping your condition? Unable to determine at this time - blood work has not been done since starting.    Clinical Benefit counseling provided? Not needed    Adverse Effects Assessment:    Are you experiencing any side effects? No    Are you experiencing difficulty administering your medicine? No    Quality of Life Assessment:    How many days over the past month did your hyperlipidemia  keep you from your normal activities? For example, brushing your teeth or getting up in the morning. 0    Have you discussed this with your provider? Not needed    Therapy Appropriateness:    Is therapy appropriate? Yes, therapy is appropriate and should be continued    DISEASE/MEDICATION-SPECIFIC INFORMATION      For patients on injectable medications: Patient currently has 1 doses left.  Next injection is scheduled for 07/05/19.    PATIENT SPECIFIC NEEDS     ? Does the patient have any physical, cognitive, or cultural barriers? No    ? Is the patient high risk? No     ? Does the patient require a Care Management Plan? No     ? Does the patient require physician intervention or other additional services (i.e. nutrition, smoking cessation, social work)? No      SHIPPING     Specialty Medication(s) to be Shipped:   General Specialty: Repatha    Other medication(s) to be shipped: none     Changes to insurance: No    Delivery Scheduled: Yes, Expected medication delivery date: 07/13/19.     Medication will be delivered via Same Day Courier to the confirmed home address in [facility].    The patient will receive a drug information handout for each medication shipped and additional FDA Medication Guides as required.  Verified that patient has previously received a Conservation officer, historic buildings.    All of the patient's questions and concerns have been addressed.    Arnold Long   [facility] Specialty Pharmacist

## 2019-07-13 MED FILL — REPATHA SYRINGE 140 MG/ML SUBCUTANEOUS SYRINGE: SUBCUTANEOUS | 84 days supply | Qty: 6 | Fill #1

## 2019-07-13 MED FILL — REPATHA SYRINGE 140 MG/ML SUBCUTANEOUS SYRINGE: 84 days supply | Qty: 6 | Fill #1 | Status: AC

## 2019-07-14 MED FILL — OMEPRAZOLE 20 MG CAPSULE,DELAYED RELEASE: ORAL | 90 days supply | Qty: 180 | Fill #1

## 2019-07-14 MED FILL — ATORVASTATIN 40 MG TABLET: ORAL | 90 days supply | Qty: 90 | Fill #1

## 2019-07-14 MED FILL — AMLODIPINE 10 MG TABLET: 90 days supply | Qty: 90 | Fill #1 | Status: AC

## 2019-07-14 MED FILL — CITALOPRAM 40 MG TABLET: 90 days supply | Qty: 90 | Fill #1 | Status: AC

## 2019-07-14 MED FILL — BUPROPION HCL SR 150 MG TABLET,12 HR SUSTAINED-RELEASE: 30 days supply | Qty: 60 | Fill #0 | Status: AC

## 2019-07-14 MED FILL — ATORVASTATIN 40 MG TABLET: 90 days supply | Qty: 90 | Fill #1 | Status: AC

## 2019-07-14 MED FILL — CITALOPRAM 40 MG TABLET: ORAL | 90 days supply | Qty: 90 | Fill #1

## 2019-07-14 MED FILL — OMEPRAZOLE 20 MG CAPSULE,DELAYED RELEASE: 90 days supply | Qty: 180 | Fill #1 | Status: AC

## 2019-07-14 MED FILL — AMLODIPINE 10 MG TABLET: ORAL | 90 days supply | Qty: 90 | Fill #1

## 2019-07-14 MED FILL — HYDROXYZINE HCL 25 MG TABLET: ORAL | 23 days supply | Qty: 90 | Fill #1

## 2019-07-14 MED FILL — LOSARTAN 100 MG TABLET: 90 days supply | Qty: 90 | Fill #1 | Status: AC

## 2019-07-14 MED FILL — HYDROXYZINE HCL 25 MG TABLET: 23 days supply | Qty: 90 | Fill #1 | Status: AC

## 2019-07-14 MED FILL — LOSARTAN 100 MG TABLET: ORAL | 90 days supply | Qty: 90 | Fill #1

## 2019-07-20 MED ORDER — PEG 3350-ELECTROLYTES 236 GRAM-22.74 GRAM-6.74 GRAM-5.86 GRAM SOLUTION
ORAL | 0 refills | 0 days
Start: 2019-07-20 — End: ?

## 2019-07-20 MED ORDER — PEG-ELECTROLYTE SOLUTION 420 GRAM ORAL SOLUTION
0 refills | 0.00000 days | Status: CP
Start: 2019-07-20 — End: 2019-07-20

## 2019-07-20 MED ORDER — PEG-ELECTROLYTE SOLUTION 420 GRAM ORAL SOLUTION: Bottle | 0 refills | 0 days | Status: AC

## 2019-07-20 NOTE — Unmapped (Signed)
Received message stating the patient didn't have prep instructions  Call to patient   The instructions were sent to her via mychart on 06/25/2019     Patient states she read that but she doesn't have the bowel prep medication   She didn't go to her covid pre test yesterday.  She wants to reschedule the colonoscopy.  Done  Resent instructions via mychart  Called in bowel prep Rx to shared services.  Rescheduled covid pre test as well

## 2019-08-03 MED FILL — PEG-ELECTROLYTE SOLUTION 420 GRAM ORAL SOLUTION: 1 days supply | Qty: 2 | Fill #0 | Status: AC

## 2019-08-03 MED FILL — PEG-ELECTROLYTE SOLUTION 420 GRAM ORAL SOLUTION: 1 days supply | Qty: 2 | Fill #0

## 2019-08-04 ENCOUNTER — Ambulatory Visit: Admit: 2019-08-04 | Discharge: 2019-08-05 | Payer: MEDICARE | Attending: Family | Primary: Family

## 2019-08-04 NOTE — Unmapped (Signed)
COVID Pre-Procedure Intake Form     Assessment     Marie Quinn is a 63 y.o. female presenting to [facility] for COVID testing.     Plan     If no testing performed, pt counseled on routine care for respiratory illness.  If testing performed, COVID sent.  Patient directed to Home given findings during today's visit.    Subjective     Marie Quinn is a 63 y.o. female who [facility] the [facility] with complaints of the following:    Exposure History: In the last 21 days?     Have you traveled outside of [place]? No               Have you been in close contact with someone confirmed by a test to have COVID? (Close contact is within 6 feet for at least 10 minutes) No       Have you worked in a health care facility? No     Lived or worked facility like a nursing home, group home, or assisted living?    No         Are you scheduled to have surgery or a procedure in the next 3 days? Yes               Are you scheduled to receive cancer chemotherapy within the next 7 days?    No     Have you ever been tested before for COVID-19 with a swab of your nose? Yes: When: 2 WEEKS AGO, Where: [place]   Are you a healthcare worker being tested so to return to work No     Right now,  do you have any of the following that developed over the past 7 days (as stated by patient on intake form):    Subjective fever (felt feverish) No   Chills (especially repeated shaking chills) No   Severe fatigue (felt very tired) Yes, how many days? 7   Muscle aches Yes, how many days? 7   Runny nose No   Sore throat No   Loss of taste or smell No   Cough (new onset or worsening of chronic cough) No   Shortness of breath No   Nausea or vomiting No   Headache No   Abdominal Pain No   Diarrhea (3 or more loose stools in last 24 hours) No       Scribe's Attestation: Paulita Fujita, NP obtained and performed the history, physical exam and medical decision making elements that were entered into the chart.  Signed by Gaspar Skeeters serving as Scribe, on 08/04/2019 11:26 AM      The documentation recorded by the scribe accurately reflects the service I personally performed and the decisions made by me. Aida Puffer, FNP  August 04, 2019 1:53 PM

## 2019-08-06 ENCOUNTER — Encounter: Admit: 2019-08-06 | Discharge: 2019-08-06 | Payer: MEDICARE

## 2019-08-06 ENCOUNTER — Ambulatory Visit: Admit: 2019-08-06 | Discharge: 2019-08-06 | Payer: MEDICARE

## 2019-08-09 ENCOUNTER — Ambulatory Visit: Admit: 2019-08-09 | Discharge: 2019-08-09 | Payer: MEDICARE

## 2019-08-09 ENCOUNTER — Encounter
Admit: 2019-08-09 | Discharge: 2019-08-09 | Payer: MEDICARE | Attending: Certified Registered" | Primary: Certified Registered"

## 2019-08-27 MED FILL — BUPROPION HCL SR 150 MG TABLET,12 HR SUSTAINED-RELEASE: ORAL | 30 days supply | Qty: 60 | Fill #1

## 2019-08-27 MED FILL — ALBUTEROL SULFATE HFA 90 MCG/ACTUATION AEROSOL INHALER: RESPIRATORY_TRACT | 17 days supply | Qty: 18 | Fill #1

## 2019-09-27 DIAGNOSIS — I10 Essential (primary) hypertension: Principal | ICD-10-CM

## 2019-09-27 DIAGNOSIS — R1013 Epigastric pain: Principal | ICD-10-CM

## 2019-09-27 MED ORDER — LOSARTAN 100 MG TABLET
ORAL_TABLET | Freq: Every day | ORAL | 0 refills | 90.00000 days | Status: CP
Start: 2019-09-27 — End: 2020-09-26
  Filled 2019-10-11: qty 90, 90d supply, fill #0

## 2019-09-27 MED ORDER — AMLODIPINE 10 MG TABLET
ORAL_TABLET | Freq: Every day | ORAL | 0 refills | 90 days | Status: CP
Start: 2019-09-27 — End: ?
  Filled 2019-10-11: qty 90, 90d supply, fill #0

## 2019-09-27 MED ORDER — OMEPRAZOLE 20 MG CAPSULE,DELAYED RELEASE
ORAL_CAPSULE | Freq: Two times a day (BID) | ORAL | 1 refills | 90.00000 days | Status: CP
Start: 2019-09-27 — End: 2020-09-26
  Filled 2019-10-11: qty 180, 90d supply, fill #0

## 2019-09-27 NOTE — Unmapped (Signed)
[facility] Refill Coordination Note    Specialty Medication(s) to be Shipped:   General Specialty: Repatha    Other medication(s) to be shipped:  amlodipine,atorvastatin,bupropion,citalopram,hydroxyzine,omeprazole,losartan     Marie Quinn, DOB: 11-10-1955  Phone: [phone] (home)       All above HIPAA information was verified with patient.     Was a Nurse, learning disability used for this call? No    Completed refill call assessment today to schedule patient's medication shipment from the [facility] [phone]).       Specialty medication(s) and dose(s) confirmed: Regimen is correct and unchanged.   Changes to medications: [name] reports no changes at this time.  Changes to insurance: No  Questions for the pharmacist: No    Confirmed patient received Welcome Packet with first shipment. The patient will receive a drug information handout for each medication shipped and additional FDA Medication Guides as required.       DISEASE/MEDICATION-SPECIFIC INFORMATION        N/A    SPECIALTY MEDICATION ADHERENCE     Medication Adherence    Patient reported X missed doses in the last month: 0  Specialty Medication: repatha 140mg /ml  Patient is on additional specialty medications: No  Informant: patient                repatha 140 mg: 14 days of medicine on hand         SHIPPING     Shipping address confirmed in Epic.     Delivery Scheduled: Yes, Expected medication delivery date: 12/29.     Medication will be delivered via UPS to the prescription address in Epic W[facility].    Antonietta Barcelona   [facility] Specialty Technician

## 2019-10-11 MED FILL — REPATHA SYRINGE 140 MG/ML SUBCUTANEOUS SYRINGE: 84 days supply | Qty: 6 | Fill #2 | Status: AC

## 2019-10-11 MED FILL — HYDROXYZINE HCL 25 MG TABLET: 23 days supply | Qty: 90 | Fill #2 | Status: AC

## 2019-10-11 MED FILL — AMLODIPINE 10 MG TABLET: 90 days supply | Qty: 90 | Fill #0 | Status: AC

## 2019-10-11 MED FILL — REPATHA SYRINGE 140 MG/ML SUBCUTANEOUS SYRINGE: SUBCUTANEOUS | 84 days supply | Qty: 6 | Fill #2

## 2019-10-11 MED FILL — OMEPRAZOLE 20 MG CAPSULE,DELAYED RELEASE: 90 days supply | Qty: 180 | Fill #0 | Status: AC

## 2019-10-11 MED FILL — CITALOPRAM 40 MG TABLET: ORAL | 90 days supply | Qty: 90 | Fill #2

## 2019-10-11 MED FILL — HYDROXYZINE HCL 25 MG TABLET: ORAL | 23 days supply | Qty: 90 | Fill #2

## 2019-10-11 MED FILL — BUPROPION HCL SR 150 MG TABLET,12 HR SUSTAINED-RELEASE: 30 days supply | Qty: 60 | Fill #2 | Status: AC

## 2019-10-11 MED FILL — LOSARTAN 100 MG TABLET: 90 days supply | Qty: 90 | Fill #0 | Status: AC

## 2019-10-11 MED FILL — BUPROPION HCL SR 150 MG TABLET,12 HR SUSTAINED-RELEASE: ORAL | 30 days supply | Qty: 60 | Fill #2

## 2019-10-11 MED FILL — CITALOPRAM 40 MG TABLET: 90 days supply | Qty: 90 | Fill #2 | Status: AC

## 2019-10-11 MED FILL — ATORVASTATIN 40 MG TABLET: ORAL | 90 days supply | Qty: 90 | Fill #2

## 2019-10-11 MED FILL — ATORVASTATIN 40 MG TABLET: 90 days supply | Qty: 90 | Fill #2 | Status: AC

## 2019-10-13 DIAGNOSIS — I10 Essential (primary) hypertension: Principal | ICD-10-CM

## 2019-10-13 MED ORDER — METOPROLOL SUCCINATE ER 100 MG TABLET,EXTENDED RELEASE 24 HR
ORAL_TABLET | Freq: Two times a day (BID) | ORAL | 3 refills | 90 days | Status: CP
Start: 2019-10-13 — End: ?
  Filled 2019-10-18: qty 180, 90d supply, fill #0

## 2019-10-18 MED FILL — METOPROLOL SUCCINATE ER 100 MG TABLET,EXTENDED RELEASE 24 HR: 90 days supply | Qty: 180 | Fill #0 | Status: AC

## 2019-10-28 NOTE — Unmapped (Deleted)
I spent 17 minutes on the audio/video with the patient. I spent an additional 20 minutes on pre- and post-visit activities.     The patient was physically located in [place] or a state in which I am permitted to provide care. The patient and/or parent/gauardian understood that s/he may incur co-pays and cost sharing, and agreed to the telemedicine visit. The visit was completed via phone and/or video, which was appropriate and reasonable under the circumstances given the patient's presentation at the time.    The patient and/or parent/guardian has been advised of the potential risks and limitations of this mode of treatment (including, but not limited to, the absence of in-person examination) and has agreed to be treated using telemedicine. The patient's/patient's family's questions regarding telemedicine have been answered.     If the phone/video visit was completed in an ambulatory setting, the patient and/or parent/guardian has also been advised to contact their provider???s office for worsening conditions, and seek emergency medical treatment and/or call 911 if the patient deems either necessary.    Assessment/Plan   1. Coronary artery disease involving native coronary artery of native heart without angina pectoris  With hx of NSTEMI and PCI to distal LAD (2008) at [facility]. Recent nuclear stress test negative for ischemia and echo overal normal. She is having no exertional symptoms, has started exercising  - continue on ASA, metoprolol succinate 100 mg bid, atorvastatin 40 mg    2. Essential hypertension  On losartan 100 mg, Toprol 100 bid, amlodipine 10 mg daily. Chlorthalidone 25 mg recently started. BMP stable.    Home BP readings are now well controlled, <130/80    3. Tobacco abuse Currently smokes 0.75 ppd. Has quit in the past but restarts due to stress. I counseled her on the importance of quitting. Recently unable to enroll for [facility] due to unclear reasons but is willing to try again. Will have them contact her.     4. Sleep apnea, unspecified type  Patient with suspected sleep apnea. She was unable to undergo a sleep study due to COVID-19 restrictions. She will call and reschedule.      5. HLD  Now back on atorva 40, tolerating w/ some mild myalgias. LDL 183>>114  Will pursue [id number] (Repatha) given that ezetimibe therapy is unlikely to reduce LDL <70 - avg reduction 17%.  She will get lipids rechecked after 2-3 doses.     Lab Results   Component Value Date    LDL 114.9 (H) 04/09/2019       Follow up: No follow-ups on file.    Subjective:   YNW:GNFAOZ D Nile Dear, NP  Chief complaint:  64 y.o. female with history of CAD, tobacco abuse, hypertension, hyperlipidemia, pancreatitis, anxiety/depression, and panic disorder who presents for phone follow up visit due to ongoing covid pandemic      History of Present Illness:  Patient has reported history of MI in 2008 or 2009 at [facility] but did not follow up with cardiology for some time. Established care with Dr. Julio Alm in October 2019 complaining of daily chest pain and anxiety. She underwent nuclear stress test that was negative for ischemia and echo showed overall normal LV systolic function with no significant structural disease. Previously did not tolerate simvastatin and started on atorvastatin with mild myalgias which she has seemed to tolerate. We have been working to optimize HTN control. She was also referred for sleep study, but is not yet scheduled. Was last seen 4 wks ago  for telehealth at which time chlorthalidone was added.      Reports home BP readings are typically 120/60 while on chlorthalidone. Recent BMP stable. Today, she reports that she is doing well. Reports her swelling has improved significantly. However, reports feeling significantly fatigued recently. Denies chest pain, chest discomfort, or SOB symptoms. She continues to dance and walks for exercise, feels she is more active. No reported issues with activity. She has tried calling the sleep clinic today, but was unable to connect with them and plans to call again.     Reports she continues on her atorvastatin, but continues to experience ongoing myalgias, although tolerable. Statin hate factor 7/10. Upon discussion of adding on repatha or Zetia for improved cholesterol control, we made the shared decision to pursue repatha given that Zetia would likely not reduce her cholesterol enough.    Explains she experiences high levels of stress and anxiety due to her cardiac conditions. Reports she used to meet with a therapist but has been unable to do so recently due to COVID-19. She has downloaded a mindfulness app which she found to be somewhat helpful, but has not used it regularly.     Continues to smoke 3/4 ppd. She is still interested in quitting smoking and is open to having the Tobacco Treatment Program call her.     Past Medical History  Patient Active Problem List   Diagnosis   ??? 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 Hypertension   ??? Myocardial infarction (CMS-HCC)   ??? Mild intermittent asthma without complication   ??? Anxiety   ??? Depression   ??? Post traumatic stress disorder (PTSD)   ??? Fibromyalgia   ??? Panic disorder   ??? Chest pain   ??? Urge incontinence   ??? Back spasm   ??? Dyslipidemia   ??? Epigastric pain   ??? Nausea & vomiting   ??? Elevated liver enzymes   ??? Acute pancreatitis with infected necrosis   ??? Tobacco abuse   ??? Chronic diastolic CHF (congestive heart failure) (CMS-HCC)   ??? Recurrent acute pancreatitis       Medications:  Current Outpatient Medications   Medication Sig Dispense Refill ??? albuterol 2.5 mg /3 mL (0.083 %) nebulizer solution Inhale the contents of 1 vial (2.5 mg total) by nebulization every four (4) hours as needed for wheezing. 25 vial 2   ??? albuterol HFA 90 mcg/actuation inhaler Inhale 2 puffs every four (4) hours as needed for wheezing. 18 g 1   ??? amLODIPine (NORVASC) 10 MG tablet Take 1 tablet (10 mg total) by mouth daily. 90 tablet 0   ??? aspirin (ECOTRIN) 81 MG tablet Take 81 mg by mouth daily.     ??? atorvastatin (LIPITOR) 40 MG tablet Take 1 tablet (40 mg total) by mouth daily. 90 tablet 3   ??? buPROPion (WELLBUTRIN SR) 150 MG 12 hr tablet Take 1 tablet (150 mg total) by mouth Two (2) times a day. 60 tablet 3   ??? citalopram (CELEXA) 40 MG tablet Take 1 tablet (40 mg total) by mouth daily. 90 tablet 3   ??? empty container (SHARPS CONTAINER) Misc Use as directed. 1 each 3   ??? empty container Misc Use as directed to dispose of injectable medications 1 each 3   ??? evolocumab (REPATHA SYRINGE) 140 mg/mL Syrg Inject the contents of 1 syringe (140 mg) under the skin every fourteen (14) days. 6 mL 3   ???  hydrOXYzine (ATARAX) 25 MG tablet Take 1 tablet (25 mg total) by mouth every six (6) hours as needed for anxiety and congestion. 90 tablet 3   ??? losartan (COZAAR) 100 MG tablet Take 1 tablet (100 mg total) by mouth daily. 90 tablet 0   ??? metoprolol succinate (TOPROL-XL) 100 MG 24 hr tablet Take 1 tablet (100 mg total) by mouth daily. Repeat for elevated BP. 30 tablet 0   ??? metoprolol succinate (TOPROL-XL) 100 MG 24 hr tablet Take 1 tablet (100 mg total) by mouth two (2) times a day. Repeat for elevated BP. 180 tablet 3   ??? omeprazole (PRILOSEC) 20 MG capsule Take 1 capsule (20 mg total) by mouth two (2) times a day. 180 capsule 1   ??? peg-electrolyte soln (GOLYTELY) 420 gram SolR Take as directed in the instructions sent to you via your [facility] 2 Bottle 0   ??? promethazine (PHENERGAN) 12.5 MG tablet Take 1 tablet (12.5 mg total) by mouth every six (6) hours as needed for nausea. 20 tablet 0 No current facility-administered medications for this visit.        Allergies  Allergies   Allergen Reactions   ??? Iodine And Iodide Containing Products Anaphylaxis   ??? Statins-Hmg-Coa Reductase Inhibitors      Side effects   ??? Sulfa (Sulfonamide Antibiotics)    ??? Oxycodone Nausea Only     Makes her feel weird       Social History:   Social History     Tobacco Use   ??? Smoking status: Former Smoker     Years: 25.00     Types: Cigarettes     Quit date: 05/02/2019     Years since quitting: 0.4   ??? Smokeless tobacco: Never Used   ??? Tobacco comment: Pt smokes  1.5cpd. Pt expressed desire to quit    Substance Use Topics   ??? Alcohol use: Not Currently     Frequency: Monthly or less   ??? Drug use: Not Currently     Types: Cocaine     Comment: not used cocaine in 13 years       Family History:  Family History   Problem Relation Age of Onset   ??? Heart disease Father         Deacesed   ??? Coronary artery disease Father         quadruple bypass   ??? Mental illness Father    ??? Asthma Father    ??? COPD Father    ??? Depression Father    ??? Hypertension Father    ??? Cancer Sister    ??? Arthritis Daughter    ??? Depression Daughter    ??? Asthma Sister    ??? Cancer Sister         Colan cancer deceased   ??? COPD Sister    ??? Depression Sister    ??? Early death Sister         68 yrs old   ??? Cancer Paternal Aunt         Thyroid cancer   ??? Cancer Maternal Grandmother         Brain cancer   ??? Depression Mother    ??? Diabetes Mother    ??? Hypertension Mother    ??? Diabetes Brother    ??? Vision loss Maternal Aunt         Blind at birth   ??? Substance Abuse Disorder Neg Hx    ??? Alcohol abuse Neg  Hx    ??? Drug abuse Neg Hx        ROS- 12 system review is negative other than what is specified in the History of Present Illness.      Objective:   Physical Exam  No physical exam due to phone visit     Laboratory data:    Electrocardiogram:  From July 2020 showed NSR and voltage criteria for LVH with nonspecific ST abnormality and prolonged QT.     Echocardiogram: From November 2019 was a technically difficult study. Showed normal LV function with EF 60-65% and mild LVH. Showed mild LA dilatation and mildly elevated RA pressure. Also showed grade 2 diastolic dysfunction, mild aortic regurgitation, mild TR, and mildly elevated PA pressure.    Nuclear stress test:  From December 2019 was a normal study. Showed no evidence for significant ischemia or scar. Post stress showed normal global systolic function with EF 63%.    Cardiac cath 08/04/2007 [facility])  Left main: Normal  LAD: Large caliber vessel with 90% distal stenosis.  Treated with 2.5X 24 Endeavor drug-eluting stent.  LCx: Large caliber nondominant dominant vessel with mild angiographic disease  Left ventriculogram normal 65% with apical hypokinesis, LVEDP 21 mmHg.  No evidence of aortic stenosis or mitral regurgitation.    Lipid panel:  Component      Latest Ref Rng & Units 04/09/2019   LDL Direct      60.0 - 99.0 mg/dL 454.0 (H)       This note was entered by Renato Shin acting as scribe for [employer], AGNP-C.  Signature: CAHL  Date: 10/27/19  Time: 9:52 PM    I have reviewed the documentation provided by the scribe and confirm that it accurately reflects the service I personally performed and the decisions made by me.  Signature: ***  Date: 10/27/19  Time: 9:52 PM

## 2019-11-10 ENCOUNTER — Ambulatory Visit: Admit: 2019-11-10 | Discharge: 2019-11-11 | Payer: MEDICARE | Attending: Family | Primary: Family

## 2019-11-10 DIAGNOSIS — F419 Anxiety disorder, unspecified: Principal | ICD-10-CM

## 2019-11-10 DIAGNOSIS — F329 Major depressive disorder, single episode, unspecified: Principal | ICD-10-CM

## 2019-11-10 DIAGNOSIS — R5383 Other fatigue: Principal | ICD-10-CM

## 2019-11-10 DIAGNOSIS — G4730 Sleep apnea, unspecified: Principal | ICD-10-CM

## 2019-11-10 DIAGNOSIS — F17200 Nicotine dependence, unspecified, uncomplicated: Principal | ICD-10-CM

## 2019-11-10 DIAGNOSIS — F4310 Post-traumatic stress disorder, unspecified: Principal | ICD-10-CM

## 2019-11-10 DIAGNOSIS — I10 Essential (primary) hypertension: Principal | ICD-10-CM

## 2019-11-10 MED ORDER — LOSARTAN 100 MG TABLET
ORAL_TABLET | Freq: Every day | ORAL | 0 refills | 90.00000 days | Status: CP
Start: 2019-11-10 — End: 2020-11-09

## 2019-11-10 MED ORDER — CITALOPRAM 40 MG TABLET
ORAL_TABLET | Freq: Every day | ORAL | 3 refills | 90.00000 days | Status: CP
Start: 2019-11-10 — End: ?

## 2019-11-10 MED ORDER — BUPROPION HCL SR 150 MG TABLET,12 HR SUSTAINED-RELEASE
ORAL_TABLET | Freq: Two times a day (BID) | ORAL | 3 refills | 30 days | Status: CP
Start: 2019-11-10 — End: ?

## 2019-11-10 MED ORDER — AMLODIPINE 10 MG TABLET
ORAL_TABLET | Freq: Every day | ORAL | 0 refills | 90 days | Status: CP
Start: 2019-11-10 — End: ?

## 2019-11-10 MED ORDER — HYDROXYZINE HCL 25 MG TABLET
ORAL_TABLET | Freq: Four times a day (QID) | ORAL | 3 refills | 23 days | Status: CP | PRN
Start: 2019-11-10 — End: ?

## 2019-11-10 NOTE — Unmapped (Addendum)
[facility] Primary Care at [facility] Note:  Marie Quinn, [place] [address]. Phone [phone]    11/10/2019    Patient Name:   Marie Quinn    MRN: [id number]    Demographics:    Age-  64 y.o.     Date of Birth-  04-17-1956    Chief complaint (CC):    Chief Complaint   Patient presents with   ??? F/U on Medical Management       Assessment/Plan:    Pleasant 64 y.o. female with PMH of HTN, CHF, pancreatitis, anxiety, depression, PTSD, bilateral LE pain, asthma, Pneumonia  RF Amlodipine 10 mg qd, losartan 100 mg every day, Wellbutrin 150 mg bid, Citalopram 40 mg qd, hydroxyzine 25 mg qd.     I advised her Smoking cessation will help reduce risk of infection and pain.    60 minutes of total time, > 1/2 the office visit on face to face counseling and coordination of care [facility] records reviewed, referrals to psych and Neuro). Discussed medical, dietary, lifestyle, and health maintenance modifications to optimize health. Due to COVID-19, masks worn and standard precautions followed during visit. Medication adherence and barriers to the treatment plan have been addressed.  Patient voiced understanding, needs F/U visit as scheduled.     Diagnosis ICD-10-CM Plan/Associated Orders   1. Other fatigue  R53.83 Polysomnography (Standard)   2. Sleep apnea, unspecified type   G47.30 Polysomnography (Standard)   3. Essential hypertension  I10 losartan (COZAAR) 100 MG tablet po every day  Amlodipine 10 mg qd   4. Anxiety  F41.9 hydrOXYzine (ATARAX) 25 MG tablet tid/qid  prn     citalopram (CELEXA) 40 MG tablet po qd     Ambulatory referral to Psychiatry   5. Depression, unspecified depression type  F32.9 citalopram (CELEXA) 40 MG tablet     buPROPion (WELLBUTRIN SR) 150 MG 12 hr tablet  1 po bid     Ambulatory referral to Psychiatry   6. Tobacco dependence  F17.200 buPROPion (WELLBUTRIN SR) 150 MG 12 hr tablet   7. Post traumatic stress disorder (PTSD)  F43.10 citalopram (CELEXA) 40 MG tablet     buPROPion (WELLBUTRIN SR) 150 MG 12 hr tablet     Ambulatory referral to Psychiatry       Health Maintenance:   Health Maintenance Due   Topic Date Due   ??? Pap Smear (21-65)  02/25/1979   ??? Zoster Vaccines (1 of 2) 02/25/2008   ??? Influenza Vaccine (1) 06/15/2019     Reviewed:  Immunizations: TDaP (UTD), Influenza (UTD), Pneumovax (UTD) Shingles - Due to COVID-19.  Mammograms - pending  Pap Smear - Due - deferred  ECG  Colonoscopy - Due    Subjective:    History of present illness (HPI):       Marie Quinn is a 64 y.o. female who presents for F/U on multiple medical problems, needs medication refills. Reports sleep apnea and fatigue, I have been told I have this issue. Denies any alcohol use. Medications used in the past were reviewed. Patient has required emergency room treatment for these symptoms, and has required hospitalization.     Patient is still smoking - 1/3 ppd. She states anytime when I get stressed or upset, I reach for a cigarette. She expresses she knows she needs to quit. Past treatment includes: Chantix (pt had MI while taking, does not want to resume), Wellbutrin (pt reports effective).     Denies HA, Fever, chest pain, bladder issues, or  swelling. (+) fatigue and anxiety. New spot on head. Depression.    Relevant ROS: Reviewed 12 systems, positive findings listed, all others negative.    Pertinent Past Med Hx:    Past Medical History:   Diagnosis Date   ??? Anxiety    ??? Arthritis     Adult life   ??? Asthma    ??? BPPV (benign paroxysmal positional vertigo)    ??? Chronic diastolic CHF (congestive heart failure) (CMS-HCC)    ??? COPD (chronic obstructive pulmonary disease) (CMS-HCC)    ??? Depression    ??? Fibromyalgia    ??? GERD (gastroesophageal reflux disease)     Adult life   ??? Heart disease    ??? Heart murmur    ??? Hyperlipidemia    ??? Hypertension    ??? Myocardial infarction (CMS-HCC) 2008    s/p stent placement x 1 at [facility]   ??? Obesity    ??? Panic disorder    ??? Substance abuse (CMS-HCC)    ??? Urge incontinence        Medications: Current Outpatient Medications:   ???  albuterol 2.5 mg /3 mL (0.083 %) nebulizer solution, Inhale the contents of 1 vial (2.5 mg total) by nebulization every four (4) hours as needed for wheezing., Disp: 25 vial, Rfl: 2  ???  albuterol HFA 90 mcg/actuation inhaler, Inhale 2 puffs every four (4) hours as needed for wheezing., Disp: 18 g, Rfl: 1  ???  amLODIPine (NORVASC) 10 MG tablet, Take 1 tablet (10 mg total) by mouth daily., Disp: 90 tablet, Rfl: 0  ???  aspirin (ECOTRIN) 81 MG tablet, Take 81 mg by mouth daily., Disp: , Rfl:   ???  buPROPion (WELLBUTRIN SR) 150 MG 12 hr tablet, Take 1 tablet (150 mg total) by mouth Two (2) times a day., Disp: 60 tablet, Rfl: 3  ???  citalopram (CELEXA) 40 MG tablet, Take 1 tablet (40 mg total) by mouth daily., Disp: 90 tablet, Rfl: 3  ???  empty container (SHARPS CONTAINER) Misc, Use as directed., Disp: 1 each, Rfl: 3  ???  empty container Misc, Use as directed to dispose of injectable medications, Disp: 1 each, Rfl: 3  ???  evolocumab (REPATHA SYRINGE) 140 mg/mL Syrg, Inject the contents of 1 syringe (140 mg) under the skin every fourteen (14) days., Disp: 6 mL, Rfl: 3  ???  gabapentin (NEURONTIN) 100 MG capsule, , Disp: , Rfl:   ???  hydrOXYzine (ATARAX) 25 MG tablet, Take 1 tablet (25 mg total) by mouth every six (6) hours as needed for anxiety and congestion., Disp: 90 tablet, Rfl: 3  ???  losartan (COZAAR) 100 MG tablet, Take 1 tablet (100 mg total) by mouth daily., Disp: 90 tablet, Rfl: 0  ???  metoprolol succinate (TOPROL-XL) 100 MG 24 hr tablet, Take 1 tablet (100 mg total) by mouth two (2) times a day. Repeat for elevated BP., Disp: 180 tablet, Rfl: 3  ???  omeprazole (PRILOSEC) 20 MG capsule, Take 1 capsule (20 mg total) by mouth two (2) times a day., Disp: 180 capsule, Rfl: 1  ???  ondansetron (ZOFRAN-ODT) 8 MG disintegrating tablet, , Disp: , Rfl:   ???  peg-electrolyte soln (GOLYTELY) 420 gram SolR, Take as directed in the instructions sent to you via your [facility], Disp: 2 Bottle, Rfl: 0 Allergies:   Allergies   Allergen Reactions   ??? Iodine And Iodide Containing Products Anaphylaxis   ??? Statins-Hmg-Coa Reductase Inhibitors      Side effects   ???  Sulfa (Sulfonamide Antibiotics)    ??? Oxycodone Nausea Only     Makes her feel weird       Pertinent Social Hx and Habits: EMR reviewed    Pertinent Family Hx: EMR reviewed    Objective:      Vitals:    11/10/19 1417   BP: 124/70   BP Site: R Arm   BP Position: Sitting   BP Cuff Size: Large   Pulse: 64   Temp: 36.8 ??C (98.3 ??F)   TempSrc: Oral   SpO2: 97%   Weight: (!) 109.3 kg (240 lb 14.4 oz)   Height: 170.2 cm (5' 7)        Physical Exam:    General Appearance: WDWN in NAD, Obese     Skin: W, D, I  HEENT: PERRLA, EOMI  Respiratory: Clear throughout  Cardio: RRR  Abdomen: Soft  Neurologic: A & O X 4, Grossly intact, stable gait  GU: No CVA tenderness  PSYCH: Behavior calm and cooperative, depressed. PSQ 18    Diagnostics:     Lab Results   Component Value Date    WBC 9.7 06/30/2019    HGB 11.7 (L) 06/30/2019    HCT 36.2 06/30/2019    PLT 245 06/30/2019       Lab Results   Component Value Date    NA 136 06/30/2019    K 3.6 06/30/2019    CL 97 (L) 06/30/2019    CO2 31.0 (H) 06/30/2019    BUN 7 06/30/2019    CREATININE 0.57 (L) 06/30/2019    GLU 116 06/30/2019    CALCIUM 8.8 06/30/2019    MG 1.6 06/28/2019    PHOS 3.3 06/28/2019       Lab Results   Component Value Date    BILITOT 0.5 06/30/2019    PROT 6.4 (L) 06/30/2019    ALBUMIN 3.3 (L) 06/30/2019    ALT 18 06/30/2019    AST 23 06/30/2019    ALKPHOS 111 06/30/2019    GGT 34 02/05/2013       Lab Results   Component Value Date    PT 13.2 (H) 05/06/2019    INR 1.14 05/06/2019    APTT 26.8 02/01/2011       No results found for: A1C      Olena Leatherwood, NP

## 2019-11-10 NOTE — Unmapped (Signed)
Thank you for your interest in the COVID-19 vaccines. [facility] is committed to sharing the latest facts about the vaccines with you. It is important to read all of the available information about the COVID-19 vaccines so you can make the best decision for yourself and your loved ones.    I encourage you to visit [facility]???s COVID-19 Vaccine Hub, [email or web address], which offers information on the science, safety and availability of the vaccine. It is a mobile-friendly site (so you can view easily on your phone) and is updated regularly to provide the most current information. This site will include information about when and how to access a vaccine for your specific age and medical conditions, as vaccines becomes available in accordance with guidance from the [place].     Call [phone] to schedule your COVID vaccination.    Thank you,  Dr. Nira Retort

## 2019-12-28 NOTE — Unmapped (Signed)
[facility] Clinical Assessment & Refill Coordination Note    Marie Quinn, [place]: Sep 15, 1956  Phone: [phone] (home)     All above HIPAA information was verified with patient.     Was a Nurse, learning disability used for this call? No    Specialty Medication(s):   General Specialty: Repatha     Current Outpatient Medications   Medication Sig Dispense Refill   ??? albuterol 2.5 mg /3 mL (0.083 %) nebulizer solution Inhale the contents of 1 vial (2.5 mg total) by nebulization every four (4) hours as needed for wheezing. 25 vial 2   ??? albuterol HFA 90 mcg/actuation inhaler Inhale 2 puffs every four (4) hours as needed for wheezing. 18 g 1   ??? amLODIPine (NORVASC) 10 MG tablet Take 1 tablet (10 mg total) by mouth daily. 90 tablet 0   ??? aspirin (ECOTRIN) 81 MG tablet Take 81 mg by mouth daily.     ??? buPROPion (WELLBUTRIN SR) 150 MG 12 hr tablet Take 1 tablet (150 mg total) by mouth Two (2) times a day. 60 tablet 3   ??? citalopram (CELEXA) 40 MG tablet Take 1 tablet (40 mg total) by mouth daily. 90 tablet 3   ??? empty container (SHARPS CONTAINER) Misc Use as directed. 1 each 3   ??? empty container Misc Use as directed to dispose of injectable medications 1 each 3   ??? evolocumab (REPATHA SYRINGE) 140 mg/mL Syrg Inject the contents of 1 syringe (140 mg) under the skin every fourteen (14) days. 6 mL 3   ??? gabapentin (NEURONTIN) 100 MG capsule      ??? hydrOXYzine (ATARAX) 25 MG tablet Take 1 tablet (25 mg total) by mouth every six (6) hours as needed for anxiety and congestion. 90 tablet 3   ??? losartan (COZAAR) 100 MG tablet Take 1 tablet (100 mg total) by mouth daily. 90 tablet 0   ??? metoprolol succinate (TOPROL-XL) 100 MG 24 hr tablet Take 1 tablet (100 mg total) by mouth two (2) times a day. Repeat for elevated BP. 180 tablet 3   ??? omeprazole (PRILOSEC) 20 MG capsule Take 1 capsule (20 mg total) by mouth two (2) times a day. 180 capsule 1   ??? ondansetron (ZOFRAN-ODT) 8 MG disintegrating tablet      ??? peg-electrolyte soln (GOLYTELY) 420 gram SolR Take as directed in the instructions sent to you via your [facility] 2 Bottle 0     No current facility-administered medications for this visit.         Changes to medications: Elizzie reports no changes at this time.    Allergies   Allergen Reactions   ??? Iodine And Iodide Containing Products Anaphylaxis   ??? Statins-Hmg-Coa Reductase Inhibitors      Side effects   ??? Sulfa (Sulfonamide Antibiotics)    ??? Oxycodone Nausea Only     Makes her feel weird       Changes to allergies: No    SPECIALTY MEDICATION ADHERENCE     Repatha 140 mg/ml: 0 days of medicine on hand       Medication Adherence    Patient reported X missed doses in the last month: 0  Specialty Medication: Repatha  Patient is on additional specialty medications: No  Informant: patient          Specialty medication(s) dose(s) confirmed: Regimen is correct and unchanged.     Are there any concerns with adherence? No    Adherence counseling provided? Not  needed    CLINICAL MANAGEMENT AND INTERVENTION      Clinical Benefit Assessment:    Do you feel the medicine is effective or helping your condition? Yes    Clinical Benefit counseling provided? Not needed    Adverse Effects Assessment:    Are you experiencing any side effects? No    Are you experiencing difficulty administering your medicine? No    Quality of Life Assessment:    How many days over the past month did your hyperlipidemia  keep you from your normal activities? For example, brushing your teeth or getting up in the morning. 0    Have you discussed this with your provider? Not needed    Therapy Appropriateness:    Is therapy appropriate? Yes, therapy is appropriate and should be continued    DISEASE/MEDICATION-SPECIFIC INFORMATION      For patients on injectable medications: Patient currently has 0 doses left.  Next injection is scheduled for 01/10/20.    PATIENT SPECIFIC NEEDS     ? Does the patient have any physical, cognitive, or cultural barriers? No ? Is the patient high risk? No     ? Does the patient require a Care Management Plan? No     ? Does the patient require physician intervention or other additional services (i.e. nutrition, smoking cessation, social work)? No      SHIPPING     Specialty Medication(s) to be Shipped:   General Specialty: Repatha    Other medication(s) to be shipped: none     Changes to insurance: No    Delivery Scheduled: Yes, Expected medication delivery date: 01/04/20.     Medication will be delivered via Same Day Courier to the confirmed prescription address in [facility].    The patient will receive a drug information handout for each medication shipped and additional FDA Medication Guides as required.  Verified that patient has previously received a Conservation officer, historic buildings.    All of the patient's questions and concerns have been addressed.    Arnold Long   [facility] Specialty Pharmacist

## 2020-01-04 MED FILL — REPATHA SYRINGE 140 MG/ML SUBCUTANEOUS SYRINGE: 84 days supply | Qty: 6 | Fill #3 | Status: AC

## 2020-01-04 MED FILL — REPATHA SYRINGE 140 MG/ML SUBCUTANEOUS SYRINGE: SUBCUTANEOUS | 84 days supply | Qty: 6 | Fill #3

## 2020-02-08 ENCOUNTER — Ambulatory Visit: Admit: 2020-02-08 | Discharge: 2020-02-09 | Payer: MEDICARE | Attending: Family | Primary: Family

## 2020-02-08 DIAGNOSIS — J329 Chronic sinusitis, unspecified: Principal | ICD-10-CM

## 2020-02-08 DIAGNOSIS — J40 Bronchitis, not specified as acute or chronic: Principal | ICD-10-CM

## 2020-02-08 MED ORDER — PROMETHAZINE 6.25 MG-CODEINE 10 MG/5 ML SYRUP
0 refills | 0 days | Status: CP
Start: 2020-02-08 — End: ?

## 2020-02-08 MED ORDER — AMOXICILLIN 875 MG-POTASSIUM CLAVULANATE 125 MG TABLET
ORAL_TABLET | Freq: Two times a day (BID) | ORAL | 0 refills | 10.00000 days | Status: CP
Start: 2020-02-08 — End: 2020-02-18

## 2020-02-08 NOTE — Unmapped (Signed)
[facility] Primary Care at [facility] Note:  Dan Humphreys, [place] [address]. Phone [phone]    02/08/2020    Patient Name:   Marie Quinn    MRN: [id number]    Demographics:    Age-  64 y.o.     Date of Birth-  July 09, 1956    Chief complaint (CC):    Chief Complaint   Patient presents with   ??? F/U on Medical Management       Assessment/Plan:    Pleasant 64 y.o. female with SinuBronchtis. PMH of HTN, CHF, pancreatitis, anxiety, depression, PTSD, bilateral LE pain, asthma, Pneumonia  RF Amlodipine 10 mg qd, losartan 100 mg every day, Wellbutrin 150 mg bid, Citalopram 40 mg qd, hydroxyzine 25 mg qd.     I advised her Smoking cessation will help reduce risk of infection and pain.    30 minutes of total time, > 1/2 the office visit on face to face counseling and coordination of care [facility] records reviewed, referrals to psych and Neuro). Discussed medical, dietary, lifestyle, and health maintenance modifications to optimize health. Due to COVID-19, masks worn and standard precautions followed during visit. Medication adherence and barriers to the treatment plan have been addressed.  Patient voiced understanding, needs F/U visit as scheduled.     Diagnosis ICD-10-CM Plan/Associated Orders   1. Sinubronchitis   J32.9  J40 Augmentin 875 mg bid for 10 days  Promethazine/Codiene Syrup  1-2 tsp q6 hours  prn     2. Sleep apnea, unspecified type   G47.30 Polysomnography (Standard) has been advised, not completed   3. Essential hypertension  I10 losartan (COZAAR) 100 MG tablet po every day  Amlodipine 10 mg qd   4. Anxiety  F41.9 hydrOXYzine (ATARAX) 25 MG tablet tid/qid  prn     citalopram (CELEXA) 40 MG tablet po qd     Ambulatory referral to Psychiatry   5. Depression, unspecified depression type  F32.9 citalopram (CELEXA) 40 MG tablet     buPROPion (WELLBUTRIN SR) 150 MG 12 hr tablet  1 po bid     Ambulatory referral to Psychiatry   6. Tobacco dependence  F17.200 buPROPion (WELLBUTRIN SR) 150 MG 12 hr tablet   7. Post traumatic stress disorder (PTSD)  F43.10 citalopram (CELEXA) 40 MG tablet     buPROPion (WELLBUTRIN SR) 150 MG 12 hr tablet     Ambulatory referral to Psychiatry       Health Maintenance:   Health Maintenance Due   Topic Date Due   ??? COVID-19 Vaccine (1) Never done   ??? Pap Smear (21-65)  Never done   ??? Zoster Vaccines (1 of 2) Never done     Reviewed:  Immunizations: TDaP (UTD), Influenza (UTD), Pneumovax (UTD) Shingles - Due to COVID-19.  Mammograms - pending  Pap Smear - Due - deferred  ECG  Colonoscopy - Due    Subjective:    History of present illness (HPI):       Marie Quinn is a 64 y.o. female who presents C/O cough and congestion for 4 days. Additionally, we discussed her multiple medical problems Reports fatigue, I feel like I'm sick. Denies any alcohol use. Medications used in the past were reviewed. Patient has required emergency room treatment for these symptoms, and has required hospitalization.     Patient is still smoking - 1/3 ppd. She states anytime when I get stressed or upset, I reach for a cigarette. She expresses she knows she needs to quit. Past treatment includes:  Chantix (pt had MI while taking, does not want to resume), Wellbutrin (pt reports effective).     Denies HA, Fever, chest pain, bladder issues, or swelling. (+) fatigue and anxiety. New spot on head. Depression.    Relevant ROS: Reviewed 12 systems, positive findings listed, all others negative.    Pertinent Past Med Hx:    Past Medical History:   Diagnosis Date   ??? Anxiety    ??? Arthritis     Adult life   ??? Asthma    ??? BPPV (benign paroxysmal positional vertigo)    ??? Chronic diastolic CHF (congestive heart failure) (CMS-HCC)    ??? COPD (chronic obstructive pulmonary disease) (CMS-HCC)    ??? Depression    ??? Fibromyalgia    ??? GERD (gastroesophageal reflux disease)     Adult life   ??? Heart disease    ??? Heart murmur    ??? Hyperlipidemia    ??? Hypertension    ??? Myocardial infarction (CMS-HCC) 2008    s/p stent placement x 1 at [facility]   ??? Obesity    ??? Panic disorder    ??? Substance abuse (CMS-HCC)    ??? Urge incontinence        Medications:       Current Outpatient Medications:   ???  albuterol 2.5 mg /3 mL (0.083 %) nebulizer solution, Inhale the contents of 1 vial (2.5 mg total) by nebulization every four (4) hours as needed for wheezing., Disp: 25 vial, Rfl: 2  ???  albuterol HFA 90 mcg/actuation inhaler, Inhale 2 puffs every four (4) hours as needed for wheezing., Disp: 18 g, Rfl: 1  ???  amLODIPine (NORVASC) 10 MG tablet, Take 1 tablet (10 mg total) by mouth daily., Disp: 90 tablet, Rfl: 0  ???  aspirin (ECOTRIN) 81 MG tablet, Take 81 mg by mouth daily., Disp: , Rfl:   ???  buPROPion (WELLBUTRIN SR) 150 MG 12 hr tablet, Take 1 tablet (150 mg total) by mouth Two (2) times a day., Disp: 60 tablet, Rfl: 3  ???  citalopram (CELEXA) 40 MG tablet, Take 1 tablet (40 mg total) by mouth daily., Disp: 90 tablet, Rfl: 3  ???  evolocumab (REPATHA SYRINGE) 140 mg/mL Syrg, Inject the contents of 1 syringe (140 mg) under the skin every fourteen (14) days., Disp: 6 mL, Rfl: 3  ???  hydrOXYzine (ATARAX) 25 MG tablet, Take 1 tablet (25 mg total) by mouth every six (6) hours as needed for anxiety and congestion., Disp: 90 tablet, Rfl: 3  ???  losartan (COZAAR) 100 MG tablet, Take 1 tablet (100 mg total) by mouth daily., Disp: 90 tablet, Rfl: 0  ???  metoprolol succinate (TOPROL-XL) 100 MG 24 hr tablet, Take 1 tablet (100 mg total) by mouth two (2) times a day. Repeat for elevated BP., Disp: 180 tablet, Rfl: 3  ???  omeprazole (PRILOSEC) 20 MG capsule, Take 1 capsule (20 mg total) by mouth two (2) times a day., Disp: 180 capsule, Rfl: 1  ???  ondansetron (ZOFRAN-ODT) 8 MG disintegrating tablet, , Disp: , Rfl:   ???  peg-electrolyte soln (GOLYTELY) 420 gram SolR, Take as directed in the instructions sent to you via your [facility], Disp: 2 Bottle, Rfl: 0  ???  empty container (SHARPS CONTAINER) Misc, Use as directed., Disp: 1 each, Rfl: 3  ???  empty container Misc, Use as directed to dispose of injectable medications, Disp: 1 each, Rfl: 3  ???  gabapentin (NEURONTIN) 100 MG capsule, , Disp: , Rfl:  Allergies:   Allergies   Allergen Reactions   ??? Iodine And Iodide Containing Products Anaphylaxis   ??? Statins-Hmg-Coa Reductase Inhibitors      Side effects   ??? Sulfa (Sulfonamide Antibiotics)    ??? Oxycodone Nausea Only     Makes her feel weird       Pertinent Social Hx and Habits: EMR reviewed    Pertinent Family Hx: EMR reviewed    Objective:      Vitals:    02/08/20 1119   BP: 130/88   BP Site: L Arm   BP Position: Sitting   BP Cuff Size: X-Large   Pulse: 84   Resp: 19   Temp: 37.2 ??C (98.9 ??F)   TempSrc: Oral   SpO2: 97%   Weight: (!) 112 kg (247 lb)   Height: 170.2 cm (5' 7)        Physical Exam:    General Appearance: WDWN in NAD, Obese     Skin: W, D, I  HEENT: PERRLA, EOMI  Respiratory: Clear throughout  Cardio: RRR  Abdomen: Soft  Neurologic: A & O X 4, Grossly intact, stable gait  GU: No CVA tenderness  PSYCH: Behavior calm and cooperative, depressed. PSQ 18    Diagnostics:     Lab Results   Component Value Date    WBC 9.7 06/30/2019    HGB 11.7 (L) 06/30/2019    HCT 36.2 06/30/2019    PLT 245 06/30/2019       Lab Results   Component Value Date    NA 136 06/30/2019    K 3.6 06/30/2019    CL 97 (L) 06/30/2019    CO2 31.0 (H) 06/30/2019    BUN 7 06/30/2019    CREATININE 0.57 (L) 06/30/2019    GLU 116 06/30/2019    CALCIUM 8.8 06/30/2019    MG 1.6 06/28/2019    PHOS 3.3 06/28/2019       Lab Results   Component Value Date    BILITOT 0.5 06/30/2019    PROT 6.4 (L) 06/30/2019    ALBUMIN 3.3 (L) 06/30/2019    ALT 18 06/30/2019    AST 23 06/30/2019    ALKPHOS 111 06/30/2019    GGT 34 02/05/2013       Lab Results   Component Value Date    PT 13.2 (H) 05/06/2019    INR 1.14 05/06/2019    APTT 26.8 02/01/2011       No results found for: A1C      Olena Leatherwood, NP

## 2020-02-09 NOTE — Unmapped (Signed)
Prior authorization for Promethazine-Codieine 6.25-10mg /28ml has been denied

## 2020-02-10 NOTE — Unmapped (Signed)
Attempted to call patient, no answer left brief generic message.

## 2020-02-18 MED ORDER — METHYLPREDNISOLONE 4 MG TABLETS IN A DOSE PACK
0 days
Start: 2020-02-18 — End: ?

## 2020-03-09 MED ORDER — MELOXICAM 15 MG TABLET
0 days
Start: 2020-03-09 — End: ?

## 2020-03-21 MED ORDER — TRAMADOL 50 MG TABLET
0 days
Start: 2020-03-21 — End: ?

## 2020-03-22 DIAGNOSIS — J40 Bronchitis, not specified as acute or chronic: Principal | ICD-10-CM

## 2020-03-22 DIAGNOSIS — R0600 Dyspnea, unspecified: Principal | ICD-10-CM

## 2020-03-22 LAB — EGFR CKD-EPI NON-AA FEMALE
Glomerular filtration rate/1.73 sq M.predicted.non black:ArVRat:Pt:Ser/Plas/Bld:Qn:Creatinine-based formula (CKD-EPI): 69

## 2020-03-22 LAB — COMPREHENSIVE METABOLIC PANEL
ALBUMIN: 3.4 g/dL (ref 3.4–5.0)
ALKALINE PHOSPHATASE: 106 U/L (ref 46–116)
ALT (SGPT): 20 U/L (ref 10–49)
ANION GAP: <3 mmol/L — ABNORMAL LOW (ref 3–11)
AST (SGOT): 19 U/L (ref <34)
BILIRUBIN TOTAL: 0.3 mg/dL (ref 0.3–1.2)
BUN / CREAT RATIO: 17
CALCIUM: 8.9 mg/dL (ref 8.7–10.4)
CO2: 26.8 mmol/L (ref 20.0–31.0)
CREATININE: 0.9 mg/dL — ABNORMAL HIGH (ref 0.50–0.80)
EGFR CKD-EPI AA FEMALE: 79 mL/min/{1.73_m2}
EGFR CKD-EPI NON-AA FEMALE: 69 mL/min/{1.73_m2}
GLUCOSE RANDOM: 138 mg/dL (ref 70–179)
POTASSIUM: 4.2 mmol/L (ref 3.5–5.1)
PROTEIN TOTAL: 6.9 g/dL (ref 5.7–8.2)
SODIUM: 140 mmol/L (ref 135–145)

## 2020-03-22 LAB — CBC W/ AUTO DIFF
BASOPHILS ABSOLUTE COUNT: 0.1 10*9/L (ref 0.0–0.1)
EOSINOPHILS ABSOLUTE COUNT: 0.2 10*9/L (ref 0.0–0.7)
EOSINOPHILS RELATIVE PERCENT: 2.2 %
HEMOGLOBIN: 12.7 g/dL (ref 12.0–15.5)
LYMPHOCYTES ABSOLUTE COUNT: 2.8 10*9/L (ref 0.7–4.0)
LYMPHOCYTES RELATIVE PERCENT: 31 %
MEAN CORPUSCULAR HEMOGLOBIN CONC: 33.4 g/dL (ref 30.0–36.0)
MEAN CORPUSCULAR HEMOGLOBIN: 29.4 pg (ref 26.0–34.0)
MEAN CORPUSCULAR VOLUME: 88 fL (ref 82.0–98.0)
MONOCYTES ABSOLUTE COUNT: 0.7 10*9/L (ref 0.1–1.0)
MONOCYTES RELATIVE PERCENT: 7.8 %
NEUTROPHILS ABSOLUTE COUNT: 5.2 10*9/L (ref 1.7–7.7)
NEUTROPHILS RELATIVE PERCENT: 58.3 %
NUCLEATED RED BLOOD CELLS: 0 /100{WBCs} (ref <=4)
PLATELET COUNT: 238 10*9/L (ref 150–450)
RED BLOOD CELL COUNT: 4.31 10*12/L (ref 3.90–5.03)
RED CELL DISTRIBUTION WIDTH: 14.6 % (ref 12.0–15.0)
WBC ADJUSTED: 8.9 10*9/L (ref 3.5–10.5)

## 2020-03-22 LAB — PLATELET COUNT: Platelets:NCnc:Pt:Bld:Qn:Automated count: 238

## 2020-03-22 LAB — TROPONIN I: Troponin I.cardiac:MCnc:Pt:Ser/Plas:Qn:: <0.06

## 2020-03-22 LAB — INR: Coagulation tissue factor induced.INR:RelTime:Pt:PPP:Qn:Coag: 1.19

## 2020-03-22 MED ORDER — AZITHROMYCIN 250 MG TABLET
ORAL_TABLET | Freq: Every day | ORAL | 0 refills | 4 days | Status: CP
Start: 2020-03-22 — End: 2020-03-26

## 2020-03-22 MED ORDER — PREDNISONE 20 MG TABLET
ORAL_TABLET | Freq: Every day | ORAL | 0 refills | 5.00000 days | Status: CP
Start: 2020-03-22 — End: 2020-03-27

## 2020-03-23 ENCOUNTER — Ambulatory Visit
Admit: 2020-03-23 | Discharge: 2020-03-23 | Disposition: A | Discharge status: Home / Self Care | Payer: MEDICARE | Attending: Emergency Medicine

## 2020-03-23 ENCOUNTER — Emergency Department
Admit: 2020-03-23 | Discharge: 2020-03-23 | Disposition: A | Discharge status: Home / Self Care | Payer: MEDICARE | Attending: Emergency Medicine

## 2020-03-23 MED ADMIN — azithromycin (ZITHROMAX) tablet 500 mg: 500 mg | ORAL | @ 02:00:00 | Stop: 2020-03-22

## 2020-03-23 NOTE — Unmapped (Signed)
Embedded Case Manager spoke with patient today via telephone for case management outreach    The following summary provides information reviewed or updated during today's outreach call.      Transitions of Care from ED    Call attempt: 1  Admission date: 03/22/20   Since your discharge, are your symptoms better, same, or worse?: Same       Have you developed any new symptoms?: No  Has a f/u appt been scheduled within 14 days of discharge? : Yes with Primary PCP clinic  Appt with Primary PCP: 03/29/20  Patient post discharge:  Are you able to make it to your f/u appt.?: Yes  Medications:  Were you able to pick up all of your newly prescribed medications (and/or any necessary refills)? Yes (Comment: plans to pick up and start today )   Medication Review          Social Determinants of Health     Financial Resource Strain: Low Risk    ??? Difficulty of Paying Living Expenses: Not hard at all        Food Insecurity: No Food Insecurity   ??? Worried About Programme researcher, broadcasting/film/video in the Last Year: Never true   ??? Ran Out of Food in the Last Year: Never true        Transportation Needs: No Transportation Needs   ??? Lack of Transportation (Medical): No   ??? Lack of Transportation (Non-Medical): No     Housing/Utilities     Questions Responses    Within the past 12 months, have you ever stayed: outside, in a car, in a tent, in an overnight shelter, or temporarily in someone else's home (i.e. couch-surfing)? No    Are you worried about losing your housing? No    Within the past 12 months, have you been unable to get utilities (heat, electricity) when it was really needed? No      Literacy     Questions Responses    How often do you need to have someone help you when you read instructions, pamphlets, or other written material from your doctor or pharmacy? Never    Not reviewed.              Interventions provided: N/A - no concerns identified.     Education/Interventions provided: Care Coordination, Scheduled follow-up appointment with Dr. Grace Isaac and Supportive Listening     Called pt to f/u after ED visit on 03/22/20. Pt reports continuing to feel unwell today. Continued chest heaviness, coughing, some SOB on and off, and bilateral lower leg swelling. Denies fevers, CP.   Reports that the lower leg swelling has been on and off for the past 2 weeks - discussed with ED provider possibly needing to add a water pill.   Dx with bronchitis in ED - started on z-pack and given cough medicine. Per pt, this is the 3rd time in 3 months that she has been dx with bronchitis and treated with abx. Plans to pick up and start new meds today. Has nebulizer at home for SOB - was using a couple of times per week prior to new dx.   Scheduled visit to f/u on symptoms and discuss addition of new medication for swelling on 03/29/20. Pt to call clinic with worsening symptoms or no improvement prior to scheduled appt.     Patient instructed to call [facility] at [phone] with after hours concerns.

## 2020-03-23 NOTE — Unmapped (Signed)
Pt stating she has on and off BLE edema for the last week. Pt stating for the last week she has had a cough and SOB. Pt stating her legs are feeling heavy. Pt stating increased SOB with laying flat. Pt stating she is coughing up green.

## 2020-03-23 NOTE — Unmapped (Signed)
[facility]  Emergency Department Provider Note       ED Clinical Impression      Final diagnoses:   Bronchitis (Primary)      Impression, ED Course, Assessment and Plan      Impression: Marie Quinn is a 64 y.o. female with a PMH of CAD s/p stent placement, MI, COPD, HTN, HLD, CHF (EF 60-65%), asthma, PTSD, dyslipidemia, acute pancreatitis with infected necrosis, and fibromyalgia presenting to the emergency department for evaluation of bilateral lower extremity swelling for the past week, central to left chest heaviness that began at 4:00pm today, and a few days of productive cough.    On exam patient is well appearing and in NAD. VS are WNL. HRRR. Lungs CTAB. Productive cough. No lower extremity edema.     Differential includes bronchitis vs pneumonia vs pulmonary edema vs URI vs ACS though less likely given no chest pain or PE given normal oxygen saturation with no leg swelling and PERC 0, low Well's score.    Plan for basic labs, troponin, coags, CXR, and EKG.     10:04 PM  Labs are consistent with the patient's baseline. CXR is unremarkable without evidence of PNA. Plan for discharge with instructions for follow up with primary care with z-pack and prednisone given smoking history. Counseled on importance of smoking cessation. Discussed my evaluation of the patient's symptoms, my clinical impression, and my proposed outpatient treatment plan with patient. We have discussed anticipatory guidance, scheduled follow-up, and careful return precautions. Patient expresses understanding, is agreeable and comfortable with the discharge plan. Denies any questions or concerns at this time. NAD noted upon discharge.      Additional Medical Decision Making     I have reviewed the vital signs and the nursing notes. Labs and radiology results that were available during my care of the patient were independently reviewed by me and considered in my medical decision making.     I directly visualized and independently interpreted the EKG tracing.   I independently visualized the radiology images.       Portions of this record have been created using Scientist, clinical (histocompatibility and immunogenetics). Dictation errors have been sought, but may not have been identified and corrected.  ____________________________________________     History      Chief Complaint  Shortness of Breath    HPI   Marie Quinn is a 64 y.o. female with a PMH of CAD s/p stent placement, MI, COPD, HTN, HLD, CHF (EF 60-65%), asthma, PTSD, dyslipidemia, acute pancreatitis with infected necrosis, and fibromyalgia presenting to the emergency department for evaluation of shortness of breath. Patient reports bilateral lower extremity swelling for the past week, central to left chest heaviness that began at 4:00pm today, and a few days of productive cough. Patient states that her chest pain does not resemble her MI, but notes that her cough, tastes like PNA. Patient has an inhaler and a nebulizer at home, both of which provided her no relief. She denies recent travel. She denies history of blood clots. Patient does not have blood thinners. Patient is a smoker who last smoked yesterday. Patient uses EtOH occasionally. Patient had one shot of liquor today. She denies fevers, chills, emesis, or any other medical concerns at this time.      Past Medical History:   Diagnosis Date   ??? Anxiety    ??? Arthritis     Adult life   ??? Asthma    ??? BPPV (benign paroxysmal positional  vertigo)    ??? Chronic diastolic CHF (congestive heart failure) (CMS-HCC)    ??? COPD (chronic obstructive pulmonary disease) (CMS-HCC)    ??? Depression    ??? Fibromyalgia    ??? GERD (gastroesophageal reflux disease)     Adult life   ??? Heart disease    ??? Heart murmur    ??? Hyperlipidemia    ??? Hypertension    ??? Myocardial infarction (CMS-HCC) 2008    s/p stent placement x 1 at [facility]   ??? Obesity    ??? Panic disorder    ??? Substance abuse (CMS-HCC)    ??? Urge incontinence      Patient Active Problem List Diagnosis   ??? 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 Hypertension   ??? Myocardial infarction (CMS-HCC)   ??? Mild intermittent asthma without complication   ??? Anxiety   ??? Depression   ??? Post traumatic stress disorder (PTSD)   ??? Fibromyalgia   ??? Panic disorder   ??? Chest pain   ??? Urge incontinence   ??? Back spasm   ??? Dyslipidemia   ??? Epigastric pain   ??? Nausea & vomiting   ??? Elevated liver enzymes   ??? Acute pancreatitis with infected necrosis   ??? Tobacco abuse   ??? Chronic diastolic CHF (congestive heart failure) (CMS-HCC)   ??? Recurrent acute pancreatitis     Past Surgical History:   Procedure Laterality Date   ??? cardiac stent x 1     ??? CHOLECYSTECTOMY     ??? HYSTERECTOMY  1990    one ovary remains   ??? PR COLORECTAL SCRN; HI RISK IND N/A 08/06/2019    Procedure: COLOREC CANCR SCR; COLNSCPY;  Surgeon: Charm Rings, MD;  [facility]: [facility];  Service: Gastroenterology   ??? PR ERCP REMOVE FOREIGN BODY/STENT BILIARY/PANC DUCT N/A 08/09/2019    Procedure: ENDOSCOPIC RETROGRADE CHOLANGIOPANCREATOGRAPHY (ERCP); W/ REMOVAL OF FOREIGN BODY/STENT FROM BILIARY/PANCREATIC DUCT(S);  Surgeon: Vonda Antigua, MD;  Location: [facility];  Service: Gastroenterology   ??? PR ERCP STENT PLACEMENT BILIARY/PANCREATIC DUCT N/A 06/28/2019    Procedure: ENDOSCOPIC RETROGRADE CHOLANGIOPANCREATOGRAPHY (ERCP); WITH PLACEMENT OF ENDOSCOPIC STENT INTO BILIARY OR PANCREATIC DUCT;  Surgeon: Vonda Antigua, MD;  [facility]: [facility];  Service: Gastroenterology   ??? PR ERCP,SPHINCTEROTOMY N/A 11/12/2018    Procedure: ERCP; W/SPHINCTEROTOMY/PAPILLOTOMY;  Surgeon: Vonda Antigua, MD;  [facility]: [facility];  Service: Gastroenterology   ??? PR ERCP,W/REMOVAL STONE,BIL/PANCR DUCTS N/A 11/12/2018    Procedure: ERCP; W/ENDOSCOPIC RETROGRADE REMOVAL OF CALCULUS/CALCULI FROM BILIARY &/OR PANCREATIC DUCTS;  Surgeon: Vonda Antigua, MD;  Location: [facility];  Service: Gastroenterology   ??? TONSILLECTOMY     ??? TUBAL LIGATION  1990    Tubal pregnacy     No current facility-administered medications for this encounter.    Current Outpatient Medications:   ???  albuterol 2.5 mg /3 mL (0.083 %) nebulizer solution, Inhale the contents of 1 vial (2.5 mg total) by nebulization every four (4) hours as needed for wheezing., Disp: 25 vial, Rfl: 2  ???  albuterol HFA 90 mcg/actuation inhaler, Inhale 2 puffs every four (4) hours as needed for wheezing., Disp: 18 g, Rfl: 1  ???  amLODIPine (NORVASC) 10 MG tablet, Take 1 tablet (10 mg total) by mouth daily., Disp: 90 tablet, Rfl: 0  ???  aspirin (ECOTRIN) 81 MG tablet, Take 81 mg by mouth  daily., Disp: , Rfl:   ???  buPROPion (WELLBUTRIN SR) 150 MG 12 hr tablet, Take 1 tablet (150 mg total) by mouth Two (2) times a day., Disp: 60 tablet, Rfl: 3  ???  citalopram (CELEXA) 40 MG tablet, Take 1 tablet (40 mg total) by mouth daily., Disp: 90 tablet, Rfl: 3  ???  empty container (SHARPS CONTAINER) Misc, Use as directed., Disp: 1 each, Rfl: 3  ???  empty container Misc, Use as directed to dispose of injectable medications, Disp: 1 each, Rfl: 3  ???  evolocumab (REPATHA SYRINGE) 140 mg/mL Syrg, Inject the contents of 1 syringe (140 mg) under the skin every fourteen (14) days., Disp: 6 mL, Rfl: 3  ???  gabapentin (NEURONTIN) 100 MG capsule, , Disp: , Rfl:   ???  hydrOXYzine (ATARAX) 25 MG tablet, Take 1 tablet (25 mg total) by mouth every six (6) hours as needed for anxiety and congestion., Disp: 90 tablet, Rfl: 3  ???  losartan (COZAAR) 100 MG tablet, Take 1 tablet (100 mg total) by mouth daily., Disp: 90 tablet, Rfl: 0  ???  metoprolol succinate (TOPROL-XL) 100 MG 24 hr tablet, Take 1 tablet (100 mg total) by mouth two (2) times a day. Repeat for elevated BP., Disp: 180 tablet, Rfl: 3  ???  omeprazole (PRILOSEC) 20 MG capsule, Take 1 capsule (20 mg total) by mouth two (2) times a day., Disp: 180 capsule, Rfl: 1 ???  ondansetron (ZOFRAN-ODT) 8 MG disintegrating tablet, , Disp: , Rfl:   ???  peg-electrolyte soln (GOLYTELY) 420 gram SolR, Take as directed in the instructions sent to you via your [facility], Disp: 2 Bottle, Rfl: 0  ???  promethazine-codeine (PHENERGAN WITH CODEINE) 6.25-10 mg/5 mL syrup, 1-2 tsp po every 6 hours as needed for cough and congestion., Disp: 120 mL, Rfl: 0    Allergies  Iodine and iodide containing products, Statins-hmg-coa reductase inhibitors, Sulfa (sulfonamide antibiotics), and Oxycodone    Family History   Problem Relation Age of Onset   ??? Heart disease Father         Deacesed   ??? Coronary artery disease Father         quadruple bypass   ??? Mental illness Father    ??? Asthma Father    ??? COPD Father    ??? Depression Father    ??? Hypertension Father    ??? Cancer Sister    ??? Arthritis Daughter    ??? Depression Daughter    ??? Asthma Sister    ??? Cancer Sister         Colan cancer deceased   ??? COPD Sister    ??? Depression Sister    ??? Early death Sister         14 yrs old   ??? Cancer Paternal Aunt         Thyroid cancer   ??? Cancer Maternal Grandmother         Brain cancer   ??? Depression Mother    ??? Diabetes Mother    ??? Hypertension Mother    ??? Diabetes Brother    ??? Vision loss Maternal Aunt         Blind at birth   ??? Substance Abuse Disorder Neg Hx    ??? Alcohol abuse Neg Hx    ??? Drug abuse Neg Hx      Social History  Social History     Tobacco Use   ??? Smoking status: Former Smoker     Years:  25.00     Types: Cigarettes     Quit date: 05/02/2019     Years since quitting: 0.8   ??? Smokeless tobacco: Never Used   ??? Tobacco comment: Pt smokes  1.5cpd. Pt expressed desire to quit    Vaping Use   ??? Vaping Use: Never used   Substance Use Topics   ??? Alcohol use: Not Currently   ??? Drug use: Not Currently     Comment: not used cocaine in 13 years     Review of Systems     Constitutional: Negative for fever.  Eyes: Negative for visual changes.  ENT: Negative for sore throat.  Cardiovascular: Positive for chest pain.  Respiratory: Positive for shortness of breath and cough.   Gastrointestinal: Negative for abdominal pain, vomiting or diarrhea.  Genitourinary: Negative for dysuria.   Musculoskeletal: Positive for lower extremity swelling. Negative for back pain.  Skin: Negative for rash.  Neurological: Negative for headaches, focal weakness or numbness.    All other systems have been reviewed and are negative except as otherwise documented.     Physical Exam     This provider entered the patient's room: Yes:    ??? If this provider did not enter the room, a comprehensive physical exam was not able to be performed due to increased infection risk to themselves, other providers, staff and other patients), as well as to conserve personal protective equipment (PPE) utilization during the COVID-19 pandemic.    ??? If this provider did enter the patient room, the following was PPE worn: Surgical mask, eye protection and gloves    ED Triage Vitals [03/22/20 2023]   Enc Vitals Group      BP 104/55      Heart Rate 65      SpO2 Pulse       Resp 20      Temp 36.9 ??C (98.4 ??F)      Temp Source Oral      SpO2 96 %      Weight (!) 111.1 kg (245 lb)      Height 1.702 m (5' 7)     Constitutional: Alert and oriented. Well appearing and in no distress.  Eyes: Conjunctivae are normal.  ENT       Head: Normocephalic and atraumatic.       Nose: No congestion.       Mouth/Throat: Mucous membranes are moist.       Neck: No stridor.  Hematological/Lymphatic/Immunilogical: No cervical lymphadenopathy.  Cardiovascular: Normal rate, regular rhythm. Normal and symmetric distal pulses are present in all extremities.  Respiratory: Normal respiratory effort. Breath sounds are normal.  Gastrointestinal: Soft and nontender. There is no CVA tenderness.  Musculoskeletal: Normal range of motion in all extremities.       Right lower leg: No tenderness or edema.       Left lower leg: No tenderness or edema.  Neurologic: Normal speech and language. No gross focal neurologic deficits are appreciated.  Skin: Skin is warm, dry and intact. No rash noted.  Psychiatric: Mood and affect are normal. Speech and behavior are normal.     EKG     Encounter Date: 03/22/20   ECG 12 Lead   Result Value    EKG Systolic BP     EKG Diastolic BP     EKG Ventricular Rate 65    EKG Atrial Rate 65    EKG P-R Interval 156    EKG QRS Duration 88  EKG Q-T Interval 450    EKG QTC Calculation 468    EKG Calculated P Axis -10    EKG Calculated R Axis 21    EKG Calculated T Axis 41    QTC Fredericia 462    Narrative    NORMAL SINUS RHYTHM  NORMAL ECG  WHEN COMPARED WITH ECG OF 14-May-2019 23:32,  NO SIGNIFICANT CHANGE WAS FOUND  Confirmed by Huel Coventry [id number]) on 03/23/2020 8:38:31 AM          Radiology     XR Chest 2 views   Final Result      Clear lungs.         ______________________________________________________   Documentation assistance was provided by Sande Rives, Scribe, on March 22, 2020 at 9:51 PM for Pearlean Brownie, MD    Documentation assistance was provided by the scribe in my presence.  The documentation recorded by the scribe has been reviewed by me and accurately reflects the services I personally performed.    Pearlean Brownie, MD  Administration Fellow  Emergency Medicine  [place] [facility] [place], [place]  03/23/20 [phone]

## 2020-03-29 ENCOUNTER — Ambulatory Visit: Admit: 2020-03-29 | Discharge: 2020-03-30 | Payer: MEDICARE

## 2020-03-29 DIAGNOSIS — R600 Localized edema: Principal | ICD-10-CM

## 2020-03-29 DIAGNOSIS — Z720 Tobacco use: Principal | ICD-10-CM

## 2020-03-29 DIAGNOSIS — F321 Major depressive disorder, single episode, moderate: Principal | ICD-10-CM

## 2020-03-29 NOTE — Unmapped (Signed)
Subjective:     HPI: Notnamed Croucher is a 64 y.o. female here for extremity edema, depression, tobacco abuse  Chief Complaint   Patient presents with   ??? Follow-up     ED    ??? Leg Swelling       Treated with prednisone and a zpack for her Bronchitis.  Says  She is feeling better.  Suspect COPD . She as smoked for 30 years.  Smoking about 1 to 2 cigarettes a day.  Tobacco use is unchanged.  Smoking cessation counseling was provided.  Tobacco use will be reassessed at the next regular appointment.    Depression  Patient complains of depression. She complains of anhedonia, depressed mood, difficulty concentrating, feelings of worthlessness/guilt, hopelessness and impaired memory.  PHQ-9 is 17.  She does admit that she has not been able to get into counseling in the area.  Onset was approximately years ago. Symptoms have been unchanged  since that time. Current symptoms include: anhedonia, depressed mood, fatigue, feelings of worthlessness/guilt, hopelessness and impaired memory. Patient denies psychomotor agitation, recurrent thoughts of death, suicidal thoughts with specific plan, suicidal thoughts without plan and homcide. Family history significant for no psychiatric illness. Possible organic causes contributing are: none. Risk factors: negative life event life threatening incident.. Previous treatment includes individual therapy and medication. She complains of the following side effects from the treatment: none. But no longer working.  Would like to speak with cardiology about the potential use of Cymbalta.  Freely admits that she has some other health issues that Cymbalta may be able to help with.    Edema  Patient complains of edema in both lower legs. The edema has been moderate. Onset of symptoms was 3 weeks ago, and patient reports symptoms have gradually improved since that time. The edema is present all day. The patient states the problem is new. The swelling has been aggravated by dependency of involved area. The swelling has been relieved by sitting. Associated factors include: diagnosis of heart failure. Cardiac risk factors include advanced age (older than 28 for men, 41 for women), dyslipidemia, hypertension, obesity (BMI >= 30 kg/m2), sedentary lifestyle and smoking/ tobacco exposure.  Says her     I have reviewed past medical, surgical, medications, allergies, social and family histories today.    Objective:       General: Well developed. Well nourished. In no acute distress.  HEENT:  Normocephalic.  Atraumatic. PERRLA, EOMI     Neck:  No thyroid enlargement, carotid bruits, full range of motion  Heart:  Regular rate and rhythm . No murmurs or gallops  Lungs:  No respiratory distress.  Lungs clear to auscultation bilaterally.  Abdomen: Soft, bowel sounds normal, no tenderness, no organomegaly, no distension  Extremities:  No edema. Peripheral pulses normal  Musculoskeletal: Full ROM of lumbar spine, gait normal, negative SLR  Skin:  Warm, dry, no rashes  Neuro:  Non-focal. CN II-XII intact.   Psych:  Affect normal, answers questions appropriately, eye contact good, speech clear and coherent.      Review of systems negative unless otherwise noted as per HPI.    Assessment and Plan:     Alika was seen today for follow-up and leg swelling.    Diagnoses and all orders for this visit:    Current moderate episode of major depressive disorder, unspecified whether recurrent (CMS-HCC)    Lower extremity edema    Tobacco abuse    Will leave the medications that she is on namely  Celexa 40 mg and Wellbutrin 150 2 times a day.  Would like to discuss Cymbalta as a treatment for her depression with her cardiologist.  She only we have referred [facility] to [email or web address] for [facility]    Lower extremity edema was pretty mild today we did not add on any water pills we have asked her to elevate her legs.  Does have an appoint with cardiology in the near future.    Tobacco abuse.  Patient smokes 1 to 2 cigarettes a day.. She says she is quit several times but seems to go back when she gets stressed out.          [facility]:     Medication adherence and barriers to the treatment plan have been addressed. Opportunities to optimize healthy behaviors have been discussed. Patient / caregiver voiced understanding.      Note - This record has been created using [employer]. Chart creation errors have been sought, but may not always have been located. Such creation errors do not reflect on the standard of medical care.

## 2020-03-29 NOTE — Unmapped (Signed)
Patient Education         Stories From People With Recurring Depression (01:17)  Your health professional recommends that you watch this short online health video.  Hear from others who have repeated bouts of depression.  Purpose:  Provides examples of common feelings expressed by people who have recurrent depression.  Goal:  The user will learn about common feelings expressed by people who have recurrent depression.     How to watch the video    Scan the QR code   OR Visit the website    [email or web address]   Current as of: July 07, 2019??????????????????????????????Content Version: 12.9  ?? 2006-2021 [employer].   Care instructions adapted under license by [facility]. If you have questions about a medical condition or this instruction, always ask your healthcare professional. [employer] disclaims any warranty or liability for your use of this information.

## 2020-04-03 DIAGNOSIS — E785 Hyperlipidemia, unspecified: Principal | ICD-10-CM

## 2020-04-03 DIAGNOSIS — I2510 Atherosclerotic heart disease of native coronary artery without angina pectoris: Principal | ICD-10-CM

## 2020-04-03 MED ORDER — REPATHA SYRINGE 140 MG/ML SUBCUTANEOUS SYRINGE
SUBCUTANEOUS | 3 refills | 84.00000 days | Status: CP
Start: 2020-04-03 — End: ?
  Filled 2020-04-06: qty 6, 84d supply, fill #0

## 2020-04-03 NOTE — Unmapped (Signed)
[facility] Refill Coordination Note    Specialty Medication(s) to be Shipped:   General Specialty: Repatha    Other medication(s) to be shipped: N/A     Laureen Ochs, DOB: [birth year]  Phone: [phone] (home)       All above HIPAA information was verified with patient.     Was a Nurse, learning disability used for this call? No    Completed refill call assessment today to schedule patient's medication shipment from the [facility] [phone]).       Specialty medication(s) and dose(s) confirmed: Regimen is correct and unchanged.   Changes to medications: Deaisa reports no changes at this time.  Changes to insurance: No  Questions for the pharmacist: No    Confirmed patient received Welcome Packet with first shipment. The patient will receive a drug information handout for each medication shipped and additional FDA Medication Guides as required.       DISEASE/MEDICATION-SPECIFIC INFORMATION        For patients on injectable medications: Patient currently has 0 doses left.  Next injection is scheduled for 04/10/20.    SPECIALTY MEDICATION ADHERENCE     Medication Adherence    Patient reported X missed doses in the last month: 0  Specialty Medication: Repatha 140mg /ml  Patient is on additional specialty medications: No                Repatha 140 mg/ml: 0 days of medicine on hand       SHIPPING     Shipping address confirmed in Epic.     Delivery Scheduled: Yes, Expected medication delivery date: 04/06/20.     Medication will be delivered via Same Day Courier to the prescription address in Epic WAM.    Nancy Nordmann [facility] Specialty Technician

## 2020-04-06 MED FILL — REPATHA SYRINGE 140 MG/ML SUBCUTANEOUS SYRINGE: 84 days supply | Qty: 6 | Fill #0 | Status: AC

## 2020-04-18 DIAGNOSIS — I10 Essential (primary) hypertension: Principal | ICD-10-CM

## 2020-04-18 MED ORDER — LOSARTAN 100 MG TABLET
ORAL_TABLET | 0 refills | 0 days | Status: CP
Start: 2020-04-18 — End: ?

## 2020-04-18 NOTE — Unmapped (Signed)
P/C requesting refill on losartan, last ordered on 11/10/19 last visit 03/29/20

## 2020-05-09 ENCOUNTER — Ambulatory Visit: Admit: 2020-05-09 | Discharge: 2020-05-11 | Payer: MEDICARE

## 2020-05-15 NOTE — Unmapped (Signed)
Patient called to ask for a referral to Neurology in follow up from sleep study.   Could you please place this referral, thank you.

## 2020-05-16 MED ORDER — AMLODIPINE 10 MG TABLET
ORAL_TABLET | 0 refills | 0 days | Status: CP
Start: 2020-05-16 — End: ?

## 2020-05-16 NOTE — Unmapped (Signed)
P/C requesting refill on 05/16/20 , last ordered on 11/10/19, last visit 03/29/20.

## 2020-05-19 DIAGNOSIS — G4733 Obstructive sleep apnea (adult) (pediatric): Principal | ICD-10-CM

## 2020-05-22 DIAGNOSIS — R1013 Epigastric pain: Principal | ICD-10-CM

## 2020-05-22 MED ORDER — OMEPRAZOLE 20 MG CAPSULE,DELAYED RELEASE
ORAL_CAPSULE | Freq: Two times a day (BID) | ORAL | 1 refills | 90 days | Status: CP
Start: 2020-05-22 — End: 2021-05-22

## 2020-05-22 NOTE — Unmapped (Signed)
P/C requesting refill on 05/22/20 , last ordered on 09/27/19, last visit 03/29/20.    Drug-Drug interaction warning, pending to you please.

## 2020-06-26 NOTE — Unmapped (Signed)
[facility] Clinical Assessment & Refill Coordination Note    Marie Quinn, [place]: 1956-06-05  Phone: [phone] (home)     All above HIPAA information was verified with patient.     Was a Nurse, learning disability used for this call? No    Specialty Medication(s):   General Specialty: Repatha     Current Outpatient Medications   Medication Sig Dispense Refill   ??? pregabalin (LYRICA) 25 MG capsule Take 25 mg by mouth Two (2) times a day.     ??? albuterol 2.5 mg /3 mL (0.083 %) nebulizer solution Inhale the contents of 1 vial (2.5 mg total) by nebulization every four (4) hours as needed for wheezing. 25 vial 2   ??? albuterol HFA 90 mcg/actuation inhaler Inhale 2 puffs every four (4) hours as needed for wheezing. 18 g 1   ??? amLODIPine (NORVASC) 10 MG tablet TAKE (1) TABLET BY MOUTH EVERY DAY (Patient taking differently: 5 mg. ) 90 tablet 0   ??? aspirin (ECOTRIN) 81 MG tablet Take 81 mg by mouth daily.     ??? buPROPion (WELLBUTRIN SR) 150 MG 12 hr tablet Take 1 tablet (150 mg total) by mouth Two (2) times a day. 60 tablet 3   ??? citalopram (CELEXA) 40 MG tablet Take 1 tablet (40 mg total) by mouth daily. 90 tablet 3   ??? empty container (SHARPS CONTAINER) Misc Use as directed. 1 each 3   ??? empty container Misc Use as directed to dispose of injectable medications 1 each 3   ??? evolocumab (REPATHA SYRINGE) 140 mg/mL Syrg Inject the contents of 1 syringe (140 mg) under the skin every fourteen (14) days. 6 mL 3   ??? gabapentin (NEURONTIN) 100 MG capsule  (Patient not taking: Reported on 02/08/2020)     ??? hydrOXYzine (ATARAX) 25 MG tablet Take 1 tablet (25 mg total) by mouth every six (6) hours as needed for anxiety and congestion. 90 tablet 3   ??? losartan (COZAAR) 100 MG tablet TAKE (1) TABLET BY MOUTH EVERY DAY 90 tablet 0   ??? meloxicam (MOBIC) 15 MG tablet      ??? methylPREDNISolone (MEDROL DOSEPACK) 4 mg tablet      ??? metoprolol succinate (TOPROL-XL) 100 MG 24 hr tablet Take 1 tablet (100 mg total) by mouth two (2) times a day. Repeat for elevated BP. 180 tablet 3   ??? omeprazole (PRILOSEC) 20 MG capsule Take 1 capsule (20 mg total) by mouth Two (2) times a day. 180 capsule 1   ??? ondansetron (ZOFRAN-ODT) 8 MG disintegrating tablet      ??? peg-electrolyte soln (GOLYTELY) 420 gram SolR Take as directed in the instructions sent to you via your [facility] (Patient not taking: Reported on 03/29/2020) 2 Bottle 0   ??? predniSONE (DELTASONE) 10 mg tablet pack prednisone 10 mg tablets in a dose pack   Take 1 dose pk by oral route.     ??? promethazine-codeine (PHENERGAN WITH CODEINE) 6.25-10 mg/5 mL syrup 1-2 tsp po every 6 hours as needed for cough and congestion. 120 mL 0   ??? traMADoL (ULTRAM) 50 mg tablet      ??? traMADoL (ULTRAM) 50 mg tablet tramadol 50 mg tablet   Take 1 tablet every 6-8 hours by oral route.       No current facility-administered medications for this visit.        Changes to medications: Gabapentin d/c'd. Started Lyrica. Amlodipine decreased to 5mg  daily    Allergies  Allergen Reactions   ??? Iodine And Iodide Containing Products Anaphylaxis   ??? Statins-Hmg-Coa Reductase Inhibitors      Side effects   ??? Sulfa (Sulfonamide Antibiotics)    ??? Oxycodone Nausea Only     Makes her feel weird       Changes to allergies: No    SPECIALTY MEDICATION ADHERENCE     Repatha 140 mg/ml: 0 days of medicine on hand     Medication Adherence    Patient reported X missed doses in the last month: 0  Specialty Medication: Repatha 140 mg/mL  Informant: patient          Specialty medication(s) dose(s) confirmed: Regimen is correct and unchanged.     Are there any concerns with adherence? No    Adherence counseling provided? Not needed    CLINICAL MANAGEMENT AND INTERVENTION      Clinical Benefit Assessment:    Do you feel the medicine is effective or helping your condition? Yes    Clinical Benefit counseling provided? Not needed    Adverse Effects Assessment:    Are you experiencing any side effects? No    Are you experiencing difficulty administering your medicine? No    Quality of Life Assessment:    How many days over the past month did your high cholesterol  keep you from your normal activities? For example, brushing your teeth or getting up in the morning. 0    Have you discussed this with your provider? Not needed    Therapy Appropriateness:    Is therapy appropriate? Yes, therapy is appropriate and should be continued    DISEASE/MEDICATION-SPECIFIC INFORMATION      For patients on injectable medications: Patient currently has 0 doses left.  Next injection is scheduled for 06/30/20.    PATIENT SPECIFIC NEEDS     - Does the patient have any physical, cognitive, or cultural barriers? No    - Is the patient high risk? No    - Does the patient require a Care Management Plan? No     - Does the patient require physician intervention or other additional services (i.e. nutrition, smoking cessation, social work)? No      SHIPPING     Specialty Medication(s) to be Shipped:   General Specialty: Repatha    Other medication(s) to be shipped: No additional medications requested for fill at this time     Changes to insurance: No    Delivery Scheduled: Yes, Expected medication delivery date: 06/28/20.     Medication will be delivered via Same Day Courier to the confirmed prescription address in [facility].    The patient will receive a drug information handout for each medication shipped and additional FDA Medication Guides as required.  Verified that patient has previously received a Conservation officer, historic buildings.    All of the patient's questions and concerns have been addressed.    Camillo Flaming   [facility] Specialty Pharmacist

## 2020-06-28 MED FILL — REPATHA SYRINGE 140 MG/ML SUBCUTANEOUS SYRINGE: 84 days supply | Qty: 6 | Fill #1 | Status: AC

## 2020-06-28 MED FILL — REPATHA SYRINGE 140 MG/ML SUBCUTANEOUS SYRINGE: SUBCUTANEOUS | 84 days supply | Qty: 6 | Fill #1

## 2020-07-03 DIAGNOSIS — R1319 Other dysphagia: Principal | ICD-10-CM

## 2020-07-14 DIAGNOSIS — F4310 Post-traumatic stress disorder, unspecified: Principal | ICD-10-CM

## 2020-07-14 DIAGNOSIS — F32A Depression, unspecified depression type: Principal | ICD-10-CM

## 2020-07-14 DIAGNOSIS — F419 Anxiety disorder, unspecified: Principal | ICD-10-CM

## 2020-07-14 MED ORDER — CITALOPRAM 40 MG TABLET
ORAL_TABLET | 3 refills | 0 days
Start: 2020-07-14 — End: ?

## 2020-07-17 MED ORDER — CITALOPRAM 40 MG TABLET
ORAL_TABLET | 3 refills | 0 days | Status: CP
Start: 2020-07-17 — End: ?

## 2020-07-17 NOTE — Unmapped (Signed)
Patient is requesting the following refill  Requested Prescriptions     Pending Prescriptions Disp Refills   ??? citalopram (CELEXA) 40 MG tablet [Pharmacy Med Name: CITALOPRAM HYDROBROMIDE 40 MG TAB] 90 tablet 3     Sig: TAKE (1) TABLET BY MOUTH EVERY DAY       Last OV: 03/29/2020    Next OV: Visit date not found.     Labs: Not applicable this refill

## 2020-07-26 ENCOUNTER — Ambulatory Visit: Admit: 2020-07-26 | Discharge: 2020-07-28 | Payer: MEDICARE

## 2020-08-16 NOTE — Unmapped (Signed)
Addended by: Lance Coon on: 08/16/2020 03:31 PM     Modules accepted: Orders

## 2020-08-25 DIAGNOSIS — G4733 Obstructive sleep apnea (adult) (pediatric): Principal | ICD-10-CM

## 2020-09-01 ENCOUNTER — Other Ambulatory Visit: Payer: Self-pay | Admitting: Acute Care

## 2020-09-01 ENCOUNTER — Other Ambulatory Visit (HOSPITAL_COMMUNITY): Payer: Self-pay | Admitting: Acute Care

## 2020-09-01 DIAGNOSIS — M5440 Lumbago with sciatica, unspecified side: Secondary | ICD-10-CM

## 2020-09-01 DIAGNOSIS — G8929 Other chronic pain: Secondary | ICD-10-CM

## 2020-09-05 ENCOUNTER — Ambulatory Visit
Admit: 2020-09-05 | Discharge: 2020-09-06 | Disposition: A | Discharge status: Home / Self Care | Payer: MEDICARE | Attending: Emergency Medicine

## 2020-09-05 ENCOUNTER — Emergency Department
Admit: 2020-09-05 | Discharge: 2020-09-06 | Disposition: A | Discharge status: Home / Self Care | Payer: MEDICARE | Attending: Emergency Medicine

## 2020-09-05 DIAGNOSIS — Z7982 Long term (current) use of aspirin: Principal | ICD-10-CM

## 2020-09-05 DIAGNOSIS — I110 Hypertensive heart disease with heart failure: Principal | ICD-10-CM

## 2020-09-05 DIAGNOSIS — M6281 Muscle weakness (generalized): Principal | ICD-10-CM

## 2020-09-05 DIAGNOSIS — R42 Dizziness and giddiness: Principal | ICD-10-CM

## 2020-09-05 DIAGNOSIS — Z79899 Other long term (current) drug therapy: Principal | ICD-10-CM

## 2020-09-05 DIAGNOSIS — M797 Fibromyalgia: Principal | ICD-10-CM

## 2020-09-05 DIAGNOSIS — R059 Cough, unspecified: Principal | ICD-10-CM

## 2020-09-05 DIAGNOSIS — I252 Old myocardial infarction: Principal | ICD-10-CM

## 2020-09-05 DIAGNOSIS — I5032 Chronic diastolic (congestive) heart failure: Principal | ICD-10-CM

## 2020-09-05 DIAGNOSIS — Z20822 Contact with and (suspected) exposure to covid-19: Principal | ICD-10-CM

## 2020-09-05 DIAGNOSIS — I2510 Atherosclerotic heart disease of native coronary artery without angina pectoris: Principal | ICD-10-CM

## 2020-09-05 DIAGNOSIS — F419 Anxiety disorder, unspecified: Principal | ICD-10-CM

## 2020-09-05 DIAGNOSIS — E785 Hyperlipidemia, unspecified: Principal | ICD-10-CM

## 2020-09-05 DIAGNOSIS — M62838 Other muscle spasm: Principal | ICD-10-CM

## 2020-09-05 DIAGNOSIS — Z955 Presence of coronary angioplasty implant and graft: Principal | ICD-10-CM

## 2020-09-05 DIAGNOSIS — F17210 Nicotine dependence, cigarettes, uncomplicated: Principal | ICD-10-CM

## 2020-09-05 DIAGNOSIS — E669 Obesity, unspecified: Principal | ICD-10-CM

## 2020-09-05 DIAGNOSIS — Z885 Allergy status to narcotic agent status: Principal | ICD-10-CM

## 2020-09-05 DIAGNOSIS — J449 Chronic obstructive pulmonary disease, unspecified: Principal | ICD-10-CM

## 2020-09-05 DIAGNOSIS — R197 Diarrhea, unspecified: Principal | ICD-10-CM

## 2020-09-05 DIAGNOSIS — Z882 Allergy status to sulfonamides status: Principal | ICD-10-CM

## 2020-09-05 DIAGNOSIS — R4781 Slurred speech: Principal | ICD-10-CM

## 2020-09-05 LAB — CBC W/ AUTO DIFF
BASOPHILS ABSOLUTE COUNT: 0 10*9/L (ref 0.0–0.1)
BASOPHILS RELATIVE PERCENT: 0.5 %
EOSINOPHILS ABSOLUTE COUNT: 0.4 10*9/L (ref 0.0–0.7)
EOSINOPHILS RELATIVE PERCENT: 4.4 %
HEMATOCRIT: 36.2 % (ref 35.0–44.0)
HEMOGLOBIN: 12.1 g/dL (ref 12.0–15.5)
LYMPHOCYTES ABSOLUTE COUNT: 2.7 10*9/L (ref 0.7–4.0)
LYMPHOCYTES RELATIVE PERCENT: 33.5 %
MEAN CORPUSCULAR HEMOGLOBIN CONC: 33.5 g/dL (ref 30.0–36.0)
MEAN CORPUSCULAR HEMOGLOBIN: 28.5 pg (ref 26.0–34.0)
MEAN CORPUSCULAR VOLUME: 85 fL (ref 82.0–98.0)
MEAN PLATELET VOLUME: 7.7 fL (ref 7.0–10.0)
MONOCYTES ABSOLUTE COUNT: 0.6 10*9/L (ref 0.1–1.0)
MONOCYTES RELATIVE PERCENT: 6.9 %
NEUTROPHILS ABSOLUTE COUNT: 4.4 10*9/L (ref 1.7–7.7)
NEUTROPHILS RELATIVE PERCENT: 54.7 %
NUCLEATED RED BLOOD CELLS: 0 /100{WBCs} (ref <=4)
PLATELET COUNT: 289 10*9/L (ref 150–450)
RED BLOOD CELL COUNT: 4.26 10*12/L (ref 3.90–5.03)
RED CELL DISTRIBUTION WIDTH: 14.5 % (ref 12.0–15.0)
WBC ADJUSTED: 8.1 10*9/L (ref 3.5–10.5)

## 2020-09-05 LAB — COMPREHENSIVE METABOLIC PANEL
ALBUMIN: 3.3 g/dL — ABNORMAL LOW (ref 3.4–5.0)
ALKALINE PHOSPHATASE: 113 U/L (ref 46–116)
ALT (SGPT): 32 U/L (ref 10–49)
ANION GAP: 6 mmol/L (ref 5–14)
AST (SGOT): 22 U/L (ref <=34)
BILIRUBIN TOTAL: 0.3 mg/dL (ref 0.3–1.2)
BLOOD UREA NITROGEN: 13 mg/dL (ref 9–23)
BUN / CREAT RATIO: 17
CALCIUM: 9.4 mg/dL (ref 8.7–10.4)
CHLORIDE: 104 mmol/L (ref 98–107)
CO2: 28.8 mmol/L (ref 20.0–31.0)
CREATININE: 0.76 mg/dL
EGFR CKD-EPI AA FEMALE: >90 mL/min/{1.73_m2} (ref >=60)
EGFR CKD-EPI NON-AA FEMALE: 84 mL/min/{1.73_m2} (ref >=60)
GLUCOSE RANDOM: 85 mg/dL (ref 70–179)
POTASSIUM: 4 mmol/L (ref 3.4–4.5)
PROTEIN TOTAL: 7.6 g/dL (ref 5.7–8.2)
SODIUM: 139 mmol/L (ref 135–145)

## 2020-09-05 LAB — MAGNESIUM: MAGNESIUM: 1.8 mg/dL (ref 1.6–2.6)

## 2020-09-05 LAB — GREEN LITHIUM HEPARIN EXTRA TUBE

## 2020-09-05 LAB — HIGH SENSITIVITY TROPONIN I - SINGLE: HIGH SENSITIVITY TROPONIN I: <3 ng/L (ref <=34)

## 2020-09-05 LAB — TSH: THYROID STIMULATING HORMONE: 3.358 u[IU]/mL (ref 0.550–4.780)

## 2020-09-06 NOTE — Unmapped (Signed)
Patient presents to ED with dizziness, slurring words, happens 4 hours after she takes her Cymbalta, hydrazine, Abilify, lyrica. Pharmacy told her to come to the ED to be checked for serotonin syndrome
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
## 2020-09-14 MED ORDER — AMLODIPINE 10 MG TABLET
ORAL_TABLET | 0 refills | 0 days
Start: 2020-09-14 — End: ?

## 2020-09-15 NOTE — Unmapped (Signed)
Patient is requesting the following refill  Requested Prescriptions     Signed Prescriptions Disp Refills   ??? amLODIPine (NORVASC) 10 MG tablet 90 tablet 2     Sig: Take 1 tablet (10 mg total) by mouth daily.     Authorizing Provider: Desmond Dike     Ordering User: Janya Eveland A       Last OV: 03/29/2020    Next OV: Visit date not found.     Labs: Not applicable this refill

## 2020-09-18 NOTE — Unmapped (Signed)
[facility] Refill Coordination Note    Specialty Medication(s) to be Shipped:   General Specialty: Repatha    Other medication(s) to be shipped: No additional medications requested for fill at this time     Marie Quinn, DOB: 22-Sep-1956  Phone: [phone] (home)       All above HIPAA information was verified with patient.     Was a Nurse, learning disability used for this call? No    Completed refill call assessment today to schedule patient's medication shipment from the [facility] [phone]).       Specialty medication(s) and dose(s) confirmed: Regimen is correct and unchanged.   Changes to medications: Camry reports no changes at this time.  Changes to insurance: No  Questions for the pharmacist: No    Confirmed patient received Welcome Packet with first shipment. The patient will receive a drug information handout for each medication shipped and additional FDA Medication Guides as required.       DISEASE/MEDICATION-SPECIFIC INFORMATION        For patients on injectable medications: Patient currently has 0 doses left.  Next injection is scheduled for 09/19/20.    SPECIALTY MEDICATION ADHERENCE     Medication Adherence    Patient reported X missed doses in the last month: 0  Specialty Medication: Repatha 140mg /ml  Patient is on additional specialty medications: No  Patient is on more than two specialty medications: No                Repatha 140 mg/ml: 0 days of medicine on hand          SHIPPING     Shipping address confirmed in Epic.     Delivery Scheduled: Yes, Expected medication delivery date: 09/19/20.     Medication will be delivered via Same Day Courier to the prescription address in Epic WAM.    Nancy Nordmann [facility] Specialty Technician

## 2020-09-19 MED FILL — REPATHA SYRINGE 140 MG/ML SUBCUTANEOUS SYRINGE: SUBCUTANEOUS | 84 days supply | Qty: 6 | Fill #2

## 2020-09-19 MED FILL — REPATHA SYRINGE 140 MG/ML SUBCUTANEOUS SYRINGE: 84 days supply | Qty: 6 | Fill #2 | Status: AC

## 2020-09-21 ENCOUNTER — Other Ambulatory Visit: Payer: Self-pay

## 2020-09-21 ENCOUNTER — Ambulatory Visit
Admission: RE | Admit: 2020-09-21 | Discharge: 2020-09-21 | Disposition: A | Discharge status: Home / Self Care | Payer: Medicare HMO | Source: Ambulatory Visit | Attending: Acute Care | Admitting: Acute Care

## 2020-09-21 DIAGNOSIS — G8929 Other chronic pain: Secondary | ICD-10-CM | POA: Diagnosis present

## 2020-09-21 DIAGNOSIS — M5440 Lumbago with sciatica, unspecified side: Secondary | ICD-10-CM | POA: Diagnosis not present

## 2020-10-25 DIAGNOSIS — R1013 Epigastric pain: Principal | ICD-10-CM

## 2020-10-25 MED ORDER — OMEPRAZOLE 20 MG CAPSULE,DELAYED RELEASE
ORAL_CAPSULE | Freq: Every day | ORAL | 1 refills | 180 days | Status: CP
Start: 2020-10-25 — End: 2020-10-26

## 2020-10-26 DIAGNOSIS — R1013 Epigastric pain: Principal | ICD-10-CM

## 2020-10-26 MED ORDER — OMEPRAZOLE 20 MG CAPSULE,DELAYED RELEASE
ORAL_CAPSULE | Freq: Two times a day (BID) | ORAL | 1 refills | 90.00000 days | Status: CP
Start: 2020-10-26 — End: ?

## 2020-10-26 NOTE — Unmapped (Signed)
[facility] called for clarification on the omprazole rx, asking if it should be taken once daily or twice daily since the number ordered was 180 for three months.    I have corrected the rx to read twice daily and am resending now.

## 2020-10-26 NOTE — Unmapped (Incomplete)
Patient is requesting the following refill  Requested Prescriptions     Pending Prescriptions Disp Refills   ??? omeprazole (PRILOSEC) 20 MG capsule [Pharmacy Med Name: OMEPRAZOLE DR 20 MG CAP] 180 capsule 1     Sig: Take 1 capsule (20 mg total) by mouth.       Last OV: 03/29/2020     Last Virtual Visit: Visit date not found     Next OV: Visit date not found.     Labs: Not applicable this refill

## 2020-12-11 ENCOUNTER — Other Ambulatory Visit: Payer: Self-pay

## 2020-12-11 ENCOUNTER — Ambulatory Visit
Admission: EM | Admit: 2020-12-11 | Discharge: 2020-12-11 | Discharge status: Against Medical Advice | Payer: Medicare Other

## 2020-12-11 ENCOUNTER — Ambulatory Visit
Admission: EM | Admit: 2020-12-11 | Discharge: 2020-12-11 | Disposition: A | Discharge status: Home / Self Care | Payer: Medicare Other | Attending: Sports Medicine | Admitting: Sports Medicine

## 2020-12-11 DIAGNOSIS — Z1152 Encounter for screening for COVID-19: Secondary | ICD-10-CM

## 2020-12-11 DIAGNOSIS — J029 Acute pharyngitis, unspecified: Secondary | ICD-10-CM

## 2020-12-11 DIAGNOSIS — R059 Cough, unspecified: Secondary | ICD-10-CM | POA: Diagnosis present

## 2020-12-11 DIAGNOSIS — Z20822 Contact with and (suspected) exposure to covid-19: Secondary | ICD-10-CM | POA: Insufficient documentation

## 2020-12-11 MED ORDER — AZITHROMYCIN 250 MG PO TABS: 250.0000 mg | ORAL_TABLET | Freq: Every day | ORAL | 0 refills | Status: DC

## 2020-12-11 NOTE — Discharge Instructions (Signed)
Please take antibiotics as prescribed. I gave you educational handouts. Over-the-counter meds as needed. Tylenol or Motrin for fever or discomfort. Your Covid test is pending at time of discharge.  Someone will contact you if it is positive.  They may not necessarily contact you if it is negative.  Please check Mychart and follow along for your test results. If your symptoms worsen or do not improve, please see your primary care physician.  I hope you get to feeling better, Dr. Zachery Dauer

## 2020-12-11 NOTE — ED Triage Notes (Signed)
Patient presents to Urgent Care with complaints of cough, sore throat x 4 days. Treating discomfort with OTC cold/cough med and salt/water gargle.   Denies fever, abdominal pain, n/v, or diarrhea.

## 2020-12-12 ENCOUNTER — Emergency Department: Payer: Medicare Other

## 2020-12-12 ENCOUNTER — Emergency Department
Admission: EM | Admit: 2020-12-12 | Discharge: 2020-12-12 | Disposition: A | Discharge status: Home / Self Care | Payer: Medicare Other | Attending: Emergency Medicine | Admitting: Emergency Medicine

## 2020-12-12 ENCOUNTER — Other Ambulatory Visit: Payer: Self-pay

## 2020-12-12 DIAGNOSIS — E119 Type 2 diabetes mellitus without complications: Secondary | ICD-10-CM | POA: Diagnosis not present

## 2020-12-12 DIAGNOSIS — J45909 Unspecified asthma, uncomplicated: Secondary | ICD-10-CM | POA: Insufficient documentation

## 2020-12-12 DIAGNOSIS — J069 Acute upper respiratory infection, unspecified: Secondary | ICD-10-CM | POA: Insufficient documentation

## 2020-12-12 DIAGNOSIS — I10 Essential (primary) hypertension: Secondary | ICD-10-CM | POA: Insufficient documentation

## 2020-12-12 DIAGNOSIS — R0789 Other chest pain: Secondary | ICD-10-CM | POA: Diagnosis present

## 2020-12-12 DIAGNOSIS — Z79899 Other long term (current) drug therapy: Secondary | ICD-10-CM | POA: Diagnosis not present

## 2020-12-12 DIAGNOSIS — F17200 Nicotine dependence, unspecified, uncomplicated: Secondary | ICD-10-CM | POA: Insufficient documentation

## 2020-12-12 DIAGNOSIS — I2510 Atherosclerotic heart disease of native coronary artery without angina pectoris: Secondary | ICD-10-CM | POA: Insufficient documentation

## 2020-12-12 DIAGNOSIS — R079 Chest pain, unspecified: Secondary | ICD-10-CM

## 2020-12-12 LAB — BASIC METABOLIC PANEL
Anion gap: 8 (ref 5–15)
BUN: 17 mg/dL (ref 8–23)
CO2: 26 mmol/L (ref 22–32)
Calcium: 8.5 mg/dL — ABNORMAL LOW (ref 8.9–10.3)
Chloride: 104 mmol/L (ref 98–111)
Creatinine, Ser: 0.68 mg/dL (ref 0.44–1.00)
GFR, Estimated: >60 mL/min (ref >60)
Glucose, Bld: 114 mg/dL — ABNORMAL HIGH (ref 70–99)
Potassium: 3.8 mmol/L (ref 3.5–5.1)
Sodium: 138 mmol/L (ref 135–145)

## 2020-12-12 LAB — TROPONIN I (HIGH SENSITIVITY)
Troponin I (High Sensitivity): 6 ng/L (ref <18)
Troponin I (High Sensitivity): 6 ng/L (ref <18)

## 2020-12-12 LAB — CBC
HCT: 35.5 % — ABNORMAL LOW (ref 36.0–46.0)
Hemoglobin: 11.4 g/dL — ABNORMAL LOW (ref 12.0–15.0)
MCH: 27.4 pg (ref 26.0–34.0)
MCHC: 32.1 g/dL (ref 30.0–36.0)
MCV: 85.3 fL (ref 80.0–100.0)
Platelets: 283 10*3/uL (ref 150–400)
RBC: 4.16 MIL/uL (ref 3.87–5.11)
RDW: 14.4 % (ref 11.5–15.5)
WBC: 9 10*3/uL (ref 4.0–10.5)
nRBC: 0 % (ref 0.0–0.2)

## 2020-12-12 LAB — D-DIMER, QUANTITATIVE: D-Dimer, Quant: 0.46 ug/mL-FEU (ref 0.00–0.50)

## 2020-12-12 LAB — SARS CORONAVIRUS 2 (TAT 6-24 HRS): SARS Coronavirus 2: NEGATIVE

## 2020-12-12 NOTE — ED Triage Notes (Signed)
Pt has taken 4 aspirin, 1 nitro paste on at this time, 2 sub ling nitro. Pt arrives via EMS from home with c/o substernal CP that started about an hour ago.  20 g at left hand Pt getting fluids. HR-80. BP-130/ 98%RA

## 2020-12-12 NOTE — ED Notes (Signed)
IV catheter removed intact without complication.  D/C instructions given.  All questions addressed.  Understanding verbalized.   Pt left ER via w/c, family/friend to pick up.

## 2020-12-12 NOTE — ED Provider Notes (Addendum)
[facility] Emergency Department Provider Note  ____________________________________________   Event Date/Time   First MD Initiated Contact with Patient 12/12/20 1948     (approximate)  I have reviewed the triage vital signs and the nursing notes.   HISTORY  Chief Complaint Chest Pain   HPI Sabrina Lucas is a 65 y.o. female with a past medical history of CAD status post MI with stent placement and coronary artery several years ago, GERD,, HTN, DM, anxiety, asthma, depression and fibromyalgia who presents for assessment of some substernal chest tightness that began approximately 1 hour prior to arrival she was at urgent care where she was being assessed for 4 to 5 days of cough congestion and sore throat.  Patient states she took 324 mg of ASA and 2 sublingual nitroglycerin and had Nitropaste placed in her chest prior to arrival which seemed to help her chest tightness little bit.  She states it is now much better than it began.  She states she is worried about heart attack.  She denies any headache and earache, hemoptysis, abdominal pain, back pain, nausea, vomiting, diarrhea, dysuria, rash, extremity pain weakness numbness or tingling any recent falls or injuries.  Denies tobacco abuse no significant NSAID use.         History reviewed. No pertinent past medical history.  Patient Active Problem List   Diagnosis Date Noted  . Urge incontinence 08/13/2018  . Anxiety 07/30/2018  . Asthma 07/30/2018  . Depression 07/30/2018  . Fibromyalgia 07/30/2018  . Heart disease 07/30/2018  . Panic disorder 07/30/2018  . Post traumatic stress disorder (PTSD) 07/30/2018  . Hypertension 07/30/2018  . Chest pain 08/07/2012  . Myocardial infarction (HCC) 10/14/2006    Past Surgical History:  Procedure Laterality Date  . ABDOMINAL HYSTERECTOMY     partial  . CARDIAC SURGERY    . CHOLECYSTECTOMY    . TONSILLECTOMY      Prior to Admission medications   Medication  Sig Start Date End Date Taking? Authorizing Provider  atorvastatin (LIPITOR) 40 MG tablet  07/28/18   [provider]  azithromycin (ZITHROMAX) 250 MG tablet Take 1 tablet (250 mg total) by mouth daily. Take first 2 tablets together, then 1 every day until finished. 12/11/20   Delton See, MD  Baclofen 5 MG TABS Take by mouth. 08/13/18   [provider]  citalopram (CELEXA) 40 MG tablet  07/30/18   [provider]  hydrOXYzine (ATARAX/VISTARIL) 25 MG tablet  07/30/18   [provider]  metoprolol succinate (TOPROL-XL) 100 MG 24 hr tablet 100 mg.  07/30/18   [provider]  omeprazole (PRILOSEC) 20 MG capsule  07/30/18   [provider]  orphenadrine (NORFLEX) 100 MG tablet Take 1 tablet (100 mg total) by mouth 2 (two) times daily as needed for muscle spasms. 08/16/18   Sudie Grumbling, NP    Allergies Iodine and Sulfa antibiotics  Family History  Problem Relation Age of Onset  . Diabetes Mother   . Heart disease Father     Social History Social History   Tobacco Use  . Smoking status: Current Every Day Smoker  . Smokeless tobacco: Never Used  Vaping Use  . Vaping Use: Former  Substance Use Topics  . Alcohol use: Never    Review of Systems  Review of Systems  Constitutional: Negative for chills and fever.  HENT: Negative for sore throat.   Eyes: Negative for pain.  Respiratory: Negative for cough and stridor.  Cardiovascular: Positive for chest pain.  Gastrointestinal: Negative for vomiting.  Skin: Negative for rash.  Neurological: Negative for seizures, loss of consciousness and headaches.  Psychiatric/Behavioral: Negative for suicidal ideas.  All other systems reviewed and are negative.     ____________________________________________   PHYSICAL EXAM:  VITAL SIGNS: ED Triage Vitals  Enc Vitals Group     BP 12/12/20 1643 (!) 148/91     Pulse Rate 12/12/20 1643 86     Resp 12/12/20 1643 17     Temp  12/12/20 1643 98.4 F (36.9 C)     Temp src --      SpO2 12/12/20 1643 94 %     Weight --      Height --      Head Circumference --      Peak Flow --      Pain Score 12/12/20 1638 8     Pain Loc --      Pain Edu? --      Excl. in GC? --    Vitals:   12/12/20 2015 12/12/20 2142  BP: 138/75 138/75  Pulse: 82 82  Resp: 18 18  Temp:  98.4 F (36.9 C)  SpO2: 96% 96%   Physical Exam Vitals and nursing note reviewed.  Constitutional:      General: She is not in acute distress.    Appearance: She is well-developed and well-nourished.  HENT:     Head: Normocephalic and atraumatic.     Right Ear: External ear normal.     Left Ear: External ear normal.     Nose: Nose normal.     Mouth/Throat:     Mouth: Mucous membranes are moist.     Pharynx: Posterior oropharyngeal erythema present. No oropharyngeal exudate.  Eyes:     Conjunctiva/sclera: Conjunctivae normal.  Cardiovascular:     Rate and Rhythm: Normal rate and regular rhythm.     Heart sounds: No murmur heard.   Pulmonary:     Effort: Pulmonary effort is normal. No respiratory distress.     Breath sounds: Normal breath sounds.  Abdominal:     Palpations: Abdomen is soft.     Tenderness: There is no abdominal tenderness.  Musculoskeletal:        General: No edema.     Cervical back: Neck supple.     Right lower leg: No edema.     Left lower leg: No edema.  Skin:    General: Skin is warm and dry.     Capillary Refill: Capillary refill takes less than 2 seconds.  Neurological:     Mental Status: She is alert and oriented to person, place, and time.  Psychiatric:        Mood and Affect: Mood and affect and mood normal.      ____________________________________________   LABS (all labs ordered are listed, but only abnormal results are displayed)  Labs Reviewed  BASIC METABOLIC PANEL - Abnormal; Notable for the following components:      Result Value   Glucose, Bld 114 (*)    Calcium 8.5 (*)    All other  components within normal limits  CBC - Abnormal; Notable for the following components:   Hemoglobin 11.4 (*)    HCT 35.5 (*)    All other components within normal limits  D-DIMER, QUANTITATIVE  TROPONIN I (HIGH SENSITIVITY)  TROPONIN I (HIGH SENSITIVITY)   ____________________________________________  EKG  Sinus rhythm with a ventricular rate of 82, normal axis, unremarkable intervals and  nonspecific ST change in lead III without any other clear evidence of acute ischemia or other significant underlying arrhythmia. ____________________________________________  RADIOLOGY  ED MD interpretation: No focal consolidation, large effusion, significant edema or any other clear acute intrathoracic process.   Official radiology report(s): DG Chest 2 View  Result Date: 12/12/2020 CLINICAL DATA:  Substernal chest pain. EXAM: CHEST - 2 VIEW COMPARISON:  April 06, 2014 FINDINGS: Mildly decreased lung volumes are seen which is likely secondary to the degree of patient inspiration. The heart size and mediastinal contours are within normal limits. Both lungs are clear. The visualized skeletal structures are unremarkable. IMPRESSION: No active cardiopulmonary disease. Electronically Signed   By: Aram Candela M.D.   On: 12/12/2020 17:06    ____________________________________________   PROCEDURES  Procedure(s) performed (including Critical Care):  .1-3 Lead EKG Interpretation Performed by: Gilles Chiquito, MD Authorized by: Gilles Chiquito, MD     Interpretation: normal     ECG rate assessment: normal     Rhythm: sinus rhythm     Ectopy: none     Conduction: normal       ____________________________________________   INITIAL IMPRESSION / ASSESSMENT AND PLAN / ED COURSE      Patient presents with post history exam for assessment of some chest tightness that began a couple hours ago at urgent care where she was being assessed for complaints of cough congestion sore throat.   Patient states she was told she likely has bronchitis.  She did take above-noted medications prior to arrival.  On arrival she is hypertensive otherwise stable vital signs on room air.  Differential includes but is not limited to arrhythmia, PE, ACS, myocarditis, pericarditis, pleurisy, pneumonia, viral bronchitis and possible esophageal etiologies including esophageal spasm.  Suspicion for ACS given reassuring EKG and to nonelevated troponins obtained over 2 hours.  No evidence of significant arrhythmia.  Chest x-ray shows no evidence of pneumonia pneumothorax effusion or any other clear acute intrathoracic process.  BMP shows no significant electrolyte or metabolic derangements.  CBC shows no leukocytosis or evidence of severe anemia.  D-dimer to assess for risk for PE.  Patient states she had Covid test 2 days ago that was negative and a lower suspicion for this at this time.  D-dimer is less than 0.5 overall not consistent with PE.  Overall unclear allergy for patient's symptoms although given stable vitals without reassuring exam work-up and patient stating she feels better now after some nitro and aspirin believe she is safe for discharge with plan for outpatient follow-up.  Discharge stable condition.  Strict return cautions advised and discussed.       ____________________________________________   FINAL CLINICAL IMPRESSION(S) / ED DIAGNOSES  Final diagnoses:  Chest pain, unspecified type  Viral URI    Medications - No data to display   ED Discharge Orders    None       Note:  This document was prepared using Dragon voice recognition software and may include unintentional dictation errors.   Gilles Chiquito, MD 12/12/20 2314    Gilles Chiquito, MD 12/12/20 [phone]

## 2020-12-13 NOTE — ED Provider Notes (Signed)
[facility]    CSN: [id number] Arrival date & time: 12/11/20  1720      History   Chief Complaint Chief Complaint  Patient presents with  . Sore Throat  . Cough    HPI Sabrina Lucas is a 65 y.o. female.   Pleasant 65 year old female who presents for evaluation of the above issues.  She reports having URI symptoms for about 4 to 5 days.  Mostly a sore throat and cough.  No fever shakes chills.  No chest pain.  She reports a little bit of shortness of breath but she does have some baseline asthma.  No COPD.  No COVID exposure or COVID history.  She has not been vaccinated.  She has received her flu shot.  No abdominal pain nausea vomiting or diarrhea.  No urinary symptoms.  No red flag signs or symptoms appreciated on history.     History reviewed. No pertinent past medical history.  Patient Active Problem List   Diagnosis Date Noted  . Urge incontinence 08/13/2018  . Anxiety 07/30/2018  . Asthma 07/30/2018  . Depression 07/30/2018  . Fibromyalgia 07/30/2018  . Heart disease 07/30/2018  . Panic disorder 07/30/2018  . Post traumatic stress disorder (PTSD) 07/30/2018  . Hypertension 07/30/2018  . Chest pain 08/07/2012  . Myocardial infarction (HCC) 10/14/2006    Past Surgical History:  Procedure Laterality Date  . ABDOMINAL HYSTERECTOMY     partial  . CARDIAC SURGERY    . CHOLECYSTECTOMY    . TONSILLECTOMY      OB History   No obstetric history on file.      Home Medications    Prior to Admission medications   Medication Sig Start Date End Date Taking? Authorizing Provider  azithromycin (ZITHROMAX) 250 MG tablet Take 1 tablet (250 mg total) by mouth daily. Take first 2 tablets together, then 1 every day until finished. 12/11/20  Yes Delton See, MD  atorvastatin (LIPITOR) 40 MG tablet  07/28/18   [provider]  Baclofen 5 MG TABS Take by mouth. 08/13/18   [provider]  citalopram (CELEXA) 40 MG tablet  07/30/18    [provider]  hydrOXYzine (ATARAX/VISTARIL) 25 MG tablet  07/30/18   [provider]  metoprolol succinate (TOPROL-XL) 100 MG 24 hr tablet 100 mg.  07/30/18   [provider]  omeprazole (PRILOSEC) 20 MG capsule  07/30/18   [provider]  orphenadrine (NORFLEX) 100 MG tablet Take 1 tablet (100 mg total) by mouth 2 (two) times daily as needed for muscle spasms. 08/16/18   Sudie Grumbling, NP    Family History Family History  Problem Relation Age of Onset  . Diabetes Mother   . Heart disease Father     Social History Social History   Tobacco Use  . Smoking status: Current Every Day Smoker  . Smokeless tobacco: Never Used  Vaping Use  . Vaping Use: Former  Substance Use Topics  . Alcohol use: Never     Allergies   Iodine and Sulfa antibiotics   Review of Systems Review of Systems  Constitutional: Negative for activity change, appetite change, chills, diaphoresis, fatigue and fever.  HENT: Positive for sore throat. Negative for congestion, ear discharge, ear pain, postnasal drip, rhinorrhea, sinus pressure, sinus pain and sneezing.   Eyes: Negative.   Respiratory: Positive for cough. Negative for chest tightness, shortness of breath, wheezing and stridor.   Cardiovascular: Negative for chest pain and palpitations.  Gastrointestinal: Negative for abdominal  pain, constipation, diarrhea, nausea and vomiting.  Genitourinary: Negative for dysuria, frequency and urgency.  Musculoskeletal: Negative.   Skin: Negative.   Neurological: Negative for dizziness, syncope, light-headedness, numbness and headaches.  All other systems reviewed and are negative.    Physical Exam Triage Vital Signs ED Triage Vitals  Enc Vitals Group     BP 12/11/20 1750 (!) 152/101     Pulse Rate 12/11/20 1750 94     Resp 12/11/20 1750 16     Temp 12/11/20 1750 98.2 F (36.8 C)     Temp Source 12/11/20 1750 Oral     SpO2 12/11/20 1750 96 %     Weight --       Height --      Head Circumference --      Peak Flow --      Pain Score 12/11/20 1753 8     Pain Loc --      Pain Edu? --      Excl. in GC? --    No data found.  Updated Vital Signs BP (!) 152/101 (BP Location: Left Arm)   Pulse 94   Temp 98.2 F (36.8 C) (Oral)   Resp 16   SpO2 96%   Visual Acuity Right Eye Distance:   Left Eye Distance:   Bilateral Distance:    Right Eye Near:   Left Eye Near:    Bilateral Near:     Physical Exam Vitals and nursing note reviewed.  Constitutional:      General: She is not in acute distress.    Appearance: She is well-developed. She is not ill-appearing, toxic-appearing or diaphoretic.  HENT:     Head: Normocephalic and atraumatic.     Nose: No congestion or rhinorrhea.     Mouth/Throat:     Mouth: Mucous membranes are moist. No oral lesions.     Pharynx: Posterior oropharyngeal erythema present. No pharyngeal swelling, oropharyngeal exudate or uvula swelling.     Tonsils: No tonsillar exudate or tonsillar abscesses. 0 on the right. 0 on the left.  Eyes:     Conjunctiva/sclera: Conjunctivae normal.     Pupils: Pupils are equal, round, and reactive to light.  Cardiovascular:     Rate and Rhythm: Normal rate and regular rhythm.     Heart sounds: Normal heart sounds. No murmur heard. No friction rub. No gallop.   Pulmonary:     Effort: Pulmonary effort is normal. No respiratory distress.     Breath sounds: Normal breath sounds. No stridor. No wheezing, rhonchi or rales.  Musculoskeletal:     Cervical back: Normal range of motion and neck supple.  Lymphadenopathy:     Cervical: Cervical adenopathy present.  Skin:    General: Skin is warm and dry.     Capillary Refill: Capillary refill takes less than 2 seconds.  Neurological:     General: No focal deficit present.     Mental Status: She is alert and oriented to person, place, and time.      UC Treatments / Results  Labs (all labs ordered are listed, but only abnormal  results are displayed) Labs Reviewed  SARS CORONAVIRUS 2 (TAT 6-24 HRS)    EKG   Radiology  Procedures Procedures (including critical care time)  Medications Ordered in UC Medications - No data to display  Initial Impression / Assessment and Plan / UC Course  I have reviewed the triage vital signs and the nursing notes.  Pertinent labs & imaging results that  were available during my care of the patient were reviewed by me and considered in my medical decision making (see chart for details).  Clinical impression: 65 year old female with cough and sore throat.  She does have asthma and has some shortness of breath.  We will treat for atypicals for her cough.  Treatment plan: 1.  The findings and treatment plan were discussed in detail with the patient.  Patient was in agreement. 2.  We will put her on a Z-Pak. 3.  Educational handouts provided. 4.  Supportive care, over-the-counter meds as needed, Tylenol or Motrin for fever discomfort.  Plenty of rest plenty of fluids. 5.  We will obtain a COVID test.  It was pending at the time of discharge.  Patient was encouraged to follow along and MyChart. 6.  If her symptoms were to worsen in any way or she developed any chest pain or her shortness of breath was to become worse I have encouraged her to go to the emergency room.  She voiced verbal understanding. 7.  Work note was offered but she declined. 8.  Follow-up here as needed.    Final Clinical Impressions(s) / UC Diagnoses   Final diagnoses:  Encounter for screening for COVID-19  Cough  Sore throat     Discharge Instructions     Please take antibiotics as prescribed. I gave you educational handouts. Over-the-counter meds as needed. Tylenol or Motrin for fever or discomfort. Your Covid test is pending at time of discharge.  Someone will contact you if it is positive.  They may not necessarily contact you if it is negative.  Please check Mychart and follow along for your  test results. If your symptoms worsen or do not improve, please see your primary care physician.  I hope you get to feeling better, Dr. Zachery Dauer   ED Prescriptions    Medication Sig Dispense Auth. Provider   azithromycin (ZITHROMAX) 250 MG tablet Take 1 tablet (250 mg total) by mouth daily. Take first 2 tablets together, then 1 every day until finished. 6 tablet Delton See, MD     PDMP not reviewed this encounter.   Delton See, MD 12/14/20 [phone]

## 2020-12-18 NOTE — Unmapped (Signed)
[facility] Clinical Assessment & Refill Coordination Note    Marie Quinn, [name]: Sep 26, 1956  Phone: [phone] (home)     All above HIPAA information was verified with patient.     Was a Nurse, learning disability used for this call? No    Specialty Medication(s):   repatha 140mg /ml     Current Outpatient Medications   Medication Sig Dispense Refill   ??? albuterol 2.5 mg /3 mL (0.083 %) nebulizer solution Inhale the contents of 1 vial (2.5 mg total) by nebulization every four (4) hours as needed for wheezing. 25 vial 2   ??? albuterol HFA 90 mcg/actuation inhaler Inhale 2 puffs every four (4) hours as needed for wheezing. 18 g 1   ??? amLODIPine (NORVASC) 10 MG tablet Take 1 tablet (10 mg total) by mouth daily. 90 tablet 2   ??? aspirin (ECOTRIN) 81 MG tablet Take 81 mg by mouth daily.     ??? buPROPion (WELLBUTRIN SR) 150 MG 12 hr tablet Take 1 tablet (150 mg total) by mouth Two (2) times a day. 60 tablet 3   ??? citalopram (CELEXA) 40 MG tablet TAKE (1) TABLET BY MOUTH EVERY DAY 90 tablet 3   ??? empty container (SHARPS CONTAINER) Misc Use as directed. 1 each 3   ??? empty container Misc Use as directed to dispose of injectable medications 1 each 3   ??? evolocumab (REPATHA SYRINGE) 140 mg/mL Syrg Inject the contents of 1 syringe (140 mg) under the skin every fourteen (14) days. 6 mL 3   ??? gabapentin (NEURONTIN) 100 MG capsule  (Patient not taking: Reported on 02/08/2020)     ??? hydrOXYzine (ATARAX) 25 MG tablet Take 1 tablet (25 mg total) by mouth every six (6) hours as needed for anxiety and congestion. 90 tablet 3   ??? losartan (COZAAR) 100 MG tablet TAKE (1) TABLET BY MOUTH EVERY DAY 90 tablet 0   ??? meloxicam (MOBIC) 15 MG tablet      ??? methylPREDNISolone (MEDROL DOSEPACK) 4 mg tablet      ??? metoprolol succinate (TOPROL-XL) 100 MG 24 hr tablet Take 1 tablet (100 mg total) by mouth two (2) times a day. Repeat for elevated BP. 180 tablet 3   ??? omeprazole (PRILOSEC) 20 MG capsule Take 1 capsule (20 mg total) by mouth two (2) times a day. 180 capsule 1   ??? ondansetron (ZOFRAN-ODT) 8 MG disintegrating tablet      ??? peg-electrolyte soln (GOLYTELY) 420 gram SolR Take as directed in the instructions sent to you via your [facility] (Patient not taking: Reported on 03/29/2020) 2 Bottle 0   ??? predniSONE (DELTASONE) 10 mg tablet pack prednisone 10 mg tablets in a dose pack   Take 1 dose pk by oral route.     ??? pregabalin (LYRICA) 25 MG capsule Take 25 mg by mouth Two (2) times a day.     ??? promethazine-codeine (PHENERGAN WITH CODEINE) 6.25-10 mg/5 mL syrup 1-2 tsp po every 6 hours as needed for cough and congestion. 120 mL 0   ??? traMADoL (ULTRAM) 50 mg tablet      ??? traMADoL (ULTRAM) 50 mg tablet tramadol 50 mg tablet   Take 1 tablet every 6-8 hours by oral route.       No current facility-administered medications for this visit.        Changes to medications: patient does not have med list with her but says no recent changes    Allergies   Allergen  Reactions   ??? Iodine And Iodide Containing Products Anaphylaxis   ??? Statins-Hmg-Coa Reductase Inhibitors      Side effects   ??? Sulfa (Sulfonamide Antibiotics)    ??? Oxycodone Nausea Only     Makes her feel weird       Changes to allergies: No    SPECIALTY MEDICATION ADHERENCE     repatha 140mg /ml  : 0 days of medicine on hand       Medication Adherence    Patient reported X missed doses in the last month: 1  Specialty Medication: Repatha 140 mg/mL          Specialty medication(s) dose(s) confirmed: Regimen is correct and unchanged.     Are there any concerns with adherence? Yes: reminded pt to return our calls and call us when give last injection    Adherence counseling provided? Yes: see above    CLINICAL MANAGEMENT AND INTERVENTION      Clinical Benefit Assessment:    Do you feel the medicine is effective or helping your condition? Yes    Clinical Benefit counseling provided? Not needed    Adverse Effects Assessment:    Are you experiencing any side effects? No    Are you experiencing difficulty administering your medicine? No    Quality of Life Assessment:    How many days over the past month did your high cholesterol  keep you from your normal activities? For example, brushing your teeth or getting up in the morning. 0    Have you discussed this with your provider? Not needed    Therapy Appropriateness:    Is therapy appropriate? Yes, therapy is appropriate and should be continued    DISEASE/MEDICATION-SPECIFIC INFORMATION      For patients on injectable medications: Patient currently has 0 doses left.  Next injection is scheduled for was due 2/28. wants delivery 3/9. per lex[employer] since doing twice monthly dosing and will be missed dose by over 7 days, pt should skip this dose and go back to normal dosing window. pt will give injection from this delivery on Mon 3/14. i will message cardio rph in case they want to do anything different. pt aware to do next injection on Mon 3/14 (normal day) unless she hears otherwise from cardio rph.    PATIENT SPECIFIC NEEDS     - Does the patient have any physical, cognitive, or cultural barriers? No    - Is the patient high risk? No    - Does the patient require a Care Management Plan? No     - Does the patient require physician intervention or other additional services (i.e. nutrition, smoking cessation, social work)? No      SHIPPING     Specialty Medication(s) to be Shipped:   repatha 140mg /ml    Other medication(s) to be shipped: No additional medications requested for fill at this time     Changes to insurance: No    Delivery Scheduled: Yes, Expected medication delivery date: 12/20/2020.     Medication will be delivered via UPS to the confirmed prescription address in [facility]    The patient will receive a drug information handout for each medication shipped and additional FDA Medication Guides as required.  Verified that patient has previously received a Conservation officer, historic buildings.    All of the patient's questions and concerns have been addressed.    Thad Ranger   [facility] Specialty Pharmacist

## 2020-12-19 DIAGNOSIS — I2510 Atherosclerotic heart disease of native coronary artery without angina pectoris: Principal | ICD-10-CM

## 2020-12-20 DIAGNOSIS — F32A Depression, unspecified depression type: Principal | ICD-10-CM

## 2020-12-20 DIAGNOSIS — R1013 Epigastric pain: Principal | ICD-10-CM

## 2020-12-20 DIAGNOSIS — I2510 Atherosclerotic heart disease of native coronary artery without angina pectoris: Principal | ICD-10-CM

## 2020-12-20 DIAGNOSIS — F4310 Post-traumatic stress disorder, unspecified: Principal | ICD-10-CM

## 2020-12-20 DIAGNOSIS — I10 Essential (primary) hypertension: Principal | ICD-10-CM

## 2020-12-20 DIAGNOSIS — F17200 Nicotine dependence, unspecified, uncomplicated: Principal | ICD-10-CM

## 2020-12-20 DIAGNOSIS — E785 Hyperlipidemia, unspecified: Principal | ICD-10-CM

## 2020-12-20 DIAGNOSIS — F419 Anxiety disorder, unspecified: Principal | ICD-10-CM

## 2020-12-20 DIAGNOSIS — J4520 Mild intermittent asthma, uncomplicated: Principal | ICD-10-CM

## 2020-12-20 MED ORDER — HYDROXYZINE HCL 25 MG TABLET
ORAL_TABLET | Freq: Four times a day (QID) | ORAL | 3 refills | 23 days | Status: CP | PRN
Start: 2020-12-20 — End: ?

## 2020-12-20 MED ORDER — ALBUTEROL SULFATE HFA 90 MCG/ACTUATION AEROSOL INHALER
RESPIRATORY_TRACT | 1 refills | 17 days | Status: CP | PRN
Start: 2020-12-20 — End: 2021-12-20

## 2020-12-20 MED ORDER — OMEPRAZOLE 20 MG CAPSULE,DELAYED RELEASE
ORAL_CAPSULE | Freq: Two times a day (BID) | ORAL | 1 refills | 90 days | Status: CP
Start: 2020-12-20 — End: ?

## 2020-12-20 MED ORDER — EMPTY CONTAINER
3 refills | 0 days
Start: 2020-12-20 — End: ?

## 2020-12-20 MED ORDER — LOSARTAN 100 MG TABLET
ORAL_TABLET | Freq: Every day | ORAL | 1 refills | 90.00000 days | Status: CP
Start: 2020-12-20 — End: ?

## 2020-12-20 MED ORDER — REPATHA SYRINGE 140 MG/ML SUBCUTANEOUS SYRINGE
SUBCUTANEOUS | 3 refills | 84 days | Status: CP
Start: 2020-12-20 — End: ?
  Filled 2020-12-19: qty 6, 84d supply, fill #3

## 2020-12-20 MED ORDER — BUPROPION HCL SR 150 MG TABLET,12 HR SUSTAINED-RELEASE
ORAL_TABLET | Freq: Two times a day (BID) | ORAL | 3 refills | 30 days | Status: CP
Start: 2020-12-20 — End: ?

## 2020-12-20 MED ORDER — AMLODIPINE 10 MG TABLET
ORAL_TABLET | Freq: Every day | ORAL | 2 refills | 90 days | Status: CP
Start: 2020-12-20 — End: ?

## 2020-12-20 MED ORDER — ALBUTEROL SULFATE 2.5 MG/3 ML (0.083 %) SOLUTION FOR NEBULIZATION
RESPIRATORY_TRACT | 1 refills | 9.00000 days | Status: CP | PRN
Start: 2020-12-20 — End: 2021-12-20

## 2021-01-04 ENCOUNTER — Other Ambulatory Visit: Payer: Self-pay | Admitting: Podiatry

## 2021-01-04 ENCOUNTER — Other Ambulatory Visit (HOSPITAL_COMMUNITY): Payer: Self-pay | Admitting: Podiatry

## 2021-01-04 DIAGNOSIS — M79671 Pain in right foot: Secondary | ICD-10-CM

## 2021-01-04 DIAGNOSIS — S86011A Strain of right Achilles tendon, initial encounter: Secondary | ICD-10-CM

## 2021-01-14 ENCOUNTER — Ambulatory Visit
Admission: RE | Admit: 2021-01-14 | Discharge: 2021-01-14 | Disposition: A | Discharge status: Home / Self Care | Payer: Medicare Other | Source: Ambulatory Visit | Attending: Podiatry | Admitting: Podiatry

## 2021-01-14 ENCOUNTER — Other Ambulatory Visit: Payer: Self-pay

## 2021-01-14 DIAGNOSIS — M79671 Pain in right foot: Secondary | ICD-10-CM | POA: Insufficient documentation

## 2021-01-14 DIAGNOSIS — S86011A Strain of right Achilles tendon, initial encounter: Secondary | ICD-10-CM | POA: Insufficient documentation

## 2021-01-17 ENCOUNTER — Other Ambulatory Visit: Payer: Self-pay | Admitting: Podiatry

## 2021-01-28 ENCOUNTER — Encounter: Payer: Self-pay | Admitting: Emergency Medicine

## 2021-01-28 ENCOUNTER — Ambulatory Visit
Admission: EM | Admit: 2021-01-28 | Discharge: 2021-01-28 | Disposition: A | Discharge status: Home / Self Care | Payer: Medicare Other | Attending: Sports Medicine | Admitting: Sports Medicine

## 2021-01-28 ENCOUNTER — Other Ambulatory Visit: Payer: Self-pay

## 2021-01-28 DIAGNOSIS — R109 Unspecified abdominal pain: Secondary | ICD-10-CM | POA: Diagnosis not present

## 2021-01-28 DIAGNOSIS — R300 Dysuria: Secondary | ICD-10-CM | POA: Diagnosis not present

## 2021-01-28 DIAGNOSIS — M546 Pain in thoracic spine: Secondary | ICD-10-CM

## 2021-01-28 DIAGNOSIS — R350 Frequency of micturition: Secondary | ICD-10-CM | POA: Diagnosis not present

## 2021-01-28 HISTORY — DX: Anxiety disorder, unspecified: F41.9

## 2021-01-28 HISTORY — DX: Essential (primary) hypertension: I10

## 2021-01-28 HISTORY — DX: Depression, unspecified: F32.A

## 2021-01-28 HISTORY — DX: Hyperlipidemia, unspecified: E78.5

## 2021-01-28 HISTORY — DX: Atherosclerotic heart disease of native coronary artery without angina pectoris: I25.10

## 2021-01-28 HISTORY — DX: Fibromyalgia: M79.7

## 2021-01-28 HISTORY — DX: Unspecified asthma, uncomplicated: J45.909

## 2021-01-28 LAB — URINALYSIS, COMPLETE (UACMP) WITH MICROSCOPIC
Bacteria, UA: NONE SEEN
Bilirubin Urine: NEGATIVE
Glucose, UA: NEGATIVE mg/dL
Ketones, ur: NEGATIVE mg/dL
Leukocytes,Ua: NEGATIVE
Nitrite: NEGATIVE
Protein, ur: NEGATIVE mg/dL
Specific Gravity, Urine: 1.02 (ref 1.005–1.030)
pH: 6 (ref 5.0–8.0)

## 2021-01-28 NOTE — Discharge Instructions (Signed)
Please see educational handouts. As we discussed your urine does not show urinary tract infection.  It is possible that you have a tiny kidney stone with trace blood on your urinalysis.  There is no bacteria.  Please flush your system with plenty of water.  If her symptoms worsen please come back here or go to the ER or see your primary care provider.

## 2021-01-28 NOTE — ED Provider Notes (Signed)
[facility]    CSN: [id number] Arrival date & time: 01/28/21  8850      History   Chief Complaint Chief Complaint  Patient presents with  . Flank Pain  . Urinary Frequency    HPI Sabrina Lucas is a 65 y.o. female.   Patient is a pleasant 65 year old female who presents for evaluation of the above issues.  She reports right-sided flank pain for about 4 days.  It comes and goes.  She also has some mild dysuria.  She also has increased urinary frequency.  She is concerned because she has surgery in 4 days to repair her right foot and ankle with gastrocnemius surgery.  She reports that it does not wake her from a sleep.  She denies any fever shakes chills, nausea vomiting or diarrhea.  No abdominal pain.  She is only had 1 other UTI in the past.  She also has a history of pyelonephritis when she was 65 years old.  She did have a history of 1 kidney stone back in the 1980s.  She does have some chronic back issues she is not on any medications for this.  She is currently on disability and does not work outside the home.  Regarding her back pain she denies any accidents trauma falls or twists.  She denies any vaginal discharge or history of STDs.  She denies any chest pain shortness of breath.  No red flag signs or symptoms elicited on history.     Past Medical History:  Diagnosis Date  . Anxiety   . Asthma   . CAD (coronary artery disease)   . Depression   . Fibromyalgia   . Hyperlipidemia   . Hypertension     Patient Active Problem List   Diagnosis Date Noted  . Urge incontinence 08/13/2018  . Anxiety 07/30/2018  . Asthma 07/30/2018  . Depression 07/30/2018  . Fibromyalgia 07/30/2018  . Heart disease 07/30/2018  . Panic disorder 07/30/2018  . Post traumatic stress disorder (PTSD) 07/30/2018  . Hypertension 07/30/2018  . Chest pain 08/07/2012  . Myocardial infarction (HCC) 10/14/2006    Past Surgical History:  Procedure Laterality Date  . ABDOMINAL  HYSTERECTOMY     partial  . CARDIAC SURGERY     stent x 1  . CHOLECYSTECTOMY    . TONSILLECTOMY      OB History   No obstetric history on file.      Home Medications    Prior to Admission medications   Medication Sig Start Date End Date Taking? Authorizing Provider  albuterol (PROVENTIL) (2.5 MG/3ML) 0.083% nebulizer solution Take 2.5 mg by nebulization every 6 (six) hours as needed for wheezing or shortness of breath.   Yes [provider]  albuterol (VENTOLIN HFA) 108 (90 Base) MCG/ACT inhaler Inhale 1-2 puffs into the lungs every 6 (six) hours as needed for wheezing or shortness of breath.   Yes [provider]  amLODipine (NORVASC) 10 MG tablet Take 10 mg by mouth daily. 11/16/20  Yes [provider]  ARIPiprazole (ABILIFY) 2 MG tablet Take 4 mg by mouth daily. 01/11/21  Yes [provider]  aspirin 81 MG EC tablet Take 81 mg by mouth daily.   Yes [provider]  buPROPion (WELLBUTRIN SR) 200 MG 12 hr tablet Take 200 mg by mouth 2 (two) times daily. 10/25/20  Yes [provider]  Cholecalciferol 50 MCG (2000 UT) CAPS Take 6,000 Units by mouth daily. 05/26/20  Yes [provider]  DULoxetine (CYMBALTA) 60 MG capsule Take 60 mg by mouth daily. 01/11/21  Yes [provider]  fexofenadine (ALLEGRA) 180 MG tablet Take 180 mg by mouth daily. 11/14/20 11/14/21 Yes [provider]  glimepiride (AMARYL) 1 MG tablet Take 1 mg by mouth daily before breakfast. 01/11/21  Yes [provider]  hydrOXYzine (ATARAX/VISTARIL) 25 MG tablet Take 25 mg by mouth every 6 (six) hours as needed for anxiety. 07/30/18  Yes [provider]  losartan (COZAAR) 100 MG tablet Take 100 mg by mouth daily. 12/21/20  Yes [provider]  meloxicam (MOBIC) 15 MG tablet Take 15 mg by mouth daily. 01/05/21  Yes [provider]  montelukast (SINGULAIR) 10 MG tablet Take 10 mg by mouth at bedtime. 01/11/21  Yes  [provider]  omeprazole (PRILOSEC) 20 MG capsule Take 20 mg by mouth 2 (two) times daily before a meal. 07/30/18  Yes [provider]  OXYGEN Inhale into the lungs. 2 liters by nasal canula prn   Yes [provider]  potassium chloride (KLOR-CON) 10 MEQ tablet Take 10 mEq by mouth daily. 01/11/21  Yes [provider]  pregabalin (LYRICA) 75 MG capsule Take 75 mg by mouth in the morning, at noon, in the evening, and at bedtime. 01/11/21  Yes [provider]  REPATHA 140 MG/ML SOSY Inject 140 mg into the skin every 14 (fourteen) days. 12/19/20  Yes [provider]  SYMBICORT 160-4.5 MCG/ACT inhaler Inhale 2 puffs into the lungs in the morning and at bedtime. 01/11/21  Yes [provider]  torsemide (DEMADEX) 10 MG tablet Take 10 mg by mouth daily. 01/11/21  Yes [provider]  zinc gluconate 50 MG tablet Take 100 mg by mouth daily.   Yes [provider]  azithromycin (ZITHROMAX) 250 MG tablet Take 1 tablet (250 mg total) by mouth daily. Take first 2 tablets together, then 1 every day until finished. Patient not taking: No sig reported 12/11/20   Delton See, MD  orphenadrine (NORFLEX) 100 MG tablet Take 1 tablet (100 mg total) by mouth 2 (two) times daily as needed for muscle spasms. Patient not taking: No sig reported 08/16/18   Sudie Grumbling, NP    Family History Family History  Problem Relation Age of Onset  . Diabetes Mother   . Hypertension Mother   . Heart disease Father     Social History Social History   Tobacco Use  . Smoking status: Former Smoker    Quit date: 07/30/2020    Years since quitting: 0.4  . Smokeless tobacco: Never Used  Vaping Use  . Vaping Use: Former  . Quit date: 01/28/2018  Substance Use Topics  . Alcohol use: Never  . Drug use: Never     Allergies   Iodine and Sulfa antibiotics   Review of Systems Review of Systems  Constitutional: Negative.  Negative for  activity change, appetite change, chills, diaphoresis, fatigue and fever.  HENT: Negative for congestion, postnasal drip, sinus pressure and sore throat.   Eyes: Negative.  Negative for pain.  Respiratory: Negative.  Negative for cough, chest tightness, shortness of breath, wheezing and stridor.   Cardiovascular: Negative.  Negative for chest pain, palpitations and leg swelling.  Gastrointestinal: Negative.  Negative for abdominal pain, constipation, diarrhea, nausea and vomiting.  Genitourinary: Positive for dysuria, flank pain and frequency. Negative for hematuria, pelvic pain, urgency, vaginal bleeding, vaginal discharge and vaginal pain.  Musculoskeletal: Positive for back pain. Negative for myalgias.  Skin:  Negative.  Negative for color change, pallor, rash and wound.  Neurological: Negative.  Negative for dizziness, syncope, light-headedness and headaches.  Psychiatric/Behavioral: The patient is not nervous/anxious.   All other systems reviewed and are negative.    Physical Exam Triage Vital Signs ED Triage Vitals  Enc Vitals Group     BP 01/28/21 0918 134/68     Pulse Rate 01/28/21 0918 89     Resp 01/28/21 0918 18     Temp 01/28/21 0918 97.9 F (36.6 C)     Temp Source 01/28/21 0918 Oral     SpO2 01/28/21 0918 99 %     Weight 01/28/21 0917 261 lb (118.4 kg)     Height 01/28/21 0917 5\' 7"  (1.702 m)     Head Circumference --      Peak Flow --      Pain Score 01/28/21 0959 3     Pain Loc --      Pain Edu? --      Excl. in GC? --    No data found.  Updated Vital Signs BP 134/68 (BP Location: Left Arm)   Pulse 89   Temp 97.9 F (36.6 C) (Oral)   Resp 18   Ht 5\' 7"  (1.702 m)   Wt 118.4 kg   SpO2 99%   BMI 40.88 kg/m   Visual Acuity Right Eye Distance:   Left Eye Distance:   Bilateral Distance:    Right Eye Near:   Left Eye Near:    Bilateral Near:     Physical Exam   UC Treatments / Results  Labs (all labs ordered are listed, but only abnormal results  are displayed) Labs Reviewed  URINALYSIS, COMPLETE (UACMP) WITH MICROSCOPIC - Abnormal; Notable for the following components:      Result Value   Hgb urine dipstick TRACE (*)    All other components within normal limits    EKG   Radiology No results found.  Procedures Procedures (including critical care time)  Medications Ordered in UC Medications - No data to display  Initial Impression / Assessment and Plan / UC Course  I have reviewed the triage vital signs and the nursing notes.  Pertinent labs & imaging results that were available during my care of the patient were reviewed by me and considered in my medical decision making (see chart for details).  Clinical impression: 4 days of right-sided flank pain with some mild dysuria.  She also has increased urinary frequency.  Treatment plan: 1.  The findings and treatment plan were discussed in detail with the patient.  Patient was in agreement. 2.  We will get a UA.  Abnormal results are above.  She does have trace blood but no nitrites, leukocytes, or bacteria.  No UTI present.  Given the trace blood she may have a small renal stone. 3.  Educational handouts provided. 4.  Did indicate that if her symptoms were to worsen then she should seek out care in the emergency room.  For now no urinary tract infection to treat. 5.  Plenty rest, plenty of fluids, Tylenol or Motrin for any fever or discomfort.  Certainly if she does develop a fever that would be reason for her to go to the ER or see her primary care provider. 6.  Follow-up with her primary care provider if symptoms persist. 7.  Discharge from care at this time.  She was stable on discharge from the urgent care.    Final Clinical Impressions(s) / UC  Diagnoses   Final diagnoses:  Right flank pain  Acute right-sided thoracic back pain  Dysuria  Urinary frequency     Discharge Instructions     Please see educational handouts. As we discussed your urine does not show  urinary tract infection.  It is possible that you have a tiny kidney stone with trace blood on your urinalysis.  There is no bacteria.  Please flush your system with plenty of water.  If her symptoms worsen please come back here or go to the ER or see your primary care provider.    ED Prescriptions    None     PDMP not reviewed this encounter.   Delton See, MD 01/28/21 1021

## 2021-01-28 NOTE — ED Triage Notes (Signed)
Patient in today c/o right flank pain and urinary frequency x 4 days. Patient denies fever. Patient denies any vaginal symptoms.

## 2021-01-31 ENCOUNTER — Other Ambulatory Visit
Admission: RE | Admit: 2021-01-31 | Discharge: 2021-01-31 | Disposition: A | Discharge status: Home / Self Care | Payer: Medicare Other | Source: Ambulatory Visit | Attending: Podiatry | Admitting: Podiatry

## 2021-01-31 ENCOUNTER — Other Ambulatory Visit: Payer: Self-pay

## 2021-01-31 HISTORY — DX: Gastro-esophageal reflux disease without esophagitis: K21.9

## 2021-01-31 HISTORY — DX: Type 2 diabetes mellitus without complications: E11.9

## 2021-01-31 HISTORY — DX: Sleep apnea, unspecified: G47.30

## 2021-01-31 HISTORY — DX: Personal history of urinary calculi: Z87.442

## 2021-01-31 NOTE — Patient Instructions (Addendum)
Your procedure is scheduled on: Thursday February 08, 2021. Report to Day Surgery inside [facility] 2nd floor (stop by admissions desk first before getting on elevator) To find out your arrival time please call [phone] between 1PM - 3PM on Wednesday February 07, 2021.  Remember: Instructions that are not followed completely may result in serious medical risk,  up to and including death, or upon the discretion of your surgeon and anesthesiologist your  surgery may need to be rescheduled.     _X__ 1. Do not eat food after midnight the night before your procedure.                 No chewing gum or hard candies. You may drink clear liquids up to 2 hours                 before you are scheduled to arrive for your surgery- DO not drink clear                 liquids within 2 hours of the start of your surgery.                 Clear Liquids include:  water, G2 or Gatorade Zero (avoid Red/Purple/Blue), Black Coffee or Tea (Do not add                 anything to coffee or tea).  ____2.   Complete the "Ensure Clear Pre-surgery Clear Carbohydrate Drink" provided to you, 2 hours before arrival. **If you are diabetic you will be provided with an alternative drink, Gatorade Zero or G2.  __X__3.  On the morning of surgery brush your teeth with toothpaste and water, you                may rinse your mouth with mouthwash if you wish.  Do not swallow any toothpaste of mouthwash.     _X__ 4.  No Alcohol for 24 hours before or after surgery.   _X__ 5.  Do Not Smoke or use e-cigarettes For 24 Hours Prior to Your Surgery.                 Do not use any chewable tobacco products for at least 6 hours prior to                 Surgery.  _X__  6.  Do not use any recreational drugs (marijuana, cocaine, heroin, ecstasy, MDMA or other)                For at least one week prior to your surgery.  Combination of these drugs with anesthesia                May have life threatening  results.  __X__  7.  Notify your doctor if there is any change in your medical condition      (cold, fever, infections).     Do not wear jewelry, make-up, hairpins, clips or nail polish. Do not wear lotions, powders, or perfumes. You may wear deodorant. Do not shave 48 hours prior to surgery.  Do not bring valuables to the hospital.    [facility] is not responsible for any belongings or valuables.  Contacts, dentures or bridgework may not be worn into surgery. Leave your suitcase in the car. After surgery it may be brought to your room. For patients admitted to the hospital, discharge time is determined by your treatment team.   Patients discharged the day  of surgery will not be allowed to drive home.   Make arrangements for someone to be with you for the first 24 hours of your Same Day Discharge.   __X__ Take these medicines the morning of surgery with A SIP OF WATER:    1. amLODipine (NORVASC) 10 MG   2. ARIPiprazole (ABILIFY) 2 MG   3. buPROPion (WELLBUTRIN SR) 200 MG  4. DULoxetine (CYMBALTA) 60 MG  5. omeprazole (PRILOSEC) 20 MG  6. pregabalin (LYRICA) 75 MG   7. hydrOXYzine (ATARAX/VISTARIL) 25 MG  ____ Fleet Enema (as directed)   __X__ Use CHG Soap (or wipes) as directed  ____ Use Benzoyl Peroxide Gel as instructed  __X__ Use inhalers on the day of surgery  SYMBICORT 160-4.5 MCG/ACT inhaler  albuterol (VENTOLIN HFA) 108 (90 Base) MCG/ACT inhaler ____ Stop metformin 2 days prior to surgery    ____ Take 1/2 of usual insulin dose the night before surgery. No insulin the morning          of surgery.   __X__ Call your PCP, cardiologist, or Pulmonologist if taking Coumadin/Plavix/aspirin and ask when to stop before your surgery.   __X__ One Week prior to surgery- Stop Anti-inflammatories such as Ibuprofen, Aleve, Advil, Motrin, meloxicam (MOBIC), diclofenac, etodolac, ketorolac, Toradol, Daypro, piroxicam, Goody's or BC powders. OK TO USE TYLENOL IF NEEDED   __X__  Stop supplements until after surgery.    __X__ Do not start any herbal supplements before your procedure.   If you have any questions regarding your pre-procedure instructions,  Please call Pre-admit Testing at [phone].

## 2021-02-02 ENCOUNTER — Encounter: Payer: Self-pay | Admitting: Podiatry

## 2021-02-02 NOTE — Progress Notes (Signed)
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 Clinical nursing staff has updated patient's PMH/PSHx, current medication list, and drug allergies/intolerances to ensure comprehensive history available to assist in medical decision making as it pertains to the aforementioned surgical procedure and anticipated anesthetic course.   Clinical Discussion:  Sabrina Lucas is a 65 y.o. female who is submitted for pre-surgical anesthesia review and clearance prior to her undergoing the above procedure. Patient is a Former Smoker (quit 07/2020). Pertinent PMH includes: CAD, MI, G2DD, HTN, HLD, T2DM, DOE (on supplemental O2), OSAH (requires nocturnal PAP therapy), GERD (on daily PPI), asthma, fibromyalgia, anxiety, depression, PTSD.  Patient is followed by cardiology Lady Gary, MD). She was last seen in the cardiology clinic on 01/25/2021; notes reviewed.  At the time of her clinic visit, patient reporting occasional chest tightness, peripheral edema, and exertional shortness of breath.  Patient on supplemental oxygen ATC.  Patient with intermittent vertiginous symptoms.  No PND, orthopnea, palpitations, or  presyncope/syncope.  PMH significant for cardiovascular disease including CAD and a remote MI and 2005.  Myocardial perfusion imaging study performed on 09/17/2018 revealed no evidence of significant ischemia or scar, a globally normal systolic function, and a calculated EF of 63%.  TTE performed on 01/19/2021 revealed normal left ventricular systolic function with mild LVH, mild valvular insufficiency, and mild to moderate biatrial enlargement; LVEF >55% (see full interpretation of cardiovascular testing below).  Patient is on GDMT for HTN and HLD diagnoses.  Blood pressure elevated at 150/100 on currently prescribed CCB, ARB, and diuretic therapies.  Patient is on a statin for her HLD.  T2DM well-controlled on currently prescribed regimen; last hemoglobin A1c 6.7% when checked on 05/25/2020.  Functional capacity limited secondary to podiatric pain and overall respiratory status.  No changes were made to patient's medication regimen.  Patient to follow-up with outpatient cardiology at defined intervals for ongoing care and management.  Patient is scheduled for an elective podiatric procedure on 02/08/2021 with Dr. Rosetta Posner.  Given patient's past medical history significant for cardiovascular diagnoses, presurgical cardiac clearance was sought by the PAT team.  Per cardiology, "patient appears to be optimized from a cardiac standpoint for surgery.  Patient may proceed with planned surgical intervention without any further cardiac work-up with an overall ACCEPTABLE risk for perioperative cardiovascular complications". This patient is on daily antiplatelet therapy. She has been instructed on recommendations for holding her daily low-dose ASA for 5 days prior to her procedure with plans to restart as soon as postoperative bleeding risk felt to be minimized by her attending surgeon. The patient has been instructed that her last dose of  her anticoagulant will be on 02/02/2021.  Patient denies previous  perioperative complications with anesthesia in the past. In review of the available records, it is noted that patient underwent a general anesthetic course at [facility] ([facility]) in 07/2019 without documented complications.   Vitals with BMI 01/31/2021 01/28/2021 12/12/2020  Height 5\' 7"  5\' 7"  -  Weight 262 lbs 261 lbs -  BMI 41.03 40.87 -  Systolic - 134 138  Diastolic - 68 75  Pulse - 89 82    Providers/Specialists:   NOTE: Primary physician provider listed below. Patient may have been seen by APP or partner within same practice.   PROVIDER ROLE / SPECIALTY LAST , DPM  Podiatry (Surgeon)  01/16/2021  [facility]  Primary Care Provider  01/31/2021  [facility], MD  Cardiology  01/25/2021   Allergies:  Iodine, Percocet [oxycodone-acetaminophen], Statins, and Sulfa antibiotics  Current Home Medications:   No current facility-administered medications for this encounter.   Harold Hedge albuterol (PROVENTIL) (2.5 MG/3ML) 0.083% nebulizer solution  . albuterol (VENTOLIN HFA) 108 (90 Base) MCG/ACT inhaler  . amLODipine (NORVASC) 10 MG tablet  . ARIPiprazole (ABILIFY) 2 MG tablet  . aspirin 81 MG EC tablet  . buPROPion (WELLBUTRIN SR) 200 MG 12 hr tablet  . Cholecalciferol 50 MCG (2000 UT) CAPS  . DULoxetine (CYMBALTA) 60 MG capsule  . fexofenadine (ALLEGRA) 180 MG tablet  . glimepiride (AMARYL) 1 MG tablet  . hydrOXYzine (ATARAX/VISTARIL) 25 MG tablet  . losartan (COZAAR) 100 MG tablet  . meloxicam (MOBIC) 15 MG tablet  . montelukast (SINGULAIR) 10 MG tablet  . omeprazole (PRILOSEC) 20 MG capsule  . potassium chloride (KLOR-CON) 10 MEQ tablet  . pregabalin (LYRICA) 75 MG capsule  . REPATHA 140 MG/ML SOSY  . SYMBICORT 160-4.5 MCG/ACT inhaler  . torsemide (DEMADEX) 10 MG tablet  . zinc gluconate 50 MG tablet  . azithromycin (ZITHROMAX) 250 MG tablet  . Cinnamon 500 MG capsule  . Echinacea 400 MG CAPS  . Glucosamine-Chondroitin (GLUCOSAMINE CHONDR COMPLEX  PO)  . orphenadrine (NORFLEX) 100 MG tablet  . OXYGEN   History:   Past Medical History:  Diagnosis Date  . Anxiety   . Asthma   . CAD (coronary artery disease)   . Depression   . Diabetes mellitus without complication (HCC)    Type II  . Fibromyalgia   . GERD (gastroesophageal reflux disease)   . History of kidney stones   . Hyperlipidemia   . Hypertension   . Myocardial infarction (HCC) 2005  . Pancreatitis 2020  . Sleep apnea    Past Surgical History:  Procedure Laterality Date  . ABDOMINAL HYSTERECTOMY     partial  . CARDIAC SURGERY  2005   stent x 1  . CHOLECYSTECTOMY  2016  . MOUTH SURGERY  2003  . pancreas stent  06/2019  . TONSILLECTOMY  1983   Family History  Problem Relation Age of Onset  . Diabetes Mother   . Hypertension Mother   . Heart disease Father    Social History   Tobacco Use  . Smoking status: Former Smoker    Quit date: 07/30/2020    Years since quitting: 0.5  . Smokeless tobacco: Never Used  Vaping Use  . Vaping Use: Former  . Quit date: 01/28/2018  Substance Use Topics  . Alcohol use: Never  . Drug use: Never    Pertinent Clinical Results:  LABS: Labs reviewed: Acceptable for surgery.  Lab Results  Component  Value Date   WBC 9.0 12/12/2020   HGB 11.4 (L) 12/12/2020   HCT 35.5 (L) 12/12/2020   MCV 85.3 12/12/2020   PLT 283 12/12/2020   Lab Results  Component Value Date   NA 138 12/12/2020   K 3.8 12/12/2020   CO2 26 12/12/2020   BUN 17 12/12/2020   CREATININE 0.68 12/12/2020   CALCIUM 8.5 (L) 12/12/2020   GLUCOSE 114 (H) 12/12/2020   No visits with results within 3 Day(s) from this visit.  Latest known visit with results is:  Admission on 01/28/2021, Discharged on 01/28/2021  Component Date Value Ref Range Status  . Color, Urine 01/28/2021 YELLOW  YELLOW Final  . APPearance 01/28/2021 CLEAR  CLEAR Final  . Specific Gravity, Urine 01/28/2021 1.020  1.005 - 1.030 Final  . pH 01/28/2021 6.0  5.0 - 8.0 Final  .  Glucose, UA 01/28/2021 NEGATIVE  NEGATIVE mg/dL Final  . Hgb urine dipstick 01/28/2021 TRACE* NEGATIVE Final  . Bilirubin Urine 01/28/2021 NEGATIVE  NEGATIVE Final  . Ketones, ur 01/28/2021 NEGATIVE  NEGATIVE mg/dL Final  . Protein, ur 42/59/5638 NEGATIVE  NEGATIVE mg/dL Final  . Nitrite 75/64/3329 NEGATIVE  NEGATIVE Final  . Glori Luis 01/28/2021 NEGATIVE  NEGATIVE Final  . Squamous Epithelial / LPF 01/28/2021 0-5  0 - 5 Final  . WBC, UA 01/28/2021 0-5  0 - 5 WBC/hpf Final  . RBC / HPF 01/28/2021 6-10  0 - 5 RBC/hpf Final  . Bacteria, UA 01/28/2021 NONE SEEN  NONE SEEN Final   Performed at [facility], [address] [place], [place] [address]    ECG: Date: 12/12/2020 Time ECG obtained: 1631 PM Rate: 82 bpm Rhythm: normal sinus Axis (leads I and aVF): Normal Intervals: PR 142 ms. QRS 80 ms. QTc 472 ms. ST segment and T wave changes: Nonspecific ST abnormality Comparison: Similar to previous tracing obtained on 09/05/2020   IMAGING / PROCEDURES: MRI ANKLE RIGHT WITHOUT CONTRAST performed on 01/14/2021 1. Mild insertional Achilles tendinitis with moderate edema in the pre-Achilles fat. 2. No evidence of tendon tear. 3. The additional ankle tendons and ligaments appear intact 4. No acute osseous findings  DIAGNOSTIC RADIOGRAPHS CHEST 2 VIEW performed on 12/12/2020 1. Mildly decreased lung volumes are seen which is likely secondary to the degree of patient aspiration. 2. The heart size and mediastinal contours are within normal limits. 3. Both lungs are clear. 4. Visual skeletal muscles are unremarkable. 5. No active cardiopulmonary disease  ECHOCARDIOGRAM performed on 01/19/2021 1. LVEF >55% 2. Normal left ventricular systolic function with mild LVH 3. Normal right ventricular systolic function 4. Mild LV enlargement 5. Moderate left atrial enlargement mild MR, AR, and TR 6. Trivial PR 7. AVA (VTI) equals 3.3 cm 8. No valvular stenosis 9. No evidence  of pericardial effusion  LEXISCAN performed on 09/17/2018  1. Estimated LVEF 63% 2. Globally normal systolic function 3. No evidence for significant ischemia or scar 4. Normal myocardial perfusion study  Impression and Plan:  Bresha Hosack has been referred for pre-anesthesia review and clearance prior to her undergoing the planned anesthetic and procedural courses. Available labs, pertinent testing, and imaging results were personally reviewed by me. This patient has been appropriately cleared by cardiology with an overall ACCEPTABLE risk of significant perioperative cardiovascular complications.  Based on clinical review performed today (02/02/21), barring any significant acute changes in the patient's overall condition, it is anticipated that she will be able to proceed with the planned surgical intervention. Any acute changes in  clinical condition may necessitate her procedure being postponed and/or cancelled. Patient will meet with anesthesia team (MD and/or CRNA) on the day of her procedure for preoperative evaluation/assessment. Questions regarding anesthetic course will be fielded at that time.   Pre-surgical instructions were reviewed with the patient during her PAT appointment and questions were fielded by PAT clinical staff. Patient was advised that if any questions or concerns arise prior to her procedure then she should return a call to PAT and/or her surgeon's office to discuss.  Quentin MullingBryan Tamla Winkels, MSN, APRN, FNP-C, CEN [facility]  Peri-operative Services Nurse Practitioner Phone: [phone] 02/02/21 9:18 AM  NOTE: This note has been prepared using Dragon dictation software. Despite my best ability to proofread, there is always the potential that unintentional transcriptional errors may still occur from this process.

## 2021-02-06 ENCOUNTER — Other Ambulatory Visit: Payer: Self-pay

## 2021-02-06 ENCOUNTER — Other Ambulatory Visit
Admission: RE | Admit: 2021-02-06 | Discharge: 2021-02-06 | Disposition: A | Discharge status: Home / Self Care | Payer: Medicare Other | Source: Ambulatory Visit | Attending: Podiatry | Admitting: Podiatry

## 2021-02-06 DIAGNOSIS — Z20822 Contact with and (suspected) exposure to covid-19: Secondary | ICD-10-CM | POA: Insufficient documentation

## 2021-02-06 DIAGNOSIS — Z01812 Encounter for preprocedural laboratory examination: Secondary | ICD-10-CM | POA: Insufficient documentation

## 2021-02-07 LAB — SARS CORONAVIRUS 2 (TAT 6-24 HRS): SARS Coronavirus 2: NEGATIVE

## 2021-02-07 MED ORDER — SODIUM CHLORIDE 0.9 % IV SOLN
INTRAVENOUS | Status: DC
  Administered 2021-02-08: 10 mL/h via INTRAVENOUS

## 2021-02-07 MED ORDER — CHLORHEXIDINE GLUCONATE 0.12 % MT SOLN
15.0000 mL | Freq: Once | OROMUCOSAL | Status: AC
Start: 2021-02-07 — End: 2021-02-08

## 2021-02-07 MED ORDER — ORAL CARE MOUTH RINSE
15.0000 mL | Freq: Once | OROMUCOSAL | Status: AC
Start: 2021-02-07 — End: 2021-02-08

## 2021-02-07 MED ORDER — CEFAZOLIN SODIUM-DEXTROSE 2-4 GM/100ML-% IV SOLN
2.0000 g | INTRAVENOUS | Status: AC
  Administered 2021-02-08: 2 g via INTRAVENOUS

## 2021-02-08 ENCOUNTER — Encounter
Admission: RE | Disposition: A | Discharge status: Home / Self Care | Payer: Self-pay | Source: Home / Self Care | Attending: Podiatry

## 2021-02-08 ENCOUNTER — Ambulatory Visit
Admission: RE | Admit: 2021-02-08 | Discharge: 2021-02-08 | Disposition: A | Discharge status: Home / Self Care | Payer: Medicare Other | Attending: Podiatry | Admitting: Podiatry

## 2021-02-08 ENCOUNTER — Ambulatory Visit: Payer: Medicare Other

## 2021-02-08 ENCOUNTER — Encounter: Payer: Self-pay | Admitting: Podiatry

## 2021-02-08 ENCOUNTER — Ambulatory Visit: Payer: Medicare Other | Admitting: Urgent Care

## 2021-02-08 ENCOUNTER — Other Ambulatory Visit: Payer: Self-pay

## 2021-02-08 DIAGNOSIS — Z885 Allergy status to narcotic agent status: Secondary | ICD-10-CM | POA: Diagnosis not present

## 2021-02-08 DIAGNOSIS — M2142 Flat foot [pes planus] (acquired), left foot: Secondary | ICD-10-CM | POA: Insufficient documentation

## 2021-02-08 DIAGNOSIS — Z8249 Family history of ischemic heart disease and other diseases of the circulatory system: Secondary | ICD-10-CM | POA: Insufficient documentation

## 2021-02-08 DIAGNOSIS — Z7984 Long term (current) use of oral hypoglycemic drugs: Secondary | ICD-10-CM | POA: Insufficient documentation

## 2021-02-08 DIAGNOSIS — M7661 Achilles tendinitis, right leg: Secondary | ICD-10-CM | POA: Insufficient documentation

## 2021-02-08 DIAGNOSIS — Z800 Family history of malignant neoplasm of digestive organs: Secondary | ICD-10-CM | POA: Diagnosis not present

## 2021-02-08 DIAGNOSIS — M7731 Calcaneal spur, right foot: Secondary | ICD-10-CM | POA: Insufficient documentation

## 2021-02-08 DIAGNOSIS — Z7951 Long term (current) use of inhaled steroids: Secondary | ICD-10-CM | POA: Diagnosis not present

## 2021-02-08 DIAGNOSIS — Z7982 Long term (current) use of aspirin: Secondary | ICD-10-CM | POA: Insufficient documentation

## 2021-02-08 DIAGNOSIS — E119 Type 2 diabetes mellitus without complications: Secondary | ICD-10-CM | POA: Insufficient documentation

## 2021-02-08 DIAGNOSIS — Z791 Long term (current) use of non-steroidal anti-inflammatories (NSAID): Secondary | ICD-10-CM | POA: Insufficient documentation

## 2021-02-08 DIAGNOSIS — Z888 Allergy status to other drugs, medicaments and biological substances status: Secondary | ICD-10-CM | POA: Diagnosis not present

## 2021-02-08 DIAGNOSIS — Z09 Encounter for follow-up examination after completed treatment for conditions other than malignant neoplasm: Secondary | ICD-10-CM

## 2021-02-08 DIAGNOSIS — Z87891 Personal history of nicotine dependence: Secondary | ICD-10-CM | POA: Insufficient documentation

## 2021-02-08 DIAGNOSIS — G8929 Other chronic pain: Secondary | ICD-10-CM | POA: Diagnosis not present

## 2021-02-08 DIAGNOSIS — Z79899 Other long term (current) drug therapy: Secondary | ICD-10-CM | POA: Diagnosis not present

## 2021-02-08 DIAGNOSIS — Z882 Allergy status to sulfonamides status: Secondary | ICD-10-CM | POA: Insufficient documentation

## 2021-02-08 DIAGNOSIS — M2141 Flat foot [pes planus] (acquired), right foot: Secondary | ICD-10-CM | POA: Insufficient documentation

## 2021-02-08 DIAGNOSIS — M9261 Juvenile osteochondrosis of tarsus, right ankle: Secondary | ICD-10-CM | POA: Insufficient documentation

## 2021-02-08 DIAGNOSIS — Q6601 Congenital talipes equinovarus, right foot: Secondary | ICD-10-CM | POA: Diagnosis present

## 2021-02-08 DIAGNOSIS — Z833 Family history of diabetes mellitus: Secondary | ICD-10-CM | POA: Insufficient documentation

## 2021-02-08 DIAGNOSIS — Z419 Encounter for procedure for purposes other than remedying health state, unspecified: Secondary | ICD-10-CM

## 2021-02-08 HISTORY — DX: Post-traumatic stress disorder, unspecified: F43.10

## 2021-02-08 HISTORY — PX: OSTECTOMY: SHX6439

## 2021-02-08 HISTORY — DX: Dependence on supplemental oxygen: Z99.81

## 2021-02-08 HISTORY — PX: ACHILLES TENDON SURGERY: SHX542

## 2021-02-08 HISTORY — DX: Dyspnea, unspecified: R06.00

## 2021-02-08 HISTORY — PX: GASTROC RECESSION EXTREMITY: SHX6262

## 2021-02-08 HISTORY — DX: Other forms of dyspnea: R06.09

## 2021-02-08 HISTORY — PX: TENDON TRANSFER: SHX6109

## 2021-02-08 HISTORY — DX: Other ill-defined heart diseases: I51.89

## 2021-02-08 LAB — GLUCOSE, CAPILLARY
Glucose-Capillary: 104 mg/dL — ABNORMAL HIGH (ref 70–99)
Glucose-Capillary: 171 mg/dL — ABNORMAL HIGH (ref 70–99)

## 2021-02-08 SURGERY — RECESSION, TENDON, GASTROCNEMIUS
Anesthesia: General | Laterality: Right

## 2021-02-08 MED ORDER — MIDAZOLAM HCL 2 MG/2ML IJ SOLN
INTRAMUSCULAR | Status: DC | PRN
  Administered 2021-02-08: 2 mg via INTRAVENOUS

## 2021-02-08 MED ORDER — SUCCINYLCHOLINE CHLORIDE 20 MG/ML IJ SOLN
INTRAMUSCULAR | Status: DC | PRN
  Administered 2021-02-08: 120 mg via INTRAVENOUS

## 2021-02-08 MED ORDER — ASPIRIN 81 MG PO TBEC: 81.0000 mg | DELAYED_RELEASE_TABLET | Freq: Every day | ORAL | 12 refills | Status: AC

## 2021-02-08 MED ORDER — ONDANSETRON HCL 4 MG/2ML IJ SOLN: 4.0000 mg | Freq: Once | INTRAMUSCULAR | Status: DC | PRN

## 2021-02-08 MED ORDER — FENTANYL CITRATE (PF) 100 MCG/2ML IJ SOLN
INTRAMUSCULAR | Status: DC | PRN
  Administered 2021-02-08 (×2): 50 ug via INTRAVENOUS

## 2021-02-08 MED ORDER — ONDANSETRON HCL 4 MG/2ML IJ SOLN
INTRAMUSCULAR | Status: AC
  Filled 2021-02-08: qty 2

## 2021-02-08 MED ORDER — CHLORHEXIDINE GLUCONATE 0.12 % MT SOLN
OROMUCOSAL | Status: AC
  Administered 2021-02-08: 15 mL via OROMUCOSAL
  Filled 2021-02-08: qty 15

## 2021-02-08 MED ORDER — BUPIVACAINE-EPINEPHRINE (PF) 0.25% -1:200000 IJ SOLN
INTRAMUSCULAR | Status: AC
  Filled 2021-02-08: qty 30

## 2021-02-08 MED ORDER — CEFAZOLIN SODIUM-DEXTROSE 2-4 GM/100ML-% IV SOLN
INTRAVENOUS | Status: AC
  Filled 2021-02-08: qty 100

## 2021-02-08 MED ORDER — BUPIVACAINE LIPOSOME 1.3 % IJ SUSP
INTRAMUSCULAR | Status: AC
  Filled 2021-02-08: qty 20

## 2021-02-08 MED ORDER — ACETAMINOPHEN 10 MG/ML IV SOLN
INTRAVENOUS | Status: DC | PRN
  Administered 2021-02-08: 1000 mg via INTRAVENOUS

## 2021-02-08 MED ORDER — PHENYLEPHRINE HCL (PRESSORS) 10 MG/ML IV SOLN
INTRAVENOUS | Status: AC
  Filled 2021-02-08: qty 1

## 2021-02-08 MED ORDER — FENTANYL CITRATE (PF) 100 MCG/2ML IJ SOLN
25.0000 ug | INTRAMUSCULAR | Status: DC | PRN
  Administered 2021-02-08: 25 ug via INTRAVENOUS

## 2021-02-08 MED ORDER — SUCCINYLCHOLINE CHLORIDE 200 MG/10ML IV SOSY
PREFILLED_SYRINGE | INTRAVENOUS | Status: AC
  Filled 2021-02-08: qty 10

## 2021-02-08 MED ORDER — LIDOCAINE HCL (PF) 1 % IJ SOLN
INTRAMUSCULAR | Status: AC
  Filled 2021-02-08: qty 5

## 2021-02-08 MED ORDER — ROPIVACAINE HCL 5 MG/ML IJ SOLN
INTRAMUSCULAR | Status: DC | PRN
  Administered 2021-02-08: 25 mL via PERINEURAL

## 2021-02-08 MED ORDER — ACETAMINOPHEN 10 MG/ML IV SOLN
INTRAVENOUS | Status: AC
  Filled 2021-02-08: qty 100

## 2021-02-08 MED ORDER — LIDOCAINE HCL (PF) 1 % IJ SOLN
INTRAMUSCULAR | Status: DC | PRN
  Administered 2021-02-08: 3 mL via SUBCUTANEOUS

## 2021-02-08 MED ORDER — ROCURONIUM BROMIDE 100 MG/10ML IV SOLN
INTRAVENOUS | Status: DC | PRN
  Administered 2021-02-08: 40 mg via INTRAVENOUS

## 2021-02-08 MED ORDER — OXYCODONE-ACETAMINOPHEN 7.5-325 MG PO TABS: 1.0000 | ORAL_TABLET | Freq: Four times a day (QID) | ORAL | 0 refills | Status: AC | PRN

## 2021-02-08 MED ORDER — PROPOFOL 10 MG/ML IV BOLUS
INTRAVENOUS | Status: DC | PRN
  Administered 2021-02-08: 150 mg via INTRAVENOUS

## 2021-02-08 MED ORDER — ONDANSETRON HCL 4 MG/2ML IJ SOLN
INTRAMUSCULAR | Status: DC | PRN
  Administered 2021-02-08: 4 mg via INTRAVENOUS

## 2021-02-08 MED ORDER — ENOXAPARIN SODIUM 40 MG/0.4ML IJ SOSY: 40.0000 mg | PREFILLED_SYRINGE | INTRAMUSCULAR | 0 refills | Status: AC

## 2021-02-08 MED ORDER — ENOXAPARIN SODIUM 40 MG/0.4ML IJ SOSY: 40.0000 mg | PREFILLED_SYRINGE | INTRAMUSCULAR | 0 refills | Status: DC

## 2021-02-08 MED ORDER — FENTANYL CITRATE (PF) 100 MCG/2ML IJ SOLN
INTRAMUSCULAR | Status: AC
  Administered 2021-02-08: 25 ug via INTRAVENOUS
  Filled 2021-02-08: qty 2

## 2021-02-08 MED ORDER — DEXMEDETOMIDINE HCL 200 MCG/2ML IV SOLN
INTRAVENOUS | Status: DC | PRN
  Administered 2021-02-08: 12 ug via INTRAVENOUS

## 2021-02-08 MED ORDER — ROPIVACAINE HCL 5 MG/ML IJ SOLN
INTRAMUSCULAR | Status: AC
  Filled 2021-02-08: qty 30

## 2021-02-08 MED ORDER — MIDAZOLAM HCL 2 MG/2ML IJ SOLN
INTRAMUSCULAR | Status: AC
  Filled 2021-02-08: qty 2

## 2021-02-08 MED ORDER — ONDANSETRON HCL 4 MG PO TABS: 4.0000 mg | ORAL_TABLET | Freq: Three times a day (TID) | ORAL | 1 refills | Status: AC | PRN

## 2021-02-08 MED ORDER — ROCURONIUM BROMIDE 10 MG/ML (PF) SYRINGE
PREFILLED_SYRINGE | INTRAVENOUS | Status: AC
  Filled 2021-02-08: qty 10

## 2021-02-08 MED ORDER — PROPOFOL 10 MG/ML IV BOLUS
INTRAVENOUS | Status: AC
  Filled 2021-02-08: qty 20

## 2021-02-08 MED ORDER — BUPIVACAINE LIPOSOME 1.3 % IJ SUSP
INTRAMUSCULAR | Status: DC | PRN
  Administered 2021-02-08: 10 mL

## 2021-02-08 MED ORDER — DEXMEDETOMIDINE (PRECEDEX) IN NS 20 MCG/5ML (4 MCG/ML) IV SYRINGE
PREFILLED_SYRINGE | INTRAVENOUS | Status: AC
  Filled 2021-02-08: qty 5

## 2021-02-08 MED ORDER — DEXAMETHASONE SODIUM PHOSPHATE 10 MG/ML IJ SOLN
INTRAMUSCULAR | Status: DC | PRN
  Administered 2021-02-08: 4 mg via INTRAVENOUS

## 2021-02-08 MED ORDER — DEXAMETHASONE SODIUM PHOSPHATE 10 MG/ML IJ SOLN
INTRAMUSCULAR | Status: AC
  Filled 2021-02-08: qty 1

## 2021-02-08 MED ORDER — BUPIVACAINE HCL (PF) 0.5 % IJ SOLN
INTRAMUSCULAR | Status: AC
  Filled 2021-02-08: qty 30

## 2021-02-08 MED ORDER — FENTANYL CITRATE (PF) 100 MCG/2ML IJ SOLN
INTRAMUSCULAR | Status: AC
  Filled 2021-02-08: qty 2

## 2021-02-08 MED ORDER — PHENYLEPHRINE HCL (PRESSORS) 10 MG/ML IV SOLN
INTRAVENOUS | Status: DC | PRN
  Administered 2021-02-08: 100 ug via INTRAVENOUS
  Administered 2021-02-08 (×2): 50 ug via INTRAVENOUS

## 2021-02-08 MED ORDER — CEPHALEXIN 500 MG PO CAPS: 500.0000 mg | ORAL_CAPSULE | Freq: Three times a day (TID) | ORAL | 0 refills | Status: AC

## 2021-02-08 SURGICAL SUPPLY — 79 items
APL SKNCLS STERI-STRIP NONHPOA (GAUZE/BANDAGES/DRESSINGS) ×1
BASIN GRAD PLASTIC 32OZ STRL (MISCELLANEOUS) ×2 IMPLANT
BENZOIN TINCTURE PRP APPL 2/3 (GAUZE/BANDAGES/DRESSINGS) ×2 IMPLANT
BLADE MED AGGRESSIVE (BLADE) ×2 IMPLANT
BLADE SURG 15 STRL LF DISP TIS (BLADE) IMPLANT
BLADE SURG 15 STRL SS (BLADE)
BLADE SURG MINI STRL (BLADE) ×2 IMPLANT
BNDG CMPR 75X41 PLY HI ABS (GAUZE/BANDAGES/DRESSINGS) ×1
BNDG CMPR STD VLCR NS LF 5.8X4 (GAUZE/BANDAGES/DRESSINGS) ×2
BNDG COHESIVE 4X5 TAN STRL (GAUZE/BANDAGES/DRESSINGS) ×2 IMPLANT
BNDG CONFORM 2 STRL LF (GAUZE/BANDAGES/DRESSINGS) ×2 IMPLANT
BNDG CONFORM 3 STRL LF (GAUZE/BANDAGES/DRESSINGS) ×2 IMPLANT
BNDG ELASTIC 4X5.8 VLCR NS LF (GAUZE/BANDAGES/DRESSINGS) ×4 IMPLANT
BNDG ESMARK 4X12 TAN STRL LF (GAUZE/BANDAGES/DRESSINGS) ×2 IMPLANT
BNDG GAUZE 4.5X4.1 6PLY STRL (MISCELLANEOUS) ×2 IMPLANT
BNDG STRETCH 4X75 STRL LF (GAUZE/BANDAGES/DRESSINGS) ×2 IMPLANT
CANISTER SUCT 1200ML W/VALVE (MISCELLANEOUS) ×2 IMPLANT
COVER LIGHT HANDLE STERIS (MISCELLANEOUS) ×4 IMPLANT
COVER PIN YLW 0.028-062 (MISCELLANEOUS) ×4 IMPLANT
COVER WAND RF STERILE (DRAPES) ×2 IMPLANT
CUFF TOURN SGL QUICK 18X4 (TOURNIQUET CUFF) ×2 IMPLANT
CUFF TOURN SGL QUICK 30 (TOURNIQUET CUFF)
CUFF TRNQT CYL 30X4X21-28X (TOURNIQUET CUFF) IMPLANT
DRAPE FLUOR MINI C-ARM 54X84 (DRAPES) ×2 IMPLANT
DRSG TEGADERM 4X4.75 (GAUZE/BANDAGES/DRESSINGS) ×2 IMPLANT
DURAPREP 26ML APPLICATOR (WOUND CARE) ×2 IMPLANT
ELECT REM PT RETURN 9FT ADLT (ELECTROSURGICAL) ×2
ELECTRODE REM PT RTRN 9FT ADLT (ELECTROSURGICAL) ×1 IMPLANT
GAUZE SPONGE 4X4 12PLY STRL (GAUZE/BANDAGES/DRESSINGS) ×2 IMPLANT
GAUZE XEROFORM 1X8 LF (GAUZE/BANDAGES/DRESSINGS) ×2 IMPLANT
GLOVE SURG ENC MOIS LTX SZ7 (GLOVE) ×2 IMPLANT
GLOVE SURG UNDER LTX SZ7 (GLOVE) ×2 IMPLANT
GLOVE SURG UNDER POLY LF SZ7 (GLOVE) ×2 IMPLANT
GOWN STRL REUS W/ TWL LRG LVL3 (GOWN DISPOSABLE) ×2 IMPLANT
GOWN STRL REUS W/TWL LRG LVL3 (GOWN DISPOSABLE) ×4
IMPL FDL SYSTEM 5.5 (Shoulder) IMPLANT
IMPL SYS BIOCOMP ACH SPEED (Anchor) IMPLANT
IMPLANT FDL SYSTEM 5.5 (Shoulder) ×2 IMPLANT
IMPLANT SYS BIOCOMP ACH SPEED (Anchor) ×2 IMPLANT
K-WIRE DBL END TROCAR 6X.045 (WIRE) ×2
KIT SIZER GRAFT TENODESIS SUTR (KITS) ×1 IMPLANT
KIT TURNOVER KIT A (KITS) ×2 IMPLANT
KWIRE DBL END TROCAR 6X.045 (WIRE) ×1 IMPLANT
LABEL OR SOLS (LABEL) ×2 IMPLANT
MANIFOLD NEPTUNE II (INSTRUMENTS) ×2 IMPLANT
NDL FILTER BLUNT 18X1 1/2 (NEEDLE) ×1 IMPLANT
NEEDLE FILTER BLUNT 18X 1/2SAF (NEEDLE) ×1
NEEDLE FILTER BLUNT 18X1 1/2 (NEEDLE) ×1 IMPLANT
NS IRRIG 500ML POUR BTL (IV SOLUTION) ×2 IMPLANT
PACK EXTREMITY ARMC (MISCELLANEOUS) ×2 IMPLANT
PAD PREP 24X41 OB/GYN DISP (PERSONAL CARE ITEMS) ×2 IMPLANT
PADDING CAST BLEND 4X4 NS (MISCELLANEOUS) ×6 IMPLANT
PENCIL SMOKE EVACUATOR (MISCELLANEOUS) ×2 IMPLANT
RASP SM TEAR CROSS CUT (RASP) ×2 IMPLANT
SPLINT CAST 1 STEP 4X30 (MISCELLANEOUS) ×2 IMPLANT
SPLINT FAST PLASTER 5X30 (CAST SUPPLIES) ×1
SPLINT PLASTER CAST FAST 5X30 (CAST SUPPLIES) ×1 IMPLANT
SPONGE LAP 18X18 RF (DISPOSABLE) ×2 IMPLANT
STAPLER SKIN PROX 35W (STAPLE) ×2 IMPLANT
STOCKINETTE IMPERVIOUS 9X36 MD (GAUZE/BANDAGES/DRESSINGS) ×2 IMPLANT
STOCKINETTE M/LG 89821 (MISCELLANEOUS) ×2 IMPLANT
STOCKINETTE STRL 6IN 960660 (GAUZE/BANDAGES/DRESSINGS) ×2 IMPLANT
STRIP CLOSURE SKIN 1/4X4 (GAUZE/BANDAGES/DRESSINGS) ×2 IMPLANT
SUT ETHILON 3-0 FS-10 30 BLK (SUTURE) ×2
SUT ETHILON 4-0 (SUTURE) ×2
SUT ETHILON 4-0 FS2 18XMFL BLK (SUTURE) ×1
SUT ETHILON NAB PS2 4-0 18IN (SUTURE) ×2 IMPLANT
SUT VIC AB 2-0 CT1 27 (SUTURE) ×2
SUT VIC AB 2-0 CT1 TAPERPNT 27 (SUTURE) ×1 IMPLANT
SUT VIC AB 3-0 SH 27 (SUTURE) ×2
SUT VIC AB 3-0 SH 27X BRD (SUTURE) ×1 IMPLANT
SUT VIC AB 4-0 FS2 27 (SUTURE) ×4 IMPLANT
SUT VICRYL AB 3-0 FS1 BRD 27IN (SUTURE) ×2 IMPLANT
SUTURE EHLN 3-0 FS-10 30 BLK (SUTURE) ×1 IMPLANT
SUTURE ETHLN 4-0 FS2 18XMF BLK (SUTURE) ×1 IMPLANT
SYR 10ML LL (SYRINGE) ×4 IMPLANT
SYR 3ML LL SCALE MARK (SYRINGE) ×2 IMPLANT
WAND TOPAZ MICRO DEBRIDER (MISCELLANEOUS) ×1 IMPLANT
WIRE Z .062 C-WIRE SPADE TIP (WIRE) IMPLANT

## 2021-02-08 NOTE — Anesthesia Procedure Notes (Signed)
Anesthesia Regional Block: Popliteal block   Pre-Anesthetic Checklist: ,, timeout performed, Correct Patient, Correct Site, Correct Laterality, Correct Procedure, Correct Position, site marked, Risks and benefits discussed,  Surgical consent,  Pre-op evaluation,  At surgeon's request and post-op pain management  Laterality: Lower and Right  Prep: chloraprep       Needles:  Injection technique: Single-shot  Needle Type: Echogenic Needle     Needle Length: 9cm  Needle Gauge: 21     Additional Needles:   Procedures:,,,, ultrasound used (permanent image in chart),,,,  Narrative:  Injection made incrementally with aspirations every 5 mL.  Performed by: Personally  Anesthesiologist: Piscitello, Cleda Mccreedy, MD  Additional Notes: Patient consented for risk and benefits of nerve block including but not limited to nerve damage, failed block, bleeding and infection.  Patient voiced understanding.  Functioning IV was confirmed and monitors were applied.  Timeout done prior to procedure and prior to any sedation being given to the patient.  Patient confirmed procedure site prior to any sedation given to the patient.  A 62mm 22ga Stimuplex needle was used. Sterile prep,hand hygiene and sterile gloves were used.  Minimal sedation used for procedure.  No paresthesia endorsed by patient during the procedure.  Negative aspiration and negative test dose prior to incremental administration of local anesthetic. The patient tolerated the procedure well with no immediate complications.

## 2021-02-08 NOTE — Op Note (Signed)
PODIATRY / FOOT AND ANKLE SURGERY OPERATIVE REPORT    SURGEON: Rosetta Posner, DPM  PRE-OPERATIVE DIAGNOSIS:  1.  Right gastroc equinus 2.  Mid substance and insertional Achilles tendinitis right 3.  Achilles midsubstance tendinosis with intrasubstance calcification right 4.  Posterior calcaneal heel spur at the insertion of Achilles tendon with Haglund's deformity  POST-OPERATIVE DIAGNOSIS: Same  PROCEDURE(S): 1. Right gastroc recession 2. Right Haglund's resection with posterior heel spur resection. 3. Right partial detachment with reattachment of Achilles tendon with Achilles tendon debridement and repair with Topaz 4. Right flexor hallucis longus tendon transfer  HEMOSTASIS: Right thigh tourniquet  ANESTHESIA: general  ESTIMATED BLOOD LOSS: 10 cc  FINDING(S): 1.  Insertional Achilles tendinitis with large posterior heel spur and small Haglund's deformity. 2.  Mid substance Achilles tendinosis with calcification of tendon and thickening with degeneration  PATHOLOGY/SPECIMEN(S): Right Achilles tendon midsubstance  INDICATIONS:   Sabrina Lucas is a 65 y.o. female who presents with chronic pain to the posterior aspect of the right heel in the mid substance of the Achilles tendon.  Patient does not note any specific injury to the area but states that she has had chronic issues to the back of the heel.  She has noticed a big knot to the back of the heel at the mid substance portion.  Patient has been treated conservatively with physical therapy, medication management/steroids, boot and brace applications, and heel lift/proper fitting shoes and orthoses without relief.  Discussed all treatment options with patient both conservative and surgical attempts at correction clamped interesting complications at this time patient is elected for surgical procedure described above, no guarantees given.  Patient had preoperative clearance per multiple specialties for  comorbidities..  DESCRIPTION: After obtaining full informed written consent, the patient was brought back to the operating room and placed prone upon the operating table.  The patient received IV antibiotics prior to induction.  Pneumatic thigh tourniquet was placed about the patient's right thigh after obtaining adequate anesthesia, the patient was prepped and draped in the standard fashion.  An Esmarch bandage was used to exsanguinate the right lower extremity and the pneumatic thigh tourniquet was inflated.  Attention was then directed to the gastroc aponeurosis area where an incision was made slightly distal to the myotendinous junction that was approximately 3 to 4 cm in length.  The incision was made parallel to the long axis of the leg.  The incision was deepened to the subcutaneous tissues utilizing sharp and blunt dissection care was taken to identify retract all vital neurovascular structures all venous contributories were cauterized as necessary.  At this time the sural nerve and small saphenous vein were identified and retracted throughout the remainder of the case in this area.  An incision was made into the deep fascia which was divided and retracted thereby exposing the gastroc aponeurosis.  At this time the plantaris tendon was identified and released and the gastroc aponeurosis was released from medial to lateral in its entirety.  Increased dorsiflexion was noted across the ankle joint to about 10 degrees indicating successful release.  The surgical site was flushed with copious amounts normal sterile saline.  The deep fascia was then reapproximated well coapted with 3-0 Vicryl.  The subcutaneous tissues were approximated well coapted with 4-0 Vicryl and the skin was reapproximated well coapted with skin staples.  Attention was then directed to the posterior aspect of the right heel in the mid substance of the Achilles tendon where a linear longitudinal incision was made through the  midline  of this area parallel to the long axis of the foot.  The incision was deepened to the subcutaneous tissues utilizing sharp and blunt dissection care was taken to identify and retract all vital neurovascular structures no venous contributories were cauterized as necessary.  At this time an incision was made into the peritenon which was divided along the longitudinal axis of the Achilles tendon and retracted medially and laterally thereby exposing the Achilles tendon in its entirety.  At this time a midline incision was made through the Achilles tendon centrally through the mid substance area and guided all the way to the insertion point on the calcaneus where a T-type of the incision was made.  The Achilles tendon was then reflected medially and laterally off of the calcaneus thereby exposing the posterior heel spur and Haglund's deformity.  The Haglund's deformity and posterior heel spur were resected and passed off the operative site.  There was notable insertional Achilles calcification at the insertion point of the Achilles tendon so a portion of the tendon was debrided and removed removing the calcific disease tendon.  The tendon was also debulked further at the insertion point due to the increased thickness present as well as disease in the fibers to healthy tissue.  C-arm imaging was utilized to verify resection of the posterior heel spur and Haglund's resection which appeared to be adequate as the heel appeared to have a normal contour at this time and appeared to be decompressed clinically.  The mid substance portion of the Achilles tendon was then examined further and noted to have notable thickness as well as intratendinous calcification at the mid substance level with more being at the medial central aspect of the tendon than the lateral aspect as was seen on the MRI preoperatively.  This diseased tendon was debulked and the calcific tendon was removed and passed off the operative site and sent off to  pathology.  About 50% of the tendon was removed during this process removing all disease fibers to healthy tissue.  Since 50% of the tendon was removed or debulked it was determined at this time to perform a flexor hallucis longus tendon transfer.  Attention was then directed deeper through the same incision to the fascia at the posterior aspect of the leg and heel where the fascia was divided.  The flexor hallucis longus muscle belly was identified and the tendon was then identified and its tendon sheath.  The tendon sheath was then transected thereby exposing more of the distal aspect of the tendon.  Once adequate length was obtained the tendon was transected and the flexor hallucis longus was then brought to the posterior aspect of the heel and measured.  Appeared to have adequate length overall.  At this time a whipstitch was then placed to the flexor hallucis longus tendon.  The guidewire for the tenodesis screw/FHL suture button was then placed through the posterior superior aspect of the calcaneus directed anterior plantar distal slightly distal to the plantar medial tubercle of the calcaneal tuberosity with the appropriate orientation.  This was done under fluoroscopic guidance.  Once this was performed the reamer was then used to remove all based on the tendon size that was measured.  Tendon size was measured to be 5 mm.  The suture from the whipstitch was then passed through the FHL suture button.  The assisted device was then placed on the button and the button was passed through the bone tunnel that was made after the wire was removed.  Once successfully through the plantar cortex the button was pushed and the suture button was flipped.  Guidance was then obtained of the FHL through the hole obtaining appropriate tension on the FHL as it was placed through the hole while holding the foot in a slightly plantarflexed position.  The suture coming out of the hole was then tied to itself per manufacturer's  protocol.  The Arthrex 5 mm Bio-Tenodesis screw was then placed through the hole completing the FHL transfer.  It appeared to sit excellently overall with good tension across the site.  The Topaz instrumentation was then used to debride the Achilles tendon further along the course of the tendon especially at the mid substance level and at the insertion point.  The mid substance tear was then repaired with 2-0 Vicryl in a running interconnected stitch and sutured slightly to the FHL muscle belly.  At this time the Arthrex speed bridge system was then put in place with 2 holes being set proximal and 2 being slightly distal to that with the appropriate orientation and slightly more posterior to the FHL tendon whole.  This was done under fluoroscopic guidance.  Using standard manufactures technique and AO techniques the 2 proximal anchors were then placed.  The sutures then brought through the 2 tendon flaps that were made and then the 2 distal rows were also placed holding appropriate tension and the sutures were then directed through those holes completing the speed bridge system.  Reinforcement was performed of the repair with 2-0 Vicryl.  A flush was performed with copious amounts normal sterile saline.  The peritenon was then reapproximated well coapted with 4-0 Vicryl.  The subcutaneous tissues were approximated with 4-0 Vicryl and the skin was then reapproximated well coapted with 4-0 nylon in a combination of simple and horizontal mattress type stitching.  The pneumatic thigh tourniquet was deflated and a prompt type and response was noted all digits of the right foot.  20 cc of a one-to-one mix of Exparel and saline were then injected about the operative sites.  A dressing was then applied consisting of Xeroform to the incision lines, 4 x 4 gauze, Kerlix, web roll, posterior splint, Ace wrap.  Patient tolerated the procedure and anesthesia well was transferred to recovery room vital signs stable and  vascular status intact to all toes of the right foot.  Patient to be discharged home after surgery with the appropriate orders and medications as well as instructions and instructed to follow-up within 1 week of surgical date in clinic.  COMPLICATIONS: None  CONDITION: Good, stable  Rosetta Posner, DPM

## 2021-02-08 NOTE — Anesthesia Procedure Notes (Signed)
Procedure Name: Intubation Performed by: Diavion Labrador Ben, CRNA Pre-anesthesia Checklist: Patient identified, Emergency Drugs available, Suction available and Patient being monitored Patient Re-evaluated:Patient Re-evaluated prior to induction Oxygen Delivery Method: Circle system utilized Preoxygenation: Pre-oxygenation with 100% oxygen Induction Type: IV induction Ventilation: Mask ventilation without difficulty Laryngoscope Size: Mac and 3 Grade View: Grade I Tube type: Oral Tube size: 7.0 mm Number of attempts: 1 Airway Equipment and Method: Stylet and Oral airway Placement Confirmation: ETT inserted through vocal cords under direct vision,  positive ETCO2 and breath sounds checked- equal and bilateral Secured at: 21 cm Tube secured with: Tape Dental Injury: Teeth and Oropharynx as per pre-operative assessment        

## 2021-02-08 NOTE — Anesthesia Postprocedure Evaluation (Signed)
Anesthesia Post Note  Patient: Sabrina Lucas  Procedure(s) Performed: GASTROC RECESSION- RIGHT (Right ) FHL TRANSFER; DEEP- RIGHT (Right ) ACHILLES TENDON REPAIR, SECONDARY- RIGHT (Right ) OSTECTOMY- HAGLUNDS/RETROCALCANEAL (Right )  Patient location during evaluation: PACU Anesthesia Type: General Level of consciousness: awake and alert Pain management: pain level controlled Vital Signs Assessment: post-procedure vital signs reviewed and stable Respiratory status: spontaneous breathing, nonlabored ventilation, respiratory function stable and patient connected to nasal cannula oxygen Cardiovascular status: blood pressure returned to baseline and stable Postop Assessment: no apparent nausea or vomiting Anesthetic complications: no   No complications documented.   Last Vitals:  Vitals:   02/08/21 1145 02/08/21 1202  BP: 91/67 (!) 91/55  Pulse: 86 84  Resp: 15 16  Temp: 36.6 C 36.9 C  SpO2: 97% 95%    Last Pain:  Vitals:   02/08/21 1202  TempSrc: Temporal  PainSc: 0-No pain                 Yevette Edwards

## 2021-02-08 NOTE — H&P (Signed)
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  The various methods of treatment have been discussed with the patient.  No guarantees were given.  After consideration of risks, benefits and other options for treatment, the patient has consented to surgery.  I have reviewed the patients' chart and labs. Patient has been cleared by PCP, cardiology, and pulmonology.   PROCEDURE: RIGHT GASTROC RECESSION, HAGLUNDS AND POSTERIOR HEEL SPUR RESECTION WITH PARTIAL DETACHMENT AND REATTACHMENT OF ACHILLES TENDON WITH DEBRIDEMENT OF ACHILLES TENDON AND POSSIBLE GRAFT APPLICATION, FHL TENDON TRANSFER  A history and physical examination was performed in my office.  The patient was reexamined.  There have been no changes to this history and physical examination.  Rosetta Posner, DPM

## 2021-02-08 NOTE — Anesthesia Preprocedure Evaluation (Signed)
Anesthesia Evaluation  Patient identified by MRN, date of birth, ID band Patient awake    Reviewed: Allergy & Precautions, H&P , NPO status , Patient's Chart, lab work & pertinent test results, reviewed documented beta blocker date and time   Airway Mallampati: III  TM Distance: >3 FB Neck ROM: full    Dental  (+) Edentulous Upper, Edentulous Lower   Pulmonary asthma , sleep apnea , former smoker,    Pulmonary exam normal        Cardiovascular Exercise Tolerance: Poor hypertension, On Medications + CAD, + Past MI and + DOE  (-) Orthopnea Normal cardiovascular exam Rhythm:regular Rate:Normal     Neuro/Psych PSYCHIATRIC DISORDERS Anxiety Depression  Neuromuscular disease    GI/Hepatic negative GI ROS, Neg liver ROS,   Endo/Other  diabetes, Type 2, Oral Hypoglycemic AgentsMorbid obesity  Renal/GU negative Renal ROS  negative genitourinary   Musculoskeletal   Abdominal   Peds  Hematology negative hematology ROS (+)   Anesthesia Other Findings Past Medical History: No date: Anxiety No date: Asthma No date: CAD (coronary artery disease) No date: Depression No date: Diabetes mellitus without complication (HCC)     Comment:  Type II No date: DOE (dyspnea on exertion) No date: Fibromyalgia No date: GERD (gastroesophageal reflux disease) No date: Grade II diastolic dysfunction No date: History of kidney stones     Comment:  passed 3 kidney stones this week (02/02/21) No date: Hyperlipidemia No date: Hypertension 2005: Myocardial infarction [facility])     Comment:  1 stent No date: On supplemental oxygen therapy 2020: Pancreatitis No date: PTSD (post-traumatic stress disorder) No date: Sleep apnea     Comment:  no cpap. on back order x 2.5 years!! uses nasal prongs               at night Past Surgical History: No date: ABDOMINAL HYSTERECTOMY     Comment:  partial 2005: CARDIAC CATHETERIZATION     Comment:  1  stent 2005: CARDIAC SURGERY     Comment:  stent x 1 2016: CHOLECYSTECTOMY 2003: MOUTH SURGERY     Comment:  teeth removed in their entirety 06/2019: pancreas stent 1983: TONSILLECTOMY BMI    Body Mass Index: 41.66 kg/m     Reproductive/Obstetrics negative OB ROS                             Anesthesia Physical Anesthesia Plan  ASA: III  Anesthesia Plan: General ETT   Post-op Pain Management:  Regional for Post-op pain   Induction:   PONV Risk Score and Plan:   Airway Management Planned:   Additional Equipment:   Intra-op Plan:   Post-operative Plan:   Informed Consent: I have reviewed the patients History and Physical, chart, labs and discussed the procedure including the risks, benefits and alternatives for the proposed anesthesia with the patient or authorized representative who has indicated his/her understanding and acceptance.     Dental Advisory Given  Plan Discussed with: CRNA  Anesthesia Plan Comments: (Pt accepts risks and benefits of post op popliteal block in pacu for post op pain.  This is also requested per Dr. Excell Seltzer)        Anesthesia Quick Evaluation

## 2021-02-08 NOTE — Transfer of Care (Signed)
Immediate Anesthesia Transfer of Care Note  Patient: Sabrina Lucas  Procedure(s) Performed: GASTROC RECESSION- RIGHT (Right ) FHL TRANSFER; DEEP- RIGHT (Right ) ACHILLES TENDON REPAIR, SECONDARY- RIGHT (Right ) OSTECTOMY- HAGLUNDS/RETROCALCANEAL (Right )  Patient Location: PACU  Anesthesia Type:General  Level of Consciousness: awake and alert   Airway & Oxygen Therapy: Patient Spontanous Breathing and Patient connected to nasal cannula oxygen  Post-op Assessment: Report given to RN and Post -op Vital signs reviewed and stable  Post vital signs: Reviewed and stable  Last Vitals:  Vitals Value Taken Time  BP 105/76 02/08/21 1030  Temp    Pulse 88 02/08/21 1030  Resp 16 02/08/21 1030  SpO2 93 % 02/08/21 1030  Vitals shown include unvalidated device data.  Last Pain:  Vitals:   02/08/21 0708  TempSrc: Oral  PainSc: 6       Patients Stated Pain Goal: 1 (02/08/21 0708)  Complications: No complications documented.

## 2021-02-08 NOTE — Discharge Instructions (Signed)
[facility]  POST OPERATIVE INSTRUCTIONS FOR DR. TROXLER, DR. Ether Griffins, AND [facility]   1. Take your medication as prescribed.  Pain medication should be taken only as needed.  You may supplement pain medication with ibuprofen and Tylenol.  If pain is still out of control after taking appropriate pain medicine then you may try taking 1 pill every 4 hours instead of every 6.  If pain persist thereafter then try taking 2 pills every 6 hours.  If pain persist still that is out of control then please contact clinic for further instruction.  2. Keep the dressing clean, dry and intact.  Remain nonweightbearing at all times to the right lower extremity.  3. Keep your foot elevated above the heart level for the first 48 hours.  Continue elevation thereafter to improve swelling.  4. Walking to the bathroom and brief periods of walking are acceptable, unless we have instructed you to be non-weight bearing.  5. Always wear your post-op shoe when walking.  Always use your crutches or knee scooter if you are to be non-weight bearing.  6. Do not take a shower. Baths are permissible as long as the foot is kept out of the water.   7. Every hour you are awake:  - Bend your knee 15 times. - Massage calf 15 times  8. Call [facility] [phone]) if any of the following problems occur: - You develop a temperature or fever. - The bandage becomes saturated with blood. - Medication does not stop your pain. - Injury of the foot occurs. - Any symptoms of infection including redness, odor, or red streaks running from wound.  Begin taking Lovenox medication as well as aspirin as directed starting tomorrow.  Take antibiotics as prescribed.  Follow-up in clinic within 1 week of surgical date.   AMBULATORY SURGERY  DISCHARGE INSTRUCTIONS   1) The drugs that you were given will stay in your system until tomorrow so for the  next 24 hours you should not:  A) Drive an automobile B) Make any legal decisions C) Drink any alcoholic beverage   2) You may resume regular meals tomorrow.  Today it is better to start with liquids and gradually work up to solid foods.  You may eat anything you prefer, but it is better to start with liquids, then soup and crackers, and gradually work up to solid foods.   3) Please notify your doctor immediately if you have any unusual bleeding, trouble breathing, redness and pain at the surgery site, drainage, fever, or pain not relieved by medication.  4) Your post-operative visit with Dr.                                     is: Date:                        Time:    Please call to schedule your post-operative visit.  5) Additional Instructions:   Bupivacaine Liposomal Suspension for Injection  LEAVE GREEN ARM BAND FOR 4 DAYS What is this medicine? BUPIVACAINE LIPOSOMAL (bue PIV a kane LIP oh som al) is an anesthetic. It causes loss of feeling in the skin or other tissues. It is used to prevent and to treat pain from some procedures. This medicine may be used for other purposes; ask your health care provider or  pharmacist if you have questions. COMMON BRAND NAME(S): EXPAREL What should I tell my health care provider before I take this medicine? They need to know if you have any of these conditions:  G6PD deficiency  heart disease  kidney disease  liver disease  low blood pressure  lung or breathing disease, like asthma  an unusual or allergic reaction to bupivacaine, other medicines, foods, dyes, or preservatives  pregnant or trying to get pregnant  breast-feeding How should I use this medicine? This medicine is injected into the affected area. It is given by a health care provider in a hospital or clinic setting. Talk to your health care provider about the use of this medicine in children. While it may be given to children as young as 6 years for selected  conditions, precautions do apply. Overdosage: If you think you have taken too much of this medicine contact a poison control center or emergency room at once. NOTE: This medicine is only for you. Do not share this medicine with others. What if I miss a dose? This does not apply. What may interact with this medicine? This medicine may interact with the following medications:  acetaminophen  certain antibiotics like dapsone, nitrofurantoin, aminosalicylic acid, sulfonamides  certain medicines for seizures like phenobarbital, phenytoin, valproic acid  chloroquine  cyclophosphamide  flutamide  hydroxyurea  ifosfamide  metoclopramide  nitric oxide  nitroglycerin  nitroprusside  nitrous oxide  other local anesthetics like lidocaine, pramoxine, tetracaine  primaquine  quinine  rasburicase  sulfasalazine This list may not describe all possible interactions. Give your health care provider a list of all the medicines, herbs, non-prescription drugs, or dietary supplements you use. Also tell them if you smoke, drink alcohol, or use illegal drugs. Some items may interact with your medicine. What should I watch for while using this medicine? Your condition will be monitored carefully while you are receiving this medicine. Be careful to avoid injury while the area is numb, and you are not aware of pain. What side effects may I notice from receiving this medicine? Side effects that you should report to your doctor or health care professional as soon as possible:  allergic reactions like skin rash, itching or hives, swelling of the face, lips, or tongue  seizures  signs and symptoms of a dangerous change in heartbeat or heart rhythm like chest pain; dizziness; fast, irregular heartbeat; palpitations; feeling faint or lightheaded; falls; breathing problems  signs and symptoms of methemoglobinemia such as pale, gray, or blue colored skin; headache; fast heartbeat; shortness of  breath; feeling faint or lightheaded, falls; tiredness Side effects that usually do not require medical attention (report to your doctor or health care professional if they continue or are bothersome):  anxious  back pain  changes in taste  changes in vision  constipation  dizziness  fever  nausea, vomiting This list may not describe all possible side effects. Call your doctor for medical advice about side effects. You may report side effects to FDA at 1-800-FDA-1088. Where should I keep my medicine? This drug is given in a hospital or clinic and will not be stored at home. NOTE: This sheet is a summary. It may not cover all possible information. If you have questions about this medicine, talk to your doctor, pharmacist, or health care provider.  2021 Elsevier/Gold Standard (2020-01-06 12:24:57)

## 2021-02-09 ENCOUNTER — Encounter: Payer: Self-pay | Admitting: Podiatry

## 2021-02-09 LAB — SURGICAL PATHOLOGY

## 2021-03-19 DIAGNOSIS — I2510 Atherosclerotic heart disease of native coronary artery without angina pectoris: Principal | ICD-10-CM

## 2021-03-19 DIAGNOSIS — E785 Hyperlipidemia, unspecified: Principal | ICD-10-CM

## 2021-03-19 MED ORDER — REPATHA SYRINGE 140 MG/ML SUBCUTANEOUS SYRINGE
SUBCUTANEOUS | 3 refills | 84 days
Start: 2021-03-19 — End: ?

## 2021-03-19 NOTE — Unmapped (Signed)
[facility] Clinical Assessment & Refill Coordination Note    Marie Quinn, [place]: [birth year]  Phone: [phone] (home)     All above HIPAA information was verified with patient.     Was a Nurse, learning disability used for this call? No    Specialty Medication(s):   General Specialty: Repatha     Current Outpatient Medications   Medication Sig Dispense Refill   ??? albuterol 2.5 mg /3 mL (0.083 %) nebulizer solution Inhale the contents of 1 vial (2.5 mg total) by nebulization every four (4) hours as needed for wheezing. 150 mL 1   ??? albuterol HFA 90 mcg/actuation inhaler Inhale 2 puffs every four (4) hours as needed for wheezing. 18 g 1   ??? amLODIPine (NORVASC) 10 MG tablet Take 1 tablet (10 mg total) by mouth daily. 90 tablet 2   ??? aspirin (ECOTRIN) 81 MG tablet Take 81 mg by mouth daily.     ??? buPROPion (WELLBUTRIN SR) 150 MG 12 hr tablet Take 1 tablet (150 mg total) by mouth Two (2) times a day. 60 tablet 3   ??? citalopram (CELEXA) 40 MG tablet TAKE (1) TABLET BY MOUTH EVERY DAY 90 tablet 3   ??? empty container (SHARPS CONTAINER) Misc Use as directed. 1 each 3   ??? empty container Misc Use as directed to dispose of injectable medications 1 each 3   ??? evolocumab (REPATHA SYRINGE) 140 mg/mL Syrg Inject the contents of 1 syringe (140 mg) under the skin every fourteen (14) days. 6 mL 3   ??? gabapentin (NEURONTIN) 100 MG capsule  (Patient not taking: Reported on 02/08/2020)     ??? hydrOXYzine (ATARAX) 25 MG tablet Take 1 tablet (25 mg total) by mouth every six (6) hours as needed for anxiety and congestion. 90 tablet 3   ??? losartan (COZAAR) 100 MG tablet Take 1 tablet (100 mg total) by mouth daily. 90 tablet 1   ??? meloxicam (MOBIC) 15 MG tablet      ??? methylPREDNISolone (MEDROL DOSEPACK) 4 mg tablet      ??? metoprolol succinate (TOPROL-XL) 100 MG 24 hr tablet Take 1 tablet (100 mg total) by mouth two (2) times a day. Repeat for elevated BP. 180 tablet 3   ??? omeprazole (PRILOSEC) 20 MG capsule Take 1 capsule (20 mg total) by mouth two (2) times a day. 180 capsule 1   ??? ondansetron (ZOFRAN-ODT) 8 MG disintegrating tablet      ??? peg-electrolyte soln (GOLYTELY) 420 gram SolR Take as directed in the instructions sent to you via your [facility] (Patient not taking: Reported on 03/29/2020) 2 Bottle 0   ??? predniSONE (DELTASONE) 10 mg tablet pack prednisone 10 mg tablets in a dose pack   Take 1 dose pk by oral route.     ??? pregabalin (LYRICA) 25 MG capsule Take 25 mg by mouth Two (2) times a day.     ??? promethazine-codeine (PHENERGAN WITH CODEINE) 6.25-10 mg/5 mL syrup 1-2 tsp po every 6 hours as needed for cough and congestion. 120 mL 0   ??? traMADoL (ULTRAM) 50 mg tablet      ??? traMADoL (ULTRAM) 50 mg tablet tramadol 50 mg tablet   Take 1 tablet every 6-8 hours by oral route.       No current facility-administered medications for this visit.        Changes to medications: Alexxa reports no changes at this time.    Allergies   Allergen Reactions   ???  Iodine And Iodide Containing Products Anaphylaxis   ??? Statins-Hmg-Coa Reductase Inhibitors      Side effects   ??? Sulfa (Sulfonamide Antibiotics)    ??? Oxycodone Nausea Only     Makes her feel weird       Changes to allergies: No    SPECIALTY MEDICATION ADHERENCE     Repatha 140 mg/ml: 0 days of medicine on hand     Medication Adherence    Patient reported X missed doses in the last month: 0  Specialty Medication: Repatha 140 mg/mL  Patient is on additional specialty medications: No  Informant: patient          Specialty medication(s) dose(s) confirmed: Regimen is correct and unchanged.     Are there any concerns with adherence? No    Adherence counseling provided? Not needed    CLINICAL MANAGEMENT AND INTERVENTION      Clinical Benefit Assessment:    Do you feel the medicine is effective or helping your condition? Unable to determine at this time    Clinical Benefit counseling provided? Not needed    Adverse Effects Assessment:    Are you experiencing any side effects? No    Are you experiencing difficulty administering your medicine? No    Quality of Life Assessment:    How many days over the past month did your hyperlipidemia  keep you from your normal activities? For example, brushing your teeth or getting up in the morning. 0    Have you discussed this with your provider? Not needed    Acute Infection Status:    Acute infections noted within Epic:  No active infections  Patient reported infection: None    Therapy Appropriateness:    Is therapy appropriate? Yes, therapy is appropriate and should be continued    DISEASE/MEDICATION-SPECIFIC INFORMATION      For patients on injectable medications: Patient currently has 0 doses left.  Next injection is scheduled for this week.    PATIENT SPECIFIC NEEDS     - Does the patient have any physical, cognitive, or cultural barriers? No    - Is the patient high risk? No    - Does the patient require a Care Management Plan? No     - Does the patient require physician intervention or other additional services (i.e. nutrition, smoking cessation, social work)? No      SHIPPING     Specialty Medication(s) to be Shipped:   General Specialty: Repatha    Other medication(s) to be shipped: No additional medications requested for fill at this time     Changes to insurance: No    Delivery Scheduled: Yes, Expected medication delivery date: 03/21/21.  However, Rx request for refills was sent to the provider as there are none remaining.     Medication will be delivered via Same Day Courier to the confirmed prescription address in [facility].    The patient will receive a drug information handout for each medication shipped and additional FDA Medication Guides as required.  Verified that patient has previously received a Conservation officer, historic buildings and a Surveyor, mining.    The patient or caregiver noted above participated in the development of this care plan and knows that they can request review of or adjustments to the care plan at any time.      All of the patient's questions and concerns have been addressed.    Arnold Long   [facility] Specialty Pharmacist

## 2021-03-22 DIAGNOSIS — E785 Hyperlipidemia, unspecified: Principal | ICD-10-CM

## 2021-03-22 DIAGNOSIS — I2510 Atherosclerotic heart disease of native coronary artery without angina pectoris: Principal | ICD-10-CM

## 2021-03-22 MED ORDER — REPATHA SYRINGE 140 MG/ML SUBCUTANEOUS SYRINGE
SUBCUTANEOUS | 3 refills | 84 days | Status: CP
Start: 2021-03-22 — End: ?
  Filled 2021-03-29: qty 6, 84d supply, fill #0

## 2021-03-23 NOTE — Unmapped (Signed)
Marie Quinn 's Repatha shipment will be delayed as a result of no refills remain on the prescription.      I have reached out to the patient  at [phone] and was unable to leave a message. We will wait for a call back from the patient to reschedule the delivery.  We have not confirmed the new delivery date.      Rx was sent to wrong pharmacy, but clinic has re-sent to [facility] after scheduled delivery date.

## 2021-03-28 NOTE — Unmapped (Signed)
Marie Quinn 's Repatha shipment will be sent out  as a result of a new prescription for the medication has been received.      I have reached out to the patient  at [phone] and communicated the delivery change. We will reschedule the medication for the delivery date that the patient agreed upon.  We have confirmed the delivery date as 03/29/21

## 2021-03-28 NOTE — Unmapped (Signed)
Marie Quinn 's Repatha shipment will be canceled  as a result of not being able to reach pt to reschedule delivery    I have reached out to the patient  at [phone] and was unable to leave a message. We will not reschedule the medication and have removed this/these medication(s) from the work request.  We have canceled this work request.

## 2021-04-24 ENCOUNTER — Other Ambulatory Visit: Payer: Self-pay | Admitting: Orthopedic Surgery

## 2021-04-24 DIAGNOSIS — G8929 Other chronic pain: Secondary | ICD-10-CM

## 2021-04-26 ENCOUNTER — Ambulatory Visit
Admission: RE | Admit: 2021-04-26 | Discharge: 2021-04-26 | Disposition: A | Discharge status: Home / Self Care | Payer: Medicare Other | Source: Ambulatory Visit | Attending: Orthopedic Surgery | Admitting: Orthopedic Surgery

## 2021-04-26 ENCOUNTER — Other Ambulatory Visit: Payer: Self-pay

## 2021-04-26 DIAGNOSIS — M25561 Pain in right knee: Secondary | ICD-10-CM | POA: Diagnosis not present

## 2021-04-26 DIAGNOSIS — G8929 Other chronic pain: Secondary | ICD-10-CM | POA: Diagnosis present

## 2021-06-08 NOTE — Unmapped (Signed)
[facility] Refill Coordination Note    Specialty Medication(s) to be Shipped:   General Specialty: Repatha    Other medication(s) to be shipped: No additional medications requested for fill at this time     Marie Quinn, DOB: [birth year]  Phone: [phone] (home)       All above HIPAA information was verified with patient.     Was a Nurse, learning disability used for this call? No    Completed refill call assessment today to schedule patient's medication shipment from the [facility] [phone]).  All relevant notes have been reviewed.     Specialty medication(s) and dose(s) confirmed: Regimen is correct and unchanged.   Changes to medications: Somaly reports no changes at this time.  Changes to insurance: No  New side effects reported not previously addressed with a pharmacist or physician: None reported  Questions for the pharmacist: No    Confirmed patient received a Conservation officer, historic buildings and a Surveyor, mining with first shipment. The patient will receive a drug information handout for each medication shipped and additional FDA Medication Guides as required.       DISEASE/MEDICATION-SPECIFIC INFORMATION        For patients on injectable medications: Patient currently has 1 doses left.  Next injection is scheduled for 08/30.    SPECIALTY MEDICATION ADHERENCE     Medication Adherence    Patient reported X missed doses in the last month: 0  Specialty Medication: Repatha 140mg /ml  Patient is on additional specialty medications: No  Patient is on more than two specialty medications: No  Any gaps in refill history greater than 2 weeks in the last 3 months: no  Demonstrates understanding of importance of adherence: yes  Informant: patient  Reliability of informant: reliable  Provider-estimated medication adherence level: good  Patient is at risk for Non-Adherence: No  Reasons for non-adherence: no problems identified              Were doses missed due to medication being on hold? No    repatha 140 mg/ml: 14 days of medicine on hand         REFERRAL TO PHARMACIST     Referral to the pharmacist: Not needed      [facility]     Shipping address confirmed in Epic.     Delivery Scheduled: Yes, Expected medication delivery date: 09/08.     Medication will be delivered via Same Day Courier to the prescription address in Epic [facility].    Marie Quinn   [facility] Specialty Technician

## 2021-06-21 MED FILL — REPATHA SYRINGE 140 MG/ML SUBCUTANEOUS SYRINGE: SUBCUTANEOUS | 84 days supply | Qty: 6 | Fill #1

## 2021-07-04 DIAGNOSIS — F419 Anxiety disorder, unspecified: Principal | ICD-10-CM

## 2021-07-04 MED ORDER — HYDROXYZINE HCL 25 MG TABLET
ORAL_TABLET | 3 refills | 0 days
Start: 2021-07-04 — End: ?

## 2021-07-04 NOTE — Unmapped (Signed)
Patient is requesting the following refill  Requested Prescriptions     Pending Prescriptions Disp Refills   ??? hydrOXYzine (ATARAX) 25 MG tablet [Pharmacy Med Name: HYDROXYZINE HCL 25 MG TAB] 90 tablet 3     Sig: TAKE (1) TABLET BY MOUTH EVERY 6 HOURS AS NEEDED FOR ANXIETY AND CONGESTION       Recent Visits  No visits were found meeting these conditions.  Showing recent visits within past 365 days with a meds authorizing provider and meeting all other requirements  Future Appointments  No visits were found meeting these conditions.  Showing future appointments within next 365 days with a meds authorizing provider and meeting all other requirements     Patients last visit with Dr. Nira Retort 02/08/2020, Visit with Grace Isaac on 03/29/20.  Patient needs appointment for additional refills

## 2021-07-06 MED ORDER — HYDROXYZINE HCL 25 MG TABLET
ORAL_TABLET | 0 refills | 0 days | Status: CP
Start: 2021-07-06 — End: ?

## 2021-08-13 ENCOUNTER — Emergency Department: Payer: Medicare Other

## 2021-08-13 ENCOUNTER — Other Ambulatory Visit: Payer: Self-pay

## 2021-08-13 DIAGNOSIS — E1122 Type 2 diabetes mellitus with diabetic chronic kidney disease: Secondary | ICD-10-CM | POA: Diagnosis not present

## 2021-08-13 DIAGNOSIS — Z87891 Personal history of nicotine dependence: Secondary | ICD-10-CM | POA: Insufficient documentation

## 2021-08-13 DIAGNOSIS — N183 Chronic kidney disease, stage 3 unspecified: Secondary | ICD-10-CM | POA: Diagnosis not present

## 2021-08-13 DIAGNOSIS — I2510 Atherosclerotic heart disease of native coronary artery without angina pectoris: Secondary | ICD-10-CM | POA: Insufficient documentation

## 2021-08-13 DIAGNOSIS — Z7951 Long term (current) use of inhaled steroids: Secondary | ICD-10-CM | POA: Diagnosis not present

## 2021-08-13 DIAGNOSIS — Z79899 Other long term (current) drug therapy: Secondary | ICD-10-CM | POA: Diagnosis not present

## 2021-08-13 DIAGNOSIS — R0602 Shortness of breath: Secondary | ICD-10-CM | POA: Diagnosis not present

## 2021-08-13 DIAGNOSIS — Z7901 Long term (current) use of anticoagulants: Secondary | ICD-10-CM | POA: Diagnosis not present

## 2021-08-13 DIAGNOSIS — N390 Urinary tract infection, site not specified: Secondary | ICD-10-CM | POA: Diagnosis not present

## 2021-08-13 DIAGNOSIS — I129 Hypertensive chronic kidney disease with stage 1 through stage 4 chronic kidney disease, or unspecified chronic kidney disease: Secondary | ICD-10-CM | POA: Insufficient documentation

## 2021-08-13 DIAGNOSIS — Z7984 Long term (current) use of oral hypoglycemic drugs: Secondary | ICD-10-CM | POA: Insufficient documentation

## 2021-08-13 DIAGNOSIS — Z7982 Long term (current) use of aspirin: Secondary | ICD-10-CM | POA: Diagnosis not present

## 2021-08-13 DIAGNOSIS — J45909 Unspecified asthma, uncomplicated: Secondary | ICD-10-CM | POA: Insufficient documentation

## 2021-08-13 DIAGNOSIS — R42 Dizziness and giddiness: Secondary | ICD-10-CM | POA: Insufficient documentation

## 2021-08-13 LAB — COMPREHENSIVE METABOLIC PANEL
ALT: 34 U/L (ref 0–44)
AST: 26 U/L (ref 15–41)
Albumin: 3.5 g/dL (ref 3.5–5.0)
Alkaline Phosphatase: 101 U/L (ref 38–126)
Anion gap: 8 (ref 5–15)
BUN: 15 mg/dL (ref 8–23)
CO2: 27 mmol/L (ref 22–32)
Calcium: 8.9 mg/dL (ref 8.9–10.3)
Chloride: 102 mmol/L (ref 98–111)
Creatinine, Ser: 0.79 mg/dL (ref 0.44–1.00)
GFR, Estimated: >60 mL/min (ref >60)
Glucose, Bld: 147 mg/dL — ABNORMAL HIGH (ref 70–99)
Potassium: 4.3 mmol/L (ref 3.5–5.1)
Sodium: 137 mmol/L (ref 135–145)
Total Bilirubin: 0.6 mg/dL (ref 0.3–1.2)
Total Protein: 7.8 g/dL (ref 6.5–8.1)

## 2021-08-13 LAB — CBC WITH DIFFERENTIAL/PLATELET
Abs Immature Granulocytes: 0.03 10*3/uL (ref 0.00–0.07)
Basophils Absolute: 0 10*3/uL (ref 0.0–0.1)
Basophils Relative: 1 %
Eosinophils Absolute: 0.2 10*3/uL (ref 0.0–0.5)
Eosinophils Relative: 3 %
HCT: 37.1 % (ref 36.0–46.0)
Hemoglobin: 12.4 g/dL (ref 12.0–15.0)
Immature Granulocytes: 0 %
Lymphocytes Relative: 32 %
Lymphs Abs: 2.5 10*3/uL (ref 0.7–4.0)
MCH: 28.6 pg (ref 26.0–34.0)
MCHC: 33.4 g/dL (ref 30.0–36.0)
MCV: 85.5 fL (ref 80.0–100.0)
Monocytes Absolute: 0.5 10*3/uL (ref 0.1–1.0)
Monocytes Relative: 6 %
Neutro Abs: 4.6 10*3/uL (ref 1.7–7.7)
Neutrophils Relative %: 58 %
Platelets: 282 10*3/uL (ref 150–400)
RBC: 4.34 MIL/uL (ref 3.87–5.11)
RDW: 14.6 % (ref 11.5–15.5)
WBC: 7.8 10*3/uL (ref 4.0–10.5)
nRBC: 0 % (ref 0.0–0.2)

## 2021-08-13 LAB — TROPONIN I (HIGH SENSITIVITY)
Troponin I (High Sensitivity): 5 ng/L (ref <18)
Troponin I (High Sensitivity): 6 ng/L (ref <18)

## 2021-08-13 LAB — BRAIN NATRIURETIC PEPTIDE: B Natriuretic Peptide: 16.8 pg/mL (ref 0.0–100.0)

## 2021-08-13 NOTE — ED Triage Notes (Signed)
Pt to ED for dizziness and being off balance for one week. States she walks like she is drunk. States has been having problems thinking of words but that is normal for her.  Denies weakness

## 2021-08-13 NOTE — ED Provider Notes (Signed)
Emergency Medicine Provider Triage Evaluation Note  Sabrina Lucas , a 65 y.o. female  was evaluated in triage.  Patient has a history of coronary artery disease and prior MI, presents to the emergency department with left-sided weakness.  Patient states that the symptoms have occurred for the past 1 to 2 weeks but she states that they are progressively worsening in intensity and causing her to fall at home.  She denies chest pain or chest tightness.  Denies current headache.  Denies experiencing similar symptoms in the past.  Review of Systems  Positive: Patient has left sided weakness.  Negative: No chest pain, chest tightness or abdominal pain.   Physical Exam  There were no vitals taken for this visit. Gen:   Awake, no distress   Resp:  Normal effort  MSK:   Moves extremities without difficulty  Other:    Medical Decision Making  Medically screening exam initiated at 3:38 PM.  Appropriate orders placed.  Sabrina Lucas was informed that the remainder of the evaluation will be completed by another provider, this initial triage assessment does not replace that evaluation, and the importance of remaining in the ED until their evaluation is complete.     Pia Mau [place], PA-C 08/13/21 1541    Shaune Pollack, MD 08/14/21 [phone]

## 2021-08-14 ENCOUNTER — Emergency Department
Admission: EM | Admit: 2021-08-14 | Discharge: 2021-08-14 | Disposition: A | Discharge status: Home / Self Care | Payer: Medicare Other | Attending: Emergency Medicine | Admitting: Emergency Medicine

## 2021-08-14 ENCOUNTER — Emergency Department: Payer: Medicare Other

## 2021-08-14 DIAGNOSIS — R42 Dizziness and giddiness: Secondary | ICD-10-CM

## 2021-08-14 DIAGNOSIS — N390 Urinary tract infection, site not specified: Secondary | ICD-10-CM

## 2021-08-14 LAB — URINALYSIS, COMPLETE (UACMP) WITH MICROSCOPIC
Glucose, UA: NEGATIVE mg/dL
Hgb urine dipstick: NEGATIVE
Ketones, ur: NEGATIVE mg/dL
Nitrite: NEGATIVE
Protein, ur: 30 mg/dL — AB
Specific Gravity, Urine: 1.027 (ref 1.005–1.030)
pH: 5 (ref 5.0–8.0)

## 2021-08-14 MED ORDER — DIAZEPAM 2 MG PO TABS
2.0000 mg | ORAL_TABLET | Freq: Once | ORAL | Status: AC
  Administered 2021-08-14: 2 mg via ORAL
  Filled 2021-08-14: qty 1

## 2021-08-14 MED ORDER — FOSFOMYCIN TROMETHAMINE 3 G PO PACK
3.0000 g | PACK | Freq: Once | ORAL | Status: AC
  Administered 2021-08-14: 3 g via ORAL
  Filled 2021-08-14: qty 3

## 2021-08-14 MED ORDER — ONDANSETRON 4 MG PO TBDP: 4.0000 mg | ORAL_TABLET | Freq: Three times a day (TID) | ORAL | 0 refills | Status: AC | PRN

## 2021-08-14 MED ORDER — MECLIZINE HCL 25 MG PO TABS: 25.0000 mg | ORAL_TABLET | Freq: Three times a day (TID) | ORAL | 0 refills | Status: AC | PRN

## 2021-08-14 NOTE — Discharge Instructions (Signed)
1.  You may take medicines as needed for dizziness and nausea (Meclizine/Zofran #20). °2.  Return to the ER for worsening symptoms, persistent vomiting, difficulty breathing or other concerns. °

## 2021-08-14 NOTE — ED Provider Notes (Signed)
[facility] Emergency Department Provider Note   ____________________________________________   Event Date/Time   First MD Initiated Contact with Patient 08/14/21 0335     (approximate)  I have reviewed the triage vital signs and the nursing notes.   HISTORY  Chief Complaint Dizziness    HPI Sabrina Lucas is a 65 y.o. female who presents to the ED from home with a chief complaint of dizziness.  Patient with a history of CAD, diabetes, fibromyalgia, forgetfulness for which she is on oxygen (not for respiratory issues, per her report).  Reports a 1 week history of dizziness and feeling off balance like she is drunk.  Has also been having problems thinking of words but that is normal for her.  Denies slurred speech, facial droop, extremity weakness, numbness or tingling.  Denies fever, cough, chest pain, shortness of breath, abdominal pain, vomiting or diarrhea.      Past Medical History:  Diagnosis Date   Anxiety    Asthma    CAD (coronary artery disease)    Depression    Diabetes mellitus without complication (HCC)    Type II   DOE (dyspnea on exertion)    Fibromyalgia    GERD (gastroesophageal reflux disease)    Grade II diastolic dysfunction    History of kidney stones    passed 3 kidney stones this week (02/02/21)   Hyperlipidemia    Hypertension    Myocardial infarction (HCC) 2005   1 stent   On supplemental oxygen therapy    Pancreatitis 2020   PTSD (post-traumatic stress disorder)    Sleep apnea    no cpap. on back order x 2.5 years!! uses nasal prongs at night    Patient Active Problem List   Diagnosis Date Noted   Urge incontinence 08/13/2018   Anxiety 07/30/2018   Asthma 07/30/2018   Depression 07/30/2018   Fibromyalgia 07/30/2018   Heart disease 07/30/2018   Panic disorder 07/30/2018   Post traumatic stress disorder (PTSD) 07/30/2018   Hypertension 07/30/2018   Chest pain 08/07/2012   Myocardial infarction (HCC) 10/14/2006     Past Surgical History:  Procedure Laterality Date   ABDOMINAL HYSTERECTOMY     partial   ACHILLES TENDON SURGERY Right 02/08/2021   Procedure: ACHILLES TENDON REPAIR, SECONDARY- RIGHT;  Surgeon: Rosetta PosnerBaker, Andrew, DPM;  Location: [facility] ORS;  Service: Podiatry;  Laterality: Right;   CARDIAC CATHETERIZATION  2005   1 stent   CARDIAC SURGERY  2005   stent x 1   CHOLECYSTECTOMY  2016   GASTROC RECESSION EXTREMITY Right 02/08/2021   Procedure: GASTROC RECESSION- RIGHT;  Surgeon: Rosetta PosnerBaker, Andrew, DPM;  Location: [facility] ORS;  Service: Podiatry;  Laterality: Right;   MOUTH SURGERY  2003   teeth removed in their entirety   OSTECTOMY Right 02/08/2021   Procedure: OSTECTOMY- HAGLUNDS/RETROCALCANEAL;  Surgeon: Rosetta PosnerBaker, Andrew, DPM;  Location: [facility] ORS;  Service: Podiatry;  Laterality: Right;   pancreas stent  06/2019   TENDON TRANSFER Right 02/08/2021   Procedure: FHL TRANSFER; DEEP- RIGHT;  Surgeon: Rosetta PosnerBaker, Andrew, DPM;  Location: [facility] ORS;  Service: Podiatry;  Laterality: Right;   TONSILLECTOMY  1983    Prior to Admission medications   Medication Sig Start Date End Date Taking? Authorizing Provider  meclizine (ANTIVERT) 25 MG tablet Take 1 tablet (25 mg total) by mouth 3 (three) times daily as needed for dizziness or nausea. 08/14/21  Yes Irean HongSung, Emonee Winkowski J, MD  ondansetron (ZOFRAN ODT) 4 MG disintegrating tablet Take 1 tablet (4  mg total) by mouth every 8 (eight) hours as needed for nausea or vomiting. 08/14/21  Yes Paulette Blanch, MD  acetaminophen (TYLENOL) 500 MG tablet Take 1,000 mg by mouth every 6 (six) hours as needed.    [provider]  albuterol (PROVENTIL) (2.5 MG/3ML) 0.083% nebulizer solution Take 2.5 mg by nebulization every 6 (six) hours as needed for wheezing or shortness of breath.    [provider]  albuterol (VENTOLIN HFA) 108 (90 Base) MCG/ACT inhaler Inhale 1-2 puffs into the lungs every 6 (six) hours as needed for wheezing or shortness of breath.    [provider]   amLODipine (NORVASC) 10 MG tablet Take 10 mg by mouth daily. 11/16/20   [provider]  ARIPiprazole (ABILIFY) 2 MG tablet Take 4 mg by mouth daily. 01/11/21   [provider]  aspirin 81 MG EC tablet Take 1 tablet (81 mg total) by mouth daily. 02/09/21   Caroline More, DPM  azithromycin (ZITHROMAX) 250 MG tablet Take 1 tablet (250 mg total) by mouth daily. Take first 2 tablets together, then 1 every day until finished. Patient not taking: No sig reported 12/11/20   Verda Cumins, MD  buPROPion [facility] SR) 200 MG 12 hr tablet Take 200 mg by mouth 2 (two) times daily. 10/25/20   [provider]  Cholecalciferol 50 MCG (2000 UT) CAPS Take 6,000 Units by mouth daily. 05/26/20   [provider]  Cinnamon 500 MG capsule Take 500 mg by mouth daily.    [provider]  DULoxetine (CYMBALTA) 60 MG capsule Take 60 mg by mouth daily. 01/11/21   [provider]  Echinacea 400 MG CAPS Take by mouth.    [provider]  enoxaparin (LOVENOX) 40 MG/0.4ML injection Inject 0.4 mLs (40 mg total) into the skin daily. 02/09/21   Caroline More, DPM  fexofenadine (ALLEGRA) 180 MG tablet Take 180 mg by mouth daily. Patient not taking: Reported on 01/31/2021 11/14/20 11/14/21  [provider]  glimepiride (AMARYL) 1 MG tablet Take 1 mg by mouth daily before breakfast. 01/11/21   [provider]  Glucosamine-Chondroitin (GLUCOSAMINE CHONDR COMPLEX PO) Take 1,200 mg by mouth.    [provider]  hydrOXYzine (ATARAX/VISTARIL) 25 MG tablet Take 25 mg by mouth every 6 (six) hours as needed for anxiety. 07/30/18   [provider]  losartan (COZAAR) 100 MG tablet Take 100 mg by mouth daily. 12/21/20   [provider]  meloxicam (MOBIC) 15 MG tablet Take 15 mg by mouth daily. Patient not taking: Reported on 01/31/2021 01/05/21   [provider]  montelukast (SINGULAIR) 10 MG tablet Take 10 mg by mouth at bedtime. 01/11/21    [provider]  omeprazole (PRILOSEC) 20 MG capsule Take 20 mg by mouth 2 (two) times daily before a meal. 07/30/18   [provider]  orphenadrine (NORFLEX) 100 MG tablet Take 1 tablet (100 mg total) by mouth 2 (two) times daily as needed for muscle spasms. Patient not taking: No sig reported 08/16/18   Katy Apo, NP  OXYGEN Inhale into the lungs. 2 liters by nasal canula prn    [provider]  potassium chloride (KLOR-CON) 10 MEQ tablet Take 10 mEq by mouth daily. 01/11/21   [provider]  pregabalin (LYRICA) 75 MG capsule Take 75 mg by mouth in the morning, at noon, in the evening, and at bedtime. 01/11/21   [provider]  REPATHA 140 MG/ML SOSY Inject 140 mg into  the skin every 14 (fourteen) days. 12/19/20   [provider]  SYMBICORT 160-4.5 MCG/ACT inhaler Inhale 2 puffs into the lungs in the morning and at bedtime. 01/11/21   [provider]  torsemide (DEMADEX) 10 MG tablet Take 10 mg by mouth daily. 01/11/21   [provider]  zinc gluconate 50 MG tablet Take 100 mg by mouth daily.    [provider]    Allergies Iodine, Percocet [oxycodone-acetaminophen], Statins, Sulfa antibiotics, and Tape  Family History  Problem Relation Age of Onset   Diabetes Mother    Hypertension Mother    Heart disease Father     Social History Social History   Tobacco Use   Smoking status: Former    Types: Cigarettes    Quit date: 07/30/2020    Years since quitting: 1.0   Smokeless tobacco: Never  Vaping Use   Vaping Use: Former   Quit date: 01/28/2018  Substance Use Topics   Alcohol use: Yes    Comment: occasionally   Drug use: Never    Review of Systems  Constitutional: No fever/chills Eyes: No visual changes. ENT: No sore throat. Cardiovascular: Denies chest pain. Respiratory: Denies shortness of breath. Gastrointestinal: No abdominal pain.  No nausea, no vomiting.  No diarrhea.  No  constipation. Genitourinary: Negative for dysuria. Musculoskeletal: Negative for back pain. Skin: Negative for rash. Neurological: Positive for dizziness.  Negative for headaches, focal weakness or numbness.   ____________________________________________   PHYSICAL EXAM:  VITAL SIGNS: ED Triage Vitals  Enc Vitals Group     BP 08/13/21 1601 102/70     Pulse Rate 08/13/21 1601 93     Resp 08/13/21 1558 20     Temp 08/13/21 1558 98.3 F (36.8 C)     Temp Source 08/13/21 1558 Oral     SpO2 08/13/21 1601 96 %     Weight 08/13/21 1559 280 lb (127 kg)     Height 08/13/21 1559 5\' 7"  (1.702 m)     Head Circumference --      Peak Flow --      Pain Score 08/13/21 1558 0     Pain Loc --      Pain Edu? --      Excl. in [place]? --     Constitutional: Alert and oriented. Well appearing and in no acute distress. Eyes: Conjunctivae are normal. PERRL. EOMI. Head: Atraumatic. Nose: No congestion/rhinnorhea. Mouth/Throat: Mucous membranes are moist.   Neck: No stridor.  Supple neck without meningismus.  No carotid bruits. Cardiovascular: Normal rate, regular rhythm. Grossly normal heart sounds.  Good peripheral circulation. Respiratory: Normal respiratory effort.  No retractions. Lungs CTAB. Gastrointestinal: Soft and nontender. No distention. No abdominal bruits. No CVA tenderness. Musculoskeletal: No lower extremity tenderness nor edema.  No joint effusions. Neurologic: Alert and oriented x3.  CN II to XII grossly intact.  Normal speech and language. No gross focal neurologic deficits are appreciated.  Intermittent forgetfulness. Skin:  Skin is warm, dry and intact. No rash noted.  No petechiae. Psychiatric: Mood and affect are normal. Speech and behavior are normal.  ____________________________________________   LABS (all labs ordered are listed, but only abnormal results are displayed)  Labs Reviewed  COMPREHENSIVE METABOLIC PANEL - Abnormal; Notable for the following components:       Result Value   Glucose, Bld 147 (*)    All other components within normal limits  URINALYSIS, COMPLETE (UACMP) WITH MICROSCOPIC - Abnormal; Notable for the following components:   Color,  Urine AMBER (*)    APPearance CLOUDY (*)    Bilirubin Urine SMALL (*)    Protein, ur 30 (*)    Leukocytes,Ua SMALL (*)    Bacteria, UA FEW (*)    All other components within normal limits  CBC WITH DIFFERENTIAL/PLATELET  BRAIN NATRIURETIC PEPTIDE  TROPONIN I (HIGH SENSITIVITY)  TROPONIN I (HIGH SENSITIVITY)   ____________________________________________  EKG  ED ECG REPORT I, Kimmberly Wisser J, the attending physician, personally viewed and interpreted this ECG.   Date: 08/14/2021  EKG Time: 1546  Rate: 89  Rhythm: normal sinus rhythm  Axis: Normal  Intervals:none  ST&T Change: Nonspecific  ____________________________________________  RADIOLOGY I, Shadaya Marschner J, personally viewed and evaluated these images (plain radiographs) as part of my medical decision making, as well as reviewing the written report by the radiologist.  ED MD interpretation: No acute cardiopulmonary process; no ICH, MRI brain unremarkable  Official radiology report(s): DG Chest 2 View  Result Date: 08/13/2021 CLINICAL DATA:  Shortness of breath and dizziness. EXAM: CHEST - 2 VIEW COMPARISON:  Chest radiograph 12/12/2020 FINDINGS: The cardiomediastinal contours are normal. Mild bronchovascular crowding related to low lung volumes. No pulmonary edema. No consolidation, pleural effusion, or pneumothorax. No acute osseous abnormalities are seen. IMPRESSION: Low lung volumes without acute abnormality. Electronically Signed   By: Narda Rutherford M.D.   On: 08/13/2021 16:30   CT Head Wo Contrast  Result Date: 08/13/2021 CLINICAL DATA:  Dizziness, non-specific EXAM: CT HEAD WITHOUT CONTRAST TECHNIQUE: Contiguous axial images were obtained from the base of the skull through the vertex without intravenous contrast. COMPARISON:   None. FINDINGS: Brain: There is no acute intracranial hemorrhage, mass effect, or edema. No acute appearing loss of gray-white differentiation. Age-indeterminate small vessel infarcts of the inferior left caudate head and adjacent white matter. There is no extra-axial fluid collection. Ventricles and sulci are within normal limits in size and configuration. Vascular: There is atherosclerotic calcification at the skull base. Skull: Calvarium is unremarkable. Sinuses/Orbits: No acute finding. Other: None. IMPRESSION: No acute intracranial hemorrhage. Age-indeterminate small vessel infarcts of the inferior left caudate head and adjacent white matter. Electronically Signed   By: Guadlupe Spanish M.D.   On: 08/13/2021 16:39   MR BRAIN WO CONTRAST  Result Date: 08/14/2021 CLINICAL DATA:  Dizziness and memory loss EXAM: MRI HEAD WITHOUT CONTRAST TECHNIQUE: Multiplanar, multiecho pulse sequences of the brain and surrounding structures were obtained without intravenous contrast. COMPARISON:  None. FINDINGS: Brain: No acute infarct, mass effect or extra-axial collection. No acute or chronic hemorrhage. There is multifocal hyperintense T2-weighted signal within the white matter. Generalized volume loss without a clear lobar predilection. The midline structures are normal. Vascular: Major flow voids are preserved. Skull and upper cervical spine: Normal calvarium and skull base. Visualized upper cervical spine and soft tissues are normal. Sinuses/Orbits:Small amount of left mastoid fluid. Paranasal sinuses are clear. Normal orbits. IMPRESSION: 1. No acute intracranial abnormality. 2. Generalized volume loss without a clear lobar predilection. 3. Findings of chronic microvascular ischemia. Electronically Signed   By: Deatra Robinson M.D.   On: 08/14/2021 01:50    ____________________________________________   PROCEDURES  Procedure(s) performed (including Critical Care):  Procedures  NIH Stroke Scale  Interval:  Baseline Time: 5:57 AM Person Administering Scale: Margaretta Chittum J  Administer stroke scale items in the order listed. Record performance in each category after each subscale exam. Do not go back and change scores. Follow directions provided for each exam technique. Scores should reflect what the patient does, not  what the clinician thinks the patient can do. The clinician should record answers while administering the exam and work quickly. Except where indicated, the patient should not be coached (i.e., repeated requests to patient to make a special effort).   1a  Level of consciousness: 0=alert; keenly responsive  1b. LOC questions:  0=Performs both tasks correctly  1c. LOC commands: 0=Performs both tasks correctly  2.  Best Gaze: 0=normal  3.  Visual: 0=No visual loss  4. Facial Palsy: 0=Normal symmetric movement  5a.  Motor left arm: 0=No drift, limb holds 90 (or 45) degrees for full 10 seconds  5b.  Motor right arm: 0=No drift, limb holds 90 (or 45) degrees for full 10 seconds  6a. motor left leg: 0=No drift, limb holds 90 (or 45) degrees for full 10 seconds  6b  Motor right leg:  0=No drift, limb holds 90 (or 45) degrees for full 10 seconds  7. Limb Ataxia: 0=Absent  8.  Sensory: 0=Normal; no sensory loss  9. Best Language:  0=No aphasia, normal  10. Dysarthria: 0=Normal  11. Extinction and Inattention: 0=No abnormality  12. Distal motor function: 0=Normal   Total:   0    ____________________________________________   INITIAL IMPRESSION / ASSESSMENT AND PLAN / ED COURSE  As part of my medical decision making, I reviewed the following data within the [place] notes reviewed and incorporated, Labs reviewed, EKG interpreted, Old chart reviewed, Radiograph reviewed, and Notes from prior ED visits     65 year old female presenting with dizziness.  Differential diagnosis includes but is not limited to TIA, CVA, CAD, BPV, infectious, electrolyte abnormalities,  etc.  Laboratory results unremarkable including EKG and 2 sets of negative troponins.  UA demonstrates mild UTI.  NIH stroke scale 0.  Patient had unremarkable imaging of CT head and MRI brain.  Received Valium for anxiolytic for MRI; dizziness improved.  Dose fosfomycin prior to discharge.  Will discharge home with prescriptions for meclizine and Zofran to use as needed.  Strict return precautions given.  Patient verbalizes understanding agrees with plan of care.      ____________________________________________   FINAL CLINICAL IMPRESSION(S) / ED DIAGNOSES  Final diagnoses:  Dizziness  Lower urinary tract infectious disease     ED Discharge Orders          Ordered    meclizine (ANTIVERT) 25 MG tablet  3 times daily PRN        08/14/21 0339    ondansetron (ZOFRAN ODT) 4 MG disintegrating tablet  Every 8 hours PRN        08/14/21 [id number]             Note:  This document was prepared using Dragon voice recognition software and may include unintentional dictation errors.    Paulette Blanch, MD 08/14/21 [phone]

## 2021-08-29 DIAGNOSIS — R1013 Epigastric pain: Principal | ICD-10-CM

## 2021-08-29 DIAGNOSIS — F419 Anxiety disorder, unspecified: Principal | ICD-10-CM

## 2021-08-29 MED ORDER — MECLIZINE 25 MG TABLET
ORAL_TABLET | Freq: Three times a day (TID) | ORAL | 0 refills | 10.00000 days | Status: CP | PRN
Start: 2021-08-29 — End: ?

## 2021-08-29 MED ORDER — OMEPRAZOLE 20 MG CAPSULE,DELAYED RELEASE
ORAL_CAPSULE | Freq: Two times a day (BID) | ORAL | 1 refills | 90.00000 days | Status: CP
Start: 2021-08-29 — End: ?

## 2021-08-29 MED ORDER — HYDROXYZINE HCL 25 MG TABLET
ORAL_TABLET | 0 refills | 0 days | Status: CP
Start: 2021-08-29 — End: ?

## 2021-08-29 NOTE — Unmapped (Signed)
Patient is requesting the following refill  Requested Prescriptions      No prescriptions requested or ordered in this encounter       Recent Visits  No visits were found meeting these conditions.  Showing recent visits within past 365 days with a meds authorizing provider and meeting all other requirements  Future Appointments  No visits were found meeting these conditions.  Showing future appointments within next 365 days with a meds authorizing provider and meeting all other requirements       Labs: Not applicable this refill

## 2021-09-04 MED ORDER — AMLODIPINE 10 MG TABLET
ORAL_TABLET | Freq: Every day | ORAL | 2 refills | 90 days
Start: 2021-09-04 — End: 2022-09-04

## 2021-09-04 NOTE — Unmapped (Signed)
Patient is requesting the following refill  Requested Prescriptions     Pending Prescriptions Disp Refills   ??? amLODIPine (NORVASC) 10 MG tablet 90 tablet 2     Sig: Take 1 tablet (10 mg total) by mouth daily.       Recent Visits  No visits were found meeting these conditions.  Showing recent visits within past 365 days with a meds authorizing provider and meeting all other requirements  Future Appointments  No visits were found meeting these conditions.  Showing future appointments within next 365 days with a meds authorizing provider and meeting all other requirements       Patient needs an appointment for further refills.  Please call and schedule

## 2021-09-05 MED ORDER — AMLODIPINE 10 MG TABLET
ORAL_TABLET | Freq: Every day | ORAL | 2 refills | 90 days | Status: CP
Start: 2021-09-05 — End: 2022-09-05

## 2021-09-10 NOTE — Unmapped (Signed)
The [facility] has made a second and final attempt to reach this patient to refill the following medication:Repatha.      We have left voicemails on the following phone numbers: [employer].    Dates contacted: 09/04/21, 09/10/21  Last scheduled delivery: 06/21/21    The patient may be at risk of non-compliance with this medication. The patient should call the [facility] at [phone]  Option 4, then Option 2 (all other specialty patients) to refill medication.    Brenson Hartman Celedonio Savage   [facility]

## 2021-09-17 ENCOUNTER — Other Ambulatory Visit: Payer: Self-pay | Admitting: Gerontology

## 2021-09-17 DIAGNOSIS — Z1231 Encounter for screening mammogram for malignant neoplasm of breast: Secondary | ICD-10-CM

## 2021-10-07 LAB — COMPREHENSIVE METABOLIC PANEL
ALBUMIN: 3.5 g/dL (ref 3.4–5.0)
ALKALINE PHOSPHATASE: 118 U/L — ABNORMAL HIGH (ref 46–116)
ALT (SGPT): 28 U/L (ref 10–49)
ANION GAP: 7 mmol/L (ref 5–14)
AST (SGOT): 15 U/L (ref <=34)
BILIRUBIN TOTAL: 0.2 mg/dL — ABNORMAL LOW (ref 0.3–1.2)
BLOOD UREA NITROGEN: 14 mg/dL (ref 9–23)
BUN / CREAT RATIO: 19
CALCIUM: 8.9 mg/dL (ref 8.7–10.4)
CHLORIDE: 107 mmol/L (ref 98–107)
CO2: 26 mmol/L (ref 20.0–31.0)
CREATININE: 0.73 mg/dL
EGFR CKD-EPI (2021) FEMALE: >90 mL/min/{1.73_m2} (ref >=60)
GLUCOSE RANDOM: 164 mg/dL (ref 70–179)
POTASSIUM: 3.7 mmol/L (ref 3.4–4.8)
PROTEIN TOTAL: 7.7 g/dL (ref 5.7–8.2)
SODIUM: 140 mmol/L (ref 135–145)

## 2021-10-07 LAB — CBC W/ AUTO DIFF
BASOPHILS ABSOLUTE COUNT: 0.1 10*9/L (ref 0.0–0.1)
BASOPHILS RELATIVE PERCENT: 0.9 %
EOSINOPHILS ABSOLUTE COUNT: 0.2 10*9/L (ref 0.0–0.5)
EOSINOPHILS RELATIVE PERCENT: 2.1 %
HEMATOCRIT: 36.8 % (ref 34.0–44.0)
HEMOGLOBIN: 12 g/dL (ref 11.3–14.9)
LYMPHOCYTES ABSOLUTE COUNT: 3 10*9/L (ref 1.1–3.6)
LYMPHOCYTES RELATIVE PERCENT: 26.3 %
MEAN CORPUSCULAR HEMOGLOBIN CONC: 32.6 g/dL (ref 32.0–36.0)
MEAN CORPUSCULAR HEMOGLOBIN: 26.8 pg (ref 25.9–32.4)
MEAN CORPUSCULAR VOLUME: 82.3 fL (ref 77.6–95.7)
MEAN PLATELET VOLUME: 7.7 fL (ref 6.8–10.7)
MONOCYTES ABSOLUTE COUNT: 0.8 10*9/L (ref 0.3–0.8)
MONOCYTES RELATIVE PERCENT: 7.3 %
NEUTROPHILS ABSOLUTE COUNT: 7.3 10*9/L (ref 1.8–7.8)
NEUTROPHILS RELATIVE PERCENT: 63.4 %
NUCLEATED RED BLOOD CELLS: 0 /100{WBCs} (ref <=4)
PLATELET COUNT: 293 10*9/L (ref 150–450)
RED BLOOD CELL COUNT: 4.47 10*12/L (ref 3.95–5.13)
RED CELL DISTRIBUTION WIDTH: 15.4 % — ABNORMAL HIGH (ref 12.2–15.2)
WBC ADJUSTED: 11.5 10*9/L — ABNORMAL HIGH (ref 3.6–11.2)

## 2021-10-07 LAB — LIPASE: LIPASE: 89 U/L — ABNORMAL HIGH (ref 12–53)

## 2021-10-08 ENCOUNTER — Ambulatory Visit: Admit: 2021-10-08 | Discharge: 2021-10-08 | Disposition: A | Discharge status: Home / Self Care | Payer: MEDICARE

## 2021-10-08 LAB — URINALYSIS WITH CULTURE REFLEX
BACTERIA: NONE SEEN /HPF
BILIRUBIN UA: NEGATIVE
GLUCOSE UA: 300 — AB
KETONES UA: NEGATIVE
LEUKOCYTE ESTERASE UA: NEGATIVE
NITRITE UA: NEGATIVE
PH UA: 5.5 (ref 5.0–9.0)
RBC UA: 4 /HPF (ref <=4)
SPECIFIC GRAVITY UA: 1.024 (ref 1.003–1.030)
SQUAMOUS EPITHELIAL: 1 /HPF (ref 0–5)
UROBILINOGEN UA: <2
WBC UA: 2 /HPF (ref 0–5)

## 2021-10-08 MED ORDER — HYDROCODONE 5 MG-ACETAMINOPHEN 325 MG TABLET
ORAL_TABLET | ORAL | 0 refills | 2.00000 days | Status: CP | PRN
Start: 2021-10-08 — End: 2021-10-13

## 2021-10-08 MED ORDER — PROMETHAZINE 25 MG TABLET
ORAL_TABLET | Freq: Four times a day (QID) | ORAL | 0 refills | 8.00000 days | Status: CP | PRN
Start: 2021-10-08 — End: 2021-10-15

## 2021-10-08 MED ADMIN — ondansetron (ZOFRAN) injection 4 mg: 4 mg | INTRAVENOUS | @ 05:00:00 | Stop: 2021-10-08

## 2021-10-08 MED ADMIN — lactated ringers bolus 1,000 mL: 1000 mL | INTRAVENOUS | @ 05:00:00 | Stop: 2021-10-08

## 2021-10-08 MED ADMIN — ondansetron (ZOFRAN) injection 4 mg: 4 mg | INTRAVENOUS | @ 08:00:00 | Stop: 2021-10-08

## 2021-10-08 MED ADMIN — MORPhine 4 mg/mL injection 4 mg: 4 mg | INTRAVENOUS | @ 05:00:00 | Stop: 2021-10-08

## 2021-10-08 MED ADMIN — oxyCODONE (ROXICODONE) immediate release tablet 10 mg: 10 mg | ORAL | @ 08:00:00 | Stop: 2021-10-08

## 2021-10-08 MED ADMIN — HYDROmorphone (PF) (DILAUDID) injection 1 mg: 1 mg | INTRAVENOUS | @ 07:00:00 | Stop: 2021-10-08

## 2021-10-08 NOTE — Unmapped (Signed)
Patient presents to the ED epigastric pain that started yesterday getting progressively worse. H/O pancreatitis, +N/V

## 2021-10-08 NOTE — Unmapped (Signed)
ED Progress Note  Patient called and requested her prescriptions from yesterday evening be sent to the [facility] in [facility] as the pharmacy they were sent to his close today.  I have modified her 2 prescriptions from last night to be sent to the [facility] in [place].

## 2021-10-08 NOTE — Unmapped (Signed)
Emergency Department Provider Note        ED Clinical Impression     Final diagnoses:   Upper abdominal pain (Primary)   History of pancreatitis       ED Assessment/Plan   65 year old female with history of pancreatitis who presents with 2 days of upper abdominal pain.  Her labs show mildly elevated lipase which may be an indication of pancreatitis Dobin her severe pancreatitis episodes in the past.  No fever or elevated white count to suggest infectious process.  Patient without vomiting.  Will attempt for pain control along with some IV hydration.  If she is able to tolerate the pain, feel that she will be able to go home with short course of pain medication to follow-up with her primary care doctor and/or gastroenterologist.    5:17 AM  Patient has been able to have adequate pain control.  Will prescribe her Norco as she has tolerated this very well in the past.  Return precautions discussed    History     Chief Complaint   Patient presents with   ??? Abdominal Pain     HPI  65 yo female presents to the emergency department from home with complaint of epigastric abdominal pain that is worsened over the last 2 days.  Patient has history of pancreatitis, has had prior ERCPs with stenting and patch.  She reports no issues with similar pain since her last ERCP in 2020.  She has been sticking to a liquid diet, and denies vomiting, but has been feeling worse and worse.  Family reported that she had change in her color and looked off.  No fevers, no cough, no diarrhea.  She is having regular bowel movements.  Past Medical History:   Diagnosis Date   ??? Achilles tendinitis, right leg 2021   ??? Anxiety    ??? Arthritis     Adult life   ??? Asthma    ??? BPPV (benign paroxysmal positional vertigo)    ??? Chronic diastolic CHF (congestive heart failure) (CMS-HCC)    ??? COPD (chronic obstructive pulmonary disease) (CMS-HCC)    ??? Depression    ??? Fibromyalgia    ??? GERD (gastroesophageal reflux disease)     Adult life   ??? Heart disease ??? Heart murmur    ??? Hyperlipidemia    ??? Hypertension    ??? Myocardial infarction (CMS-HCC) 2008    s/p stent placement x 1 at [facility]   ??? Obesity    ??? Panic disorder    ??? Substance abuse (CMS-HCC)    ??? Urge incontinence        Past Surgical History:   Procedure Laterality Date   ??? cardiac stent x 1     ??? CHOLECYSTECTOMY     ??? HYSTERECTOMY  1990    one ovary remains   ??? PR COLORECTAL SCRN; HI RISK IND N/A 08/06/2019    Procedure: COLOREC CANCR SCR; COLNSCPY;  Surgeon: Charm Rings, MD;  Location: [facility];  Service: Gastroenterology   ??? PR ERCP REMOVE FOREIGN BODY/STENT BILIARY/PANC DUCT N/A 08/09/2019    Procedure: ENDOSCOPIC RETROGRADE CHOLANGIOPANCREATOGRAPHY (ERCP); W/ REMOVAL OF FOREIGN BODY/STENT FROM BILIARY/PANCREATIC DUCT(S);  Surgeon: Vonda Antigua, MD;  Location: [facility];  Service: Gastroenterology   ??? PR ERCP STENT PLACEMENT BILIARY/PANCREATIC DUCT N/A 06/28/2019    Procedure: ENDOSCOPIC RETROGRADE CHOLANGIOPANCREATOGRAPHY (ERCP); WITH PLACEMENT OF ENDOSCOPIC STENT INTO BILIARY OR PANCREATIC DUCT;  Surgeon: Vonda Antigua, MD;  Location: [facility];  Service: Gastroenterology   ??? PR ERCP,SPHINCTEROTOMY N/A 11/12/2018    Procedure: ERCP; W/SPHINCTEROTOMY/PAPILLOTOMY;  Surgeon: Vonda Antigua, MD;  Location: [facility];  Service: Gastroenterology   ??? PR ERCP,W/REMOVAL STONE,BIL/PANCR DUCTS N/A 11/12/2018    Procedure: ERCP; W/ENDOSCOPIC RETROGRADE REMOVAL OF CALCULUS/CALCULI FROM BILIARY &/OR PANCREATIC DUCTS;  Surgeon: Vonda Antigua, MD;  Location: [facility];  Service: Gastroenterology   ??? TONSILLECTOMY     ??? TUBAL LIGATION  1990    Tubal pregnacy       Family History   Problem Relation Age of Onset   ??? Heart disease Father         Deacesed   ??? Coronary artery disease Father         quadruple bypass   ??? Mental illness Father    ??? Asthma Father    ??? COPD Father    ??? Depression Father    ??? Hypertension Father    ??? Cancer Sister    ??? Arthritis Daughter    ??? Depression Daughter    ??? Asthma Sister    ??? Cancer Sister         Colan cancer deceased   ??? COPD Sister    ??? Depression Sister    ??? Early death Sister         21 yrs old   ??? Cancer Paternal Aunt         Thyroid cancer   ??? Cancer Maternal Grandmother         Brain cancer   ??? Depression Mother    ??? Diabetes Mother    ??? Hypertension Mother    ??? Diabetes Brother    ??? Vision loss Maternal Aunt         Blind at birth   ??? Substance Abuse Disorder Neg Hx    ??? Alcohol abuse Neg Hx    ??? Drug abuse Neg Hx        Social History     Socioeconomic History   ??? Marital status: Widowed   Tobacco Use   ??? Smoking status: Every Day     Years: 25.00     Types: Cigarettes     Last attempt to quit: 05/02/2019     Years since quitting: 2.4   ??? Smokeless tobacco: Never   ??? Tobacco comments:     Pt smokes  1.5cpd. Pt expressed desire to quit    Vaping Use   ??? Vaping Use: Never used   Substance and Sexual Activity   ??? Alcohol use: Yes   ??? Drug use: Not Currently     Comment: not used cocaine in 13 years   ??? Sexual activity: Not Currently     Partners: Male     Birth control/protection: Surgical     Comment: I have no desire but want too       Review of Systems  10+ point review of systems negative except as detailed in HPI.      Physical Exam     BP 122/60  - Pulse 102  - Temp 37.6 ??C (99.7 ??F) (Oral)  - Resp 20  - SpO2 92%     Physical Exam  Vitals and nursing note reviewed.   Constitutional:       General: She is not in acute distress.     Appearance: She is well-developed.   HENT:      Head: Normocephalic and atraumatic.  Nose: Nose normal.      Mouth/Throat:      Pharynx: No oropharyngeal exudate.   Eyes:      Conjunctiva/sclera: Conjunctivae normal.      Pupils: Pupils are equal, round, and reactive to light.   Neck:      Thyroid: No thyromegaly.      Vascular: No JVD.      Trachea: No tracheal deviation.   Cardiovascular:      Rate and Rhythm: Normal rate and regular rhythm.      Heart sounds: Normal heart sounds. No murmur heard.    No friction rub. No gallop.   Pulmonary:      Effort: Pulmonary effort is normal. No respiratory distress.      Breath sounds: Normal breath sounds. No stridor. No wheezing or rales.   Chest:      Chest wall: No tenderness.   Abdominal:      General: Bowel sounds are normal. There is no distension.      Palpations: Abdomen is soft. There is no mass.      Tenderness: There is abdominal tenderness in the epigastric area. There is no guarding or rebound.   Musculoskeletal:         General: No tenderness. Normal range of motion.      Cervical back: Normal range of motion and neck supple.   Lymphadenopathy:      Cervical: No cervical adenopathy.   Skin:     General: Skin is warm and dry.      Coloration: Skin is not pale.      Findings: No erythema or rash.   Neurological:      Mental Status: She is alert and oriented to person, place, and time.      Motor: No abnormal muscle tone.      Coordination: Coordination normal.   Psychiatric:         Behavior: Behavior normal.         Thought Content: Thought content normal.         Judgment: Judgment normal.         ED Course            MDM  Reviewed: previous chart, nursing note and vitals  Reviewed previous: labs and ultrasound  Interpretation: labs         Desma Mcgregor, MD  10/08/21 [phone]

## 2021-11-20 NOTE — Unmapped (Signed)
Patient states that she is no longer a patient here at [facility].  Her new provider is Lorenso Quarry, NP

## 2021-12-25 NOTE — Unmapped (Signed)
Specialty Medication(s): Repatha    Ms.Vinal has been dis-enrolled from the [facility] specialty pharmacy services due to multiple unsuccessful outreach attempts by the pharmacy.    Additional information provided to the patient: n/a    Camillo Flaming  [facility] Specialty Pharmacist

## 2021-12-31 ENCOUNTER — Other Ambulatory Visit: Payer: Self-pay | Admitting: Physical Medicine & Rehabilitation

## 2021-12-31 DIAGNOSIS — G8929 Other chronic pain: Secondary | ICD-10-CM

## 2022-01-08 ENCOUNTER — Other Ambulatory Visit: Payer: Self-pay

## 2022-01-08 ENCOUNTER — Ambulatory Visit
Admission: RE | Admit: 2022-01-08 | Discharge: 2022-01-08 | Disposition: A | Discharge status: Home / Self Care | Payer: 59 | Source: Ambulatory Visit | Attending: Physical Medicine & Rehabilitation | Admitting: Physical Medicine & Rehabilitation

## 2022-01-08 DIAGNOSIS — M5441 Lumbago with sciatica, right side: Secondary | ICD-10-CM | POA: Diagnosis not present

## 2022-01-08 DIAGNOSIS — G8929 Other chronic pain: Secondary | ICD-10-CM | POA: Insufficient documentation

## 2022-01-24 ENCOUNTER — Other Ambulatory Visit: Payer: Self-pay | Admitting: Gerontology

## 2022-01-24 ENCOUNTER — Other Ambulatory Visit (HOSPITAL_COMMUNITY): Payer: Self-pay | Admitting: Gerontology

## 2022-01-24 DIAGNOSIS — N939 Abnormal uterine and vaginal bleeding, unspecified: Secondary | ICD-10-CM

## 2022-01-29 ENCOUNTER — Ambulatory Visit
Admission: RE | Admit: 2022-01-29 | Discharge: 2022-01-29 | Disposition: A | Discharge status: Home / Self Care | Payer: 59 | Source: Ambulatory Visit | Attending: Gerontology | Admitting: Gerontology

## 2022-01-29 ENCOUNTER — Other Ambulatory Visit: Payer: Self-pay

## 2022-01-29 DIAGNOSIS — N939 Abnormal uterine and vaginal bleeding, unspecified: Secondary | ICD-10-CM | POA: Insufficient documentation

## 2022-04-25 ENCOUNTER — Other Ambulatory Visit: Payer: Self-pay | Admitting: Gerontology

## 2022-04-25 DIAGNOSIS — N83202 Unspecified ovarian cyst, left side: Secondary | ICD-10-CM

## 2022-04-25 DIAGNOSIS — N858 Other specified noninflammatory disorders of uterus: Secondary | ICD-10-CM

## 2022-04-25 DIAGNOSIS — N939 Abnormal uterine and vaginal bleeding, unspecified: Secondary | ICD-10-CM

## 2022-04-29 MED ORDER — EVOLOCUMAB 140 MG/ML SUBCUTANEOUS SYRINGE
SUBCUTANEOUS | 3 refills | 84 days
Start: 2022-04-29 — End: ?

## 2022-04-29 MED ORDER — REPATHA SYRINGE 140 MG/ML SUBCUTANEOUS SYRINGE
3 refills | 0 days
Start: 2022-04-29 — End: ?

## 2022-04-30 DIAGNOSIS — E785 Hyperlipidemia, unspecified: Principal | ICD-10-CM

## 2022-04-30 DIAGNOSIS — I2510 Atherosclerotic heart disease of native coronary artery without angina pectoris: Principal | ICD-10-CM

## 2022-04-30 NOTE — Unmapped (Signed)
[facility] Specialty Medication Onboarding    Specialty Medication: REPATHA SYRINGE 140 mg/mL Syrg (evolocumab)  Prior Authorization: Approved   Financial Assistance: No - copay  <$25  Final Copay/Day Supply: $0 / 84    Insurance Restrictions: None     Notes to Pharmacist:     The triage team has completed the benefits investigation and has determined that the patient is able to fill this medication at [facility]. Please contact the patient to complete the onboarding or follow up with the prescribing physician as needed.

## 2022-05-01 NOTE — Unmapped (Signed)
[facility]   Patient Onboarding/Medication Counseling    Ms.Stranger is a 66 y.o. female with hyperlipidemia who I am counseling today on re-initiation of therapy.  I am speaking to the patient.    Was a Nurse, learning disability used for this call? No    Verified patient's date of birth / HIPAA.    Specialty medication(s) to be sent: General Specialty: Repatha      Non-specialty medications/supplies to be sent: none at this time      Medications not needed at this time: n/a         Repatha (evolocumab)    The patient declined counseling on medication administration, missed dose instructions, goals of therapy, side effects and monitoring parameters, warnings and precautions, drug/food interactions, and storage, handling precautions, and disposal because they have taken the medication previously. The information in the declined sections below are for informational purposes only and was not discussed with patient.       Medication & Administration     Dosage: Inject the contents of 1 syringe (140mg ) under the skin every 2 weeks.    Administration: Administer under the skin of the abdomen, thigh or upper arm. Rotate sites with each injection.  Injection instructions - Syringe:  Remove 1 Repatha syringe and let stand at room temperature for at least 30 minutes.  Check the syringe for the following:  Expiration date  Absence of any cracks or damage  The medicine is clear and colorless and does not contain any particles  Choose your injection site and clean with an alcohol wipe. Allow to air dry completely.  Pull the gray needle cap straight off and dispose of it  Pinch the skin (or stretch) with your thumb and fingers creating an area 2 inches wide  Maintaining the pinch (or stretch) insert the needle at a 45 to 90 degree angle  When you are ready to inject, slowly and steadily press the plunger rod down all the way until the syringe is empty  When done, release your thumb and gently lift the syringe straight off the skin  Dispose of the syringe in a sharps container. Do not try to recap the syringe with the gray needle cap.  If there is blood at the injection site, press a cotton ball or gauze to the site. Do not rub the injection site.      Adherence/Missed dose instructions: Administer a missed dose within 7 days and resume your normal schedule.  If it has been more than 7 days and you inject every 2 weeks, skip the missed dose and resume your normal schedule..     Goals of Therapy     Lower cholesterol, prevention of cardiovascular events in patients with established cardiovascular disease    Side Effects & Monitoring Parameters   Flu-like symptoms  Signs of a common cold  Back pain  Injection site irritation  Nose or throat irritation    The following side effects should be reported to the provider:  Signs of an allergic reaction  Signs of high blood sugar (confusion, drowsiness, increase thirst/hunger/urination, fast breathing, flushing)      Contraindications, Warnings, & Precautions     Latex (the packaging of Repatha may contain natural rubber)    Drug/Food Interactions     Medication list reviewed in Epic. The patient was instructed to inform the care team before taking any new medications or supplements. No drug interactions identified.     Storage, Handling Precautions, & Disposal  Repatha should be stored in the refrigerator. If necessary, Repatha may be kept at room temperature for no more than 30 days.  Place used devices in a sharps container for disposal.      Current Medications (including OTC/herbals), Comorbidities and Allergies     Current Outpatient Medications   Medication Sig Dispense Refill    albuterol 2.5 mg /3 mL (0.083 %) nebulizer solution Inhale the contents of 1 vial (2.5 mg total) by nebulization every four (4) hours as needed for wheezing. 150 mL 1    albuterol HFA 90 mcg/actuation inhaler Inhale 2 puffs every four (4) hours as needed for wheezing. 18 g 1    amLODIPine (NORVASC) 10 MG tablet Take 1 tablet (10 mg total) by mouth daily. 90 tablet 2    aspirin (ECOTRIN) 81 MG tablet Take 81 mg by mouth daily.      buPROPion (WELLBUTRIN SR) 150 MG 12 hr tablet Take 1 tablet (150 mg total) by mouth Two (2) times a day. (Patient not taking: Reported on 10/07/2021) 60 tablet 3    citalopram (CELEXA) 40 MG tablet TAKE (1) TABLET BY MOUTH EVERY DAY (Patient not taking: Reported on 10/07/2021) 90 tablet 3    empty container (SHARPS CONTAINER) Misc Use as directed. 1 each 3    empty container Misc Use as directed to dispose of injectable medications 1 each 3    evolocumab (REPATHA SYRINGE) 140 mg/mL Syrg Inject the contents of 1 syringe (140 mg) under the skin every fourteen (14) days. (Patient not taking: Reported on 10/07/2021) 6 mL 3    evolocumab (REPATHA SYRINGE) 140 mg/mL Syrg Inject the contents of 1 syringe (140 mg) under the skin every fourteen (14) days. 6 mL 3    gabapentin (NEURONTIN) 100 MG capsule  (Patient not taking: Reported on 02/08/2020)      glimepiride (AMARYL) 4 MG tablet Take 4 mg by mouth daily before breakfast.      hydrOXYzine (ATARAX) 25 MG tablet Take 1 Tablet by mouth every 6 hours as needed for anxiety and congestion 30 tablet 0    losartan (COZAAR) 100 MG tablet Take 1 tablet (100 mg total) by mouth daily. 90 tablet 1    meclizine (ANTIVERT) 25 mg tablet Take 1 tablet (25 mg total) by mouth Three (3) times a day as needed for dizziness. (Patient not taking: Reported on 10/07/2021) 30 tablet 0    meloxicam (MOBIC) 15 MG tablet       methylPREDNISolone (MEDROL DOSEPACK) 4 mg tablet       metoprolol succinate (TOPROL-XL) 100 MG 24 hr tablet Take 1 tablet (100 mg total) by mouth two (2) times a day. Repeat for elevated BP. 180 tablet 3    omeprazole (PRILOSEC) 20 MG capsule Take 1 capsule (20 mg total) by mouth two (2) times a day. 180 capsule 1    ondansetron (ZOFRAN) 4 MG tablet TAKE ONE TABLET BY MOUTH EVERY 8 HOURS AS NEEDED FOR NAUSEA AND VOMITING FOR UP TO 5 DAYS ondansetron (ZOFRAN-ODT) 8 MG disintegrating tablet       peg-electrolyte soln (GOLYTELY) 420 gram SolR Take as directed in the instructions sent to you via your [facility] (Patient not taking: Reported on 03/29/2020) 2 Bottle 0    predniSONE (DELTASONE) 10 mg tablet pack prednisone 10 mg tablets in a dose pack   Take 1 dose pk by oral route.      pregabalin (LYRICA) 25 MG capsule Take 25 mg by mouth Two (2) times a  day.      promethazine-codeine (PHENERGAN WITH CODEINE) 6.25-10 mg/5 mL syrup 1-2 tsp po every 6 hours as needed for cough and congestion. 120 mL 0    traMADoL (ULTRAM) 50 mg tablet       traMADoL (ULTRAM) 50 mg tablet tramadol 50 mg tablet   Take 1 tablet every 6-8 hours by oral route.       No current facility-administered medications for this visit.       Allergies   Allergen Reactions    Iodine And Iodide Containing Products Anaphylaxis    Statins-Hmg-Coa Reductase Inhibitors      Side effects    Sulfa (Sulfonamide Antibiotics)     Oxycodone Nausea Only     Makes her feel weird       Patient Active Problem List   Diagnosis    Coronary artery disease involving native coronary artery of native heart without angina pectoris    Hypertension    Myocardial infarction (CMS-HCC)    Mild intermittent asthma without complication    Anxiety    Depression    Post traumatic stress disorder (PTSD)    Fibromyalgia    Panic disorder    Chest pain    Urge incontinence    Back spasm    Dyslipidemia    Epigastric pain    Nausea & vomiting    Elevated liver enzymes    Acute pancreatitis with infected necrosis    Tobacco abuse    Chronic diastolic CHF (congestive heart failure) (CMS-HCC)    Recurrent acute pancreatitis    Low back pain    Bilateral leg pain    Obstructive sleep apnea       Reviewed and up to date in Epic.    Appropriateness of Therapy     Acute infections noted within Epic:  No active infections  Patient reported infection: None    Is medication and dose appropriate based on diagnosis and infection status? Yes    Prescription has been clinically reviewed: Yes      Baseline Quality of Life Assessment      How many days over the past month did your hyperlipidemia  keep you from your normal activities? For example, brushing your teeth or getting up in the morning. 0    Financial Information     Medication Assistance provided: Prior Authorization    Anticipated copay of $0 (84 days) reviewed with patient. Verified delivery address.    Delivery Information     Scheduled delivery date: 05/03/22    Expected start date: 05/03/22    Medication will be delivered via Same Day Courier to the prescription address in [facility].  This shipment will not require a signature.      Explained the services we provide at [facility] and that each month we would call to set up refills.  Stressed importance of returning phone calls so that we could ensure they receive their medications in time each month.  Informed patient that we should be setting up refills 7-10 days prior to when they will run out of medication.  A pharmacist will reach out to perform a clinical assessment periodically.  Informed patient that a welcome packet, containing information about our pharmacy and other support services, a Notice of Privacy Practices, and a drug information handout will be sent.      The patient or caregiver noted above participated in the development of this care plan and knows that they can request review  of or adjustments to the care plan at any time.      Patient or caregiver verbalized understanding of the above information as well as how to contact the pharmacy at [phone] option 4 with any questions/concerns.  The pharmacy is open Monday through Friday 8:30am-4:30pm.  A pharmacist is available 24/7 via pager to answer any clinical questions they may have.    Patient Specific Needs     Does the patient have any physical, cognitive, or cultural barriers? No    Does the patient have adequate living arrangements? (i.e. the ability to store and take their medication appropriately) Yes    Did you identify any home environmental safety or security hazards? No    Patient prefers to have medications discussed with  Patient     Is the patient or caregiver able to read and understand education materials at a high school level or above? Yes    Patient's primary language is  English     Is the patient high risk? No    SOCIAL DETERMINANTS OF HEALTH     At the [facility], we have learned that life circumstances - like trouble affording food, housing, utilities, or transportation can affect the health of many of our patients.   That is why we wanted to ask: are you currently experiencing any life circumstances that are negatively impacting your health and/or quality of life? Patient declined to answer    Social Determinants of Health     Financial Resource Strain: Not on file   Internet Connectivity: Not on file   Food Insecurity: Not on file   Tobacco Use: Not on file   Housing/Utilities: Unknown    Within the past 12 months, have you ever stayed: outside, in a car, in a tent, in an overnight shelter, or temporarily in someone else's home (i.e. couch-surfing)?: No    Are you worried about losing your housing?: Not on file    Within the past 12 months, have you been unable to get utilities (heat, electricity) when it was really needed?: Not on file   Alcohol Use: Not on file   Transportation Needs: Not on file   Substance Use: Not on file   Health Literacy: Low Risk     : Never   Physical Activity: Not on file   Interpersonal Safety: Not on file   Stress: Not on file   Intimate Partner Violence: Not on file   Depression: Not on file   Social Connections: Not on file       Would you be willing to receive help with any of the needs that you have identified today? Not applicable       Camillo Flaming  [facility] Specialty Pharmacist

## 2022-05-02 ENCOUNTER — Ambulatory Visit
Admission: RE | Admit: 2022-05-02 | Discharge: 2022-05-02 | Disposition: A | Discharge status: Home / Self Care | Payer: 59 | Source: Ambulatory Visit | Attending: Gerontology | Admitting: Gerontology

## 2022-05-02 DIAGNOSIS — N83202 Unspecified ovarian cyst, left side: Secondary | ICD-10-CM | POA: Insufficient documentation

## 2022-05-02 DIAGNOSIS — N858 Other specified noninflammatory disorders of uterus: Secondary | ICD-10-CM | POA: Diagnosis present

## 2022-05-02 DIAGNOSIS — N939 Abnormal uterine and vaginal bleeding, unspecified: Secondary | ICD-10-CM | POA: Diagnosis present

## 2022-05-03 MED FILL — REPATHA SYRINGE 140 MG/ML SUBCUTANEOUS SYRINGE: 84 days supply | Qty: 6 | Fill #0

## 2022-05-07 MED FILL — REPATHA SYRINGE 140 MG/ML SUBCUTANEOUS SYRINGE: 84 days supply | Qty: 6 | Fill #1

## 2022-05-16 DIAGNOSIS — G4733 Obstructive sleep apnea (adult) (pediatric): Principal | ICD-10-CM

## 2022-05-23 ENCOUNTER — Emergency Department
Admission: EM | Admit: 2022-05-23 | Discharge: 2022-05-23 | Discharge status: Against Medical Advice | Payer: 59 | Attending: Emergency Medicine | Admitting: Emergency Medicine

## 2022-05-23 ENCOUNTER — Other Ambulatory Visit: Payer: Self-pay

## 2022-05-23 ENCOUNTER — Emergency Department: Payer: 59

## 2022-05-23 DIAGNOSIS — R079 Chest pain, unspecified: Secondary | ICD-10-CM | POA: Diagnosis not present

## 2022-05-23 DIAGNOSIS — Z5321 Procedure and treatment not carried out due to patient leaving prior to being seen by health care provider: Secondary | ICD-10-CM | POA: Insufficient documentation

## 2022-05-23 DIAGNOSIS — R42 Dizziness and giddiness: Secondary | ICD-10-CM | POA: Diagnosis not present

## 2022-05-23 LAB — BASIC METABOLIC PANEL
Anion gap: 7 (ref 5–15)
BUN: 20 mg/dL (ref 8–23)
CO2: 25 mmol/L (ref 22–32)
Calcium: 8.4 mg/dL — ABNORMAL LOW (ref 8.9–10.3)
Chloride: 109 mmol/L (ref 98–111)
Creatinine, Ser: 0.84 mg/dL (ref 0.44–1.00)
GFR, Estimated: >60 mL/min (ref >60)
Glucose, Bld: 179 mg/dL — ABNORMAL HIGH (ref 70–99)
Potassium: 3.7 mmol/L (ref 3.5–5.1)
Sodium: 141 mmol/L (ref 135–145)

## 2022-05-23 LAB — CBC
HCT: 39.4 % (ref 36.0–46.0)
Hemoglobin: 12 g/dL (ref 12.0–15.0)
MCH: 25.5 pg — ABNORMAL LOW (ref 26.0–34.0)
MCHC: 30.5 g/dL (ref 30.0–36.0)
MCV: 83.7 fL (ref 80.0–100.0)
Platelets: 325 10*3/uL (ref 150–400)
RBC: 4.71 MIL/uL (ref 3.87–5.11)
RDW: 16 % — ABNORMAL HIGH (ref 11.5–15.5)
WBC: 8.9 10*3/uL (ref 4.0–10.5)
nRBC: 0 % (ref 0.0–0.2)

## 2022-05-23 LAB — TROPONIN I (HIGH SENSITIVITY): Troponin I (High Sensitivity): 5 ng/L (ref <18)

## 2022-05-23 NOTE — ED Triage Notes (Signed)
PT arrives today EMS from home with CP starting 1430. PT called ems and was given 324 ASA. Pt arrives alert and oriented. NAD. Pt stating dizzy in shower this morning and then a sudden chest pain.

## 2022-05-23 NOTE — ED Notes (Signed)
First Nurse Note: Pt to ED via [facility] from home for chest pain that started 30 minutes ago when she was in the shower. Pt has been feeling lightheaded. Pt states that the pain is a tearing pain and that pain radiated into her back. Pt took 2 nitro prior to EMS arrival as well as 324 of aspirin. Pt given an additional spray of ntg by ems.   Initial BP 97/72     Last BP- 131/73 18 G IV in the left hand  500 cc of fluid given

## 2022-05-23 NOTE — ED Triage Notes (Signed)
PMH 3 MI per patient.

## 2022-05-24 ENCOUNTER — Other Ambulatory Visit: Payer: Self-pay | Admitting: Specialist

## 2022-05-24 DIAGNOSIS — R7981 Abnormal blood-gas level: Secondary | ICD-10-CM

## 2022-05-24 DIAGNOSIS — R0602 Shortness of breath: Secondary | ICD-10-CM

## 2022-05-31 ENCOUNTER — Other Ambulatory Visit: Payer: Self-pay | Admitting: Orthopedic Surgery

## 2022-05-31 DIAGNOSIS — M2391 Unspecified internal derangement of right knee: Secondary | ICD-10-CM

## 2022-05-31 DIAGNOSIS — M25561 Pain in right knee: Secondary | ICD-10-CM

## 2022-05-31 DIAGNOSIS — S8391XD Sprain of unspecified site of right knee, subsequent encounter: Secondary | ICD-10-CM

## 2022-06-05 ENCOUNTER — Other Ambulatory Visit: Payer: 59

## 2022-06-14 ENCOUNTER — Ambulatory Visit
Admission: RE | Admit: 2022-06-14 | Discharge: 2022-06-14 | Disposition: A | Discharge status: Home / Self Care | Payer: 59 | Source: Ambulatory Visit | Attending: Specialist | Admitting: Specialist

## 2022-06-14 ENCOUNTER — Ambulatory Visit
Admission: RE | Admit: 2022-06-14 | Discharge: 2022-06-14 | Disposition: A | Discharge status: Home / Self Care | Payer: 59 | Source: Ambulatory Visit | Attending: Orthopedic Surgery | Admitting: Orthopedic Surgery

## 2022-06-14 DIAGNOSIS — R0602 Shortness of breath: Secondary | ICD-10-CM

## 2022-06-14 DIAGNOSIS — M25561 Pain in right knee: Secondary | ICD-10-CM

## 2022-06-14 DIAGNOSIS — S8391XD Sprain of unspecified site of right knee, subsequent encounter: Secondary | ICD-10-CM

## 2022-06-14 DIAGNOSIS — M2391 Unspecified internal derangement of right knee: Secondary | ICD-10-CM

## 2022-06-14 DIAGNOSIS — R7981 Abnormal blood-gas level: Secondary | ICD-10-CM

## 2022-07-15 ENCOUNTER — Ambulatory Visit: Admit: 2022-07-15 | Discharge: 2022-07-15 | Disposition: A | Discharge status: Home / Self Care | Payer: MEDICARE

## 2022-07-15 ENCOUNTER — Emergency Department: Admit: 2022-07-15 | Discharge: 2022-07-15 | Disposition: A | Discharge status: Home / Self Care | Payer: MEDICARE

## 2022-07-15 DIAGNOSIS — Z872 Personal history of diseases of the skin and subcutaneous tissue: Principal | ICD-10-CM

## 2022-07-15 DIAGNOSIS — J069 Acute upper respiratory infection, unspecified: Principal | ICD-10-CM

## 2022-07-15 LAB — COMPREHENSIVE METABOLIC PANEL
ALBUMIN: 3.5 g/dL (ref 3.4–5.0)
ALKALINE PHOSPHATASE: 115 U/L (ref 46–116)
ALT (SGPT): 21 U/L (ref 10–49)
ANION GAP: 4 mmol/L — ABNORMAL LOW (ref 5–14)
AST (SGOT): 15 U/L (ref <=34)
BILIRUBIN TOTAL: 0.3 mg/dL (ref 0.3–1.2)
BLOOD UREA NITROGEN: 9 mg/dL (ref 9–23)
BUN / CREAT RATIO: 13
CALCIUM: 9.4 mg/dL (ref 8.7–10.4)
CHLORIDE: 104 mmol/L (ref 98–107)
CO2: 31.9 mmol/L — ABNORMAL HIGH (ref 20.0–31.0)
CREATININE: 0.67 mg/dL
EGFR CKD-EPI (2021) FEMALE: >90 mL/min/{1.73_m2} (ref >=60)
GLUCOSE RANDOM: 108 mg/dL (ref 70–179)
POTASSIUM: 4.5 mmol/L (ref 3.4–4.8)
PROTEIN TOTAL: 7.5 g/dL (ref 5.7–8.2)
SODIUM: 140 mmol/L (ref 135–145)

## 2022-07-15 LAB — HIGH SENSITIVITY TROPONIN I - SINGLE: HIGH SENSITIVITY TROPONIN I: 3 ng/L (ref <=34)

## 2022-07-15 LAB — CBC W/ AUTO DIFF
BASOPHILS ABSOLUTE COUNT: 0 10*9/L (ref 0.0–0.1)
BASOPHILS RELATIVE PERCENT: 0.4 %
EOSINOPHILS ABSOLUTE COUNT: 0.2 10*9/L (ref 0.0–0.5)
EOSINOPHILS RELATIVE PERCENT: 2.2 %
HEMATOCRIT: 36.6 % (ref 34.0–44.0)
HEMOGLOBIN: 11.7 g/dL (ref 11.3–14.9)
LYMPHOCYTES ABSOLUTE COUNT: 2.2 10*9/L (ref 1.1–3.6)
LYMPHOCYTES RELATIVE PERCENT: 21 %
MEAN CORPUSCULAR HEMOGLOBIN CONC: 32 g/dL (ref 32.0–36.0)
MEAN CORPUSCULAR HEMOGLOBIN: 25.8 pg — ABNORMAL LOW (ref 25.9–32.4)
MEAN CORPUSCULAR VOLUME: 80.6 fL (ref 77.6–95.7)
MEAN PLATELET VOLUME: 8 fL (ref 6.8–10.7)
MONOCYTES ABSOLUTE COUNT: 0.5 10*9/L (ref 0.3–0.8)
MONOCYTES RELATIVE PERCENT: 5.3 %
NEUTROPHILS ABSOLUTE COUNT: 7.3 10*9/L (ref 1.8–7.8)
NEUTROPHILS RELATIVE PERCENT: 71.1 %
NUCLEATED RED BLOOD CELLS: 0 /100{WBCs} (ref <=4)
PLATELET COUNT: 284 10*9/L (ref 150–450)
RED BLOOD CELL COUNT: 4.54 10*12/L (ref 3.95–5.13)
RED CELL DISTRIBUTION WIDTH: 17.2 % — ABNORMAL HIGH (ref 12.2–15.2)
WBC ADJUSTED: 10.3 10*9/L (ref 3.6–11.2)

## 2022-07-15 LAB — TSH: THYROID STIMULATING HORMONE: 3.782 u[IU]/mL (ref 0.550–4.780)

## 2022-07-15 MED ADMIN — sodium chloride 0.9% (NS) bolus 1,000 mL: 1000 mL | INTRAVENOUS | @ 20:00:00 | Stop: 2022-07-15

## 2022-07-15 NOTE — Unmapped (Signed)
[facility]  Emergency Department Provider Note          ED Clinical Impression      Final diagnoses:   Hx of onychia and paronychia (Primary)   Viral URI            Impression, Medical Decision Making, Progress Notes and Critical Care      Impression, Differential Diagnosis and Plan of Care    Patient is a 66 y.o. female with PMH of COPD, asthma, and CHF on 3L oxygen at baseline,  MI, heart murmur, BPPV, panic disorder, HTN, HLD, GERD, and fibromyalgia who reports to the ED per the recommendation of her PCP from her steroid injection appointment for tachycardia in the setting of 5 days of general malaise, worsened shortness of breath, and cough since Thursday.     On exam, the patient appears in no acute distress, resting comfortably. VS are notable for HR of 104 BPM but are otherwise normotensive, afebrile, and saturating at 96% on oxygen therapy. Physical exam is notable for bibasilar crackles. 2/6 systolic murmur. No pedal edema. Medial half of right great toe has been surgically removed with no signs of infection, purulent drainage, or erythema. Otherwise normal.     Differential includes viral URI, pneumonia, COPD, CHF exacerbation.     Plan for CXR, EKG, TSH, CBC, and CMB. Will give IV fluids and reasses.    Labs Reviewed   COMPREHENSIVE METABOLIC PANEL - Abnormal; Notable for the following components:       Result Value    CO2 31.9 (*)     Anion Gap 4 (*)     All other components within normal limits   CBC W/ AUTO DIFF - Abnormal; Notable for the following components:    MCH 25.8 (*)     RDW 17.2 (*)     Anisocytosis Slight (*)     All other components within normal limits   INFLUENZA/RSV/COVID PCR - Normal    Narrative:     This test was performed using the Cepheid Xpert Xpress SARS-CoV-2/Flu/RSV plus assay, which has been validated by the CLIA-certified, CAP-inspected [facility]. FDA has granted Emergency Use Authorization for this test. Negative results do not preclude infection and should be interpreted along with clinical observations, patient history, and epidemiological information. Information for providers and patients can be found here: [email or web address]   TSH - Normal   HIGH SENSITIVITY TROPONIN I - SINGLE - Normal   CBC W/ DIFFERENTIAL    Narrative:     The following orders were created for panel order CBC w/ Differential.  Procedure                               Abnormality         Status                     ---------                               -----------         ------                     CBC w/ Differential[[id number]]         Abnormal            Final result  Please view results for these tests on the individual orders.     XR Chest Portable   Final Result   No focal pulmonary opacities.             Overall feel the patient is stable for discharge at this time.    Stay in the emergency department patient with SPO2 greater than 92% on baseline home oxygen.  Patient was updated to her findings in the emergency department, advised to make an appoint with her primary care physician as well as her podiatrist for continued management.  Patient was advised to return to the emergency department with any worsening or worrisome symptoms.  She was discharged in stable condition.    Independent Interpretation of Studies    I have independently interpreted the following studies:  EKG: 12:51 12 Lead EKG -normal sinus rhythm.  No ST elevation/depression.  Reveals within normal limits. EKG interpreted by me  X-ray(s): CXR No acute intrathoracic process.     Considerations Regarding Disposition/Escalation of Care and Critical Care    Patient/Family/Caregiver Discussions:N/A  Prescription Drugs Provided or Considered But Not Given: Consideration given for antibiotic prescription though at this time do believe the patient symptoms are likely related to a viral URI.    Portions of this record have been created using Scientist, clinical (histocompatibility and immunogenetics). Dictation errors have been sought, but may not have been identified and corrected.    See chart and resident provider documentation for details.    ____________________________________________         History        Reason for Visit  Medical Problem      HPI   Marie Quinn is a 66 y.o. female with a history of COPD, asthma, and CHF on 3L oxygen at baseline,  MI, heart murmur, BPPV, panic disorder, HTN, HLD, GERD, and fibromyalgia who presents to the ED tachycardia. The patient reports per the recommendation of her PCP from her steroid injection appointment for tachycardia in the setting of 5 days of general malaise, fatigue, worsened shortness of breath, and cough since Thursday. Per chart, her heart rate was measured in the 130s and her blood pressure was elevated to 146/106 at her PCP. On exam here, the patient also endorses that she had a fever of 101 F on Thursday but not since. She reports that she was evaluated by a nurse at her home on Friday (4 days ago), who found that her lower lungs had a crackle in it.  On interview, she states that her cough has somewhat improved today. She reports that she has been sleeping all day and night since symptom onset. Of note, she also reports a recent infection on her right toe.  Denies history of blood clots. Denies recent sick contacts.        Outside Historian(s)  (EMS, Significant Other, Family, Parent, Caregiver, Friend, Patent examiner, etc.)    N/A    External Records Reviewed  (Inpatient/Outpatient notes, Prior labs/imaging studies, Care Everywhere, PDMP, External ED notes, etc)    07/15/2022 [facility] (details patient's tachycardia and hypotension at PCP appointment today)     Past Medical History:   Diagnosis Date    Achilles tendinitis, right leg 2021    Anxiety     Arthritis     Adult life    Asthma     BPPV (benign paroxysmal positional vertigo)     Chronic diastolic CHF (congestive heart failure) (CMS-HCC)     COPD (chronic obstructive  pulmonary disease) (CMS-HCC)     Depression     Fibromyalgia     GERD (gastroesophageal reflux disease)     Adult life    Heart disease     Heart murmur     Hyperlipidemia     Hypertension     Myocardial infarction (CMS-HCC) 2008    s/p stent placement x 1 at [facility]    Obesity     Panic disorder     Substance abuse (CMS-HCC)     Urge incontinence        Patient Active Problem List   Diagnosis    Coronary artery disease involving native coronary artery of native heart without angina pectoris    Hypertension    Myocardial infarction (CMS-HCC)    Mild intermittent asthma without complication    Anxiety    Depression    Post traumatic stress disorder (PTSD)    Fibromyalgia    Panic disorder    Chest pain    Urge incontinence    Back spasm    Dyslipidemia    Epigastric pain    Nausea & vomiting    Elevated liver enzymes    Acute pancreatitis with infected necrosis    Tobacco abuse    Chronic diastolic CHF (congestive heart failure) (CMS-HCC)    Recurrent acute pancreatitis    Low back pain    Bilateral leg pain    Obstructive sleep apnea       Past Surgical History:   Procedure Laterality Date    cardiac stent x 1      CHOLECYSTECTOMY      HYSTERECTOMY  1990    one ovary remains    PR COLORECTAL SCRN; HI RISK IND N/A 08/06/2019    Procedure: COLOREC CANCR SCR; COLNSCPY;  Surgeon: Charm Rings, MD;  [facility];  Service: Gastroenterology    PR ERCP REMOVE FOREIGN BODY/STENT BILIARY/PANC DUCT N/A 08/09/2019    Procedure: ENDOSCOPIC RETROGRADE CHOLANGIOPANCREATOGRAPHY (ERCP); W/ REMOVAL OF FOREIGN BODY/STENT FROM BILIARY/PANCREATIC DUCT(S);  Surgeon: Vonda Antigua, MD;  [facility]: [facility];  Service: Gastroenterology    PR ERCP STENT PLACEMENT BILIARY/PANCREATIC DUCT N/A 06/28/2019    Procedure: ENDOSCOPIC RETROGRADE CHOLANGIOPANCREATOGRAPHY (ERCP); WITH PLACEMENT OF ENDOSCOPIC STENT INTO BILIARY OR PANCREATIC DUCT;  Surgeon: Vonda Antigua, MD;  Location: [facility];  Service: Gastroenterology    PR ERCP,SPHINCTEROTOMY N/A 11/12/2018    Procedure: ERCP; W/SPHINCTEROTOMY/PAPILLOTOMY;  Surgeon: Vonda Antigua, MD;  Location: [facility];  Service: Gastroenterology    PR ERCP,W/REMOVAL STONE,BIL/PANCR DUCTS N/A 11/12/2018    Procedure: ERCP; W/ENDOSCOPIC RETROGRADE REMOVAL OF CALCULUS/CALCULI FROM BILIARY &/OR PANCREATIC DUCTS;  Surgeon: Vonda Antigua, MD;  Location: [facility];  Service: Gastroenterology    TONSILLECTOMY      TUBAL LIGATION  1990    Tubal pregnacy         Current Facility-Administered Medications:     sodium chloride 0.9% (NS) bolus 1,000 mL, 1,000 mL, Intravenous, Once, Lenord Fellers, DO    Current Outpatient Medications:     albuterol 2.5 mg /3 mL (0.083 %) nebulizer solution, Inhale the contents of 1 vial (2.5 mg total) by nebulization every four (4) hours as needed for wheezing., Disp: 150 mL, Rfl: 1    albuterol HFA 90 mcg/actuation inhaler, Inhale 2 puffs every four (4) hours as needed for wheezing., Disp: 18 g, Rfl: 1    amLODIPine (NORVASC) 10 MG tablet, Take 1 tablet (  10 mg total) by mouth daily., Disp: 90 tablet, Rfl: 2    aspirin (ECOTRIN) 81 MG tablet, Take 81 mg by mouth daily., Disp: , Rfl:     buPROPion (WELLBUTRIN SR) 150 MG 12 hr tablet, Take 1 tablet (150 mg total) by mouth Two (2) times a day. (Patient not taking: Reported on 10/07/2021), Disp: 60 tablet, Rfl: 3    citalopram (CELEXA) 40 MG tablet, TAKE (1) TABLET BY MOUTH EVERY DAY (Patient not taking: Reported on 10/07/2021), Disp: 90 tablet, Rfl: 3    empty container (SHARPS CONTAINER) Misc, Use as directed., Disp: 1 each, Rfl: 3    empty container Misc, Use as directed to dispose of injectable medications, Disp: 1 each, Rfl: 3    evolocumab (REPATHA SYRINGE) 140 mg/mL Syrg, Inject the contents of 1 syringe (140 mg) under the skin every fourteen (14) days. (Patient not taking: Reported on 10/07/2021), Disp: 6 mL, Rfl: 3    evolocumab (REPATHA SYRINGE) 140 mg/mL Syrg, Inject the contents of 1 syringe (140 mg) under the skin every fourteen (14) days., Disp: 6 mL, Rfl: 3    gabapentin (NEURONTIN) 100 MG capsule, , Disp: , Rfl:     glimepiride (AMARYL) 4 MG tablet, Take 4 mg by mouth daily before breakfast., Disp: , Rfl:     hydrOXYzine (ATARAX) 25 MG tablet, Take 1 Tablet by mouth every 6 hours as needed for anxiety and congestion, Disp: 30 tablet, Rfl: 0    losartan (COZAAR) 100 MG tablet, Take 1 tablet (100 mg total) by mouth daily., Disp: 90 tablet, Rfl: 1    meclizine (ANTIVERT) 25 mg tablet, Take 1 tablet (25 mg total) by mouth Three (3) times a day as needed for dizziness. (Patient not taking: Reported on 10/07/2021), Disp: 30 tablet, Rfl: 0    meloxicam (MOBIC) 15 MG tablet, , Disp: , Rfl:     methylPREDNISolone (MEDROL DOSEPACK) 4 mg tablet, , Disp: , Rfl:     metoprolol succinate (TOPROL-XL) 100 MG 24 hr tablet, Take 1 tablet (100 mg total) by mouth two (2) times a day. Repeat for elevated BP., Disp: 180 tablet, Rfl: 3    omeprazole (PRILOSEC) 20 MG capsule, Take 1 capsule (20 mg total) by mouth two (2) times a day., Disp: 180 capsule, Rfl: 1    ondansetron (ZOFRAN) 4 MG tablet, TAKE ONE TABLET BY MOUTH EVERY 8 HOURS AS NEEDED FOR NAUSEA AND VOMITING FOR UP TO 5 DAYS, Disp: , Rfl:     ondansetron (ZOFRAN-ODT) 8 MG disintegrating tablet, , Disp: , Rfl:     peg-electrolyte soln (GOLYTELY) 420 gram SolR, Take as directed in the instructions sent to you via your [facility] (Patient not taking: Reported on 03/29/2020), Disp: 2 Bottle, Rfl: 0    predniSONE (DELTASONE) 10 mg tablet pack, prednisone 10 mg tablets in a dose pack  Take 1 dose pk by oral route., Disp: , Rfl:     pregabalin (LYRICA) 25 MG capsule, Take 25 mg by mouth Two (2) times a day., Disp: , Rfl:     promethazine-codeine (PHENERGAN WITH CODEINE) 6.25-10 mg/5 mL syrup, 1-2 tsp po every 6 hours as needed for cough and congestion., Disp: 120 mL, Rfl: 0    traMADoL (ULTRAM) 50 mg tablet, , Disp: , Rfl:     traMADoL (ULTRAM) 50 mg tablet, tramadol 50 mg tablet  Take 1 tablet every 6-8 hours by oral route., Disp: , Rfl:     Allergies  Iodine and iodide containing products, Statins-hmg-coa reductase  inhibitors, Sulfa (sulfonamide antibiotics), and Oxycodone    Family History   Problem Relation Age of Onset    Heart disease Father         Deacesed    Coronary artery disease Father         quadruple bypass    Mental illness Father     Asthma Father     COPD Father     Depression Father     Hypertension Father     Cancer Sister     Arthritis Daughter     Depression Daughter     Asthma Sister     Cancer Sister         Colan cancer deceased    COPD Sister     Depression Sister     Early death Sister         28 yrs old    Cancer Paternal Aunt         Thyroid cancer    Cancer Maternal Grandmother         Brain cancer    Depression Mother     Diabetes Mother     Hypertension Mother     Diabetes Brother     Vision loss Maternal Aunt         Blind at birth    Substance Abuse Disorder Neg Hx     Alcohol abuse Neg Hx     Drug abuse Neg Hx        Social History  Social History     Tobacco Use    Smoking status: Every Day     Years: 25     Types: Cigarettes     Last attempt to quit: 05/02/2019     Years since quitting: 3.2    Smokeless tobacco: Never    Tobacco comments:     Pt smokes  1.5cpd. Pt expressed desire to quit    Vaping Use    Vaping Use: Never used   Substance Use Topics    Alcohol use: Yes    Drug use: Not Currently     Comment: not used cocaine in 13 years       Review of Systems    Constitutional: Negative for fever.  Eyes: Negative for visual changes.  ENT: Negative for sore throat.  Cardiovascular: Negative for chest pain.  Respiratory: Negative for shortness of breath.  Gastrointestinal: Negative for abdominal pain, vomiting or diarrhea.  Genitourinary: Negative for dysuria.   Musculoskeletal: Negative for back pain.  Skin: Negative for rash.  Neurological: Negative for headaches, focal weakness or numbness.  All other systems reviewed and otherwise negative      Physical Exam       ED Triage Vitals [07/15/22 1207]   Enc Vitals Group      BP 129/58      Heart Rate 104      SpO2 Pulse       Resp 16      Temp 37 ??C (98.6 ??F)      Temp Source Oral      SpO2 97 %     Constitutional: Alert and oriented. Well appearing and in no distress.  Eyes: Conjunctivae are normal.  ENT       Head: Normocephalic and atraumatic.       Nose: No congestion.       Mouth/Throat: Mucous membranes are moist.       Neck: No stridor.  Hematological/Lymphatic/Immunilogical: No cervical lymphadenopathy.  Cardiovascular: 2/6 systolic murmur.  No pedal edema. Normal and symmetric distal pulses are present in all extremities.   Respiratory: Bibasilar crackles. Normal respiratory effort.  On baseline mental oxygen.  Gastrointestinal: Soft and nontender. There is no CVA tenderness.  Musculoskeletal: Medial half of right great toe has been surgically removed with no signs of infection, purulent drainage, or erythema.       Right lower leg: No tenderness or edema.       Left lower leg: No tenderness or edema.  Neurologic: Normal speech and language. No gross focal neurologic deficits are appreciated.  Skin: Skin is warm, dry and intact. No rash noted.  Psychiatric: Mood and affect are normal. Speech and behavior are normal.     Radiology     XR Chest Portable   Final Result   No focal pulmonary opacities.             Documentation assistance was provided by Lance Morin, Scribe, on July 15, 2022 at 3:33 PM for Isaias Sakai, DO.     July 15, 2022 5:57 PM. Documentation assistance provided by the scribe. I was present during the time the encounter was recorded. The information recorded by the scribe was done at my direction and has been reviewed and validated by me.           Lenord Fellers, DO  07/15/22 1757

## 2022-07-15 NOTE — Unmapped (Signed)
Pt sts she was seen by PCP this morning and BP and HR was elevated, pt is unsure of the value. Advised to come to ED for further evaluation.

## 2022-07-17 IMAGING — MR MR LUMBAR SPINE W/O CM
5 series · 32 of 48 positions shown · non-contrast
Comparison: Lumbar spine MRI 09/21/2020

CLINICAL DATA: Chronic low back pain.

EXAM:
MRI LUMBAR SPINE WITHOUT CONTRAST
TECHNIQUE: Multiplanar, multisequence MR imaging of the lumbar spine was
performed. No intravenous contrast was administered.

[Series 5: T2 · sagittal · 4.0mm · 0.81mm/px · 6 of 17 slices shown (1 of 2)]
[im 1/17]
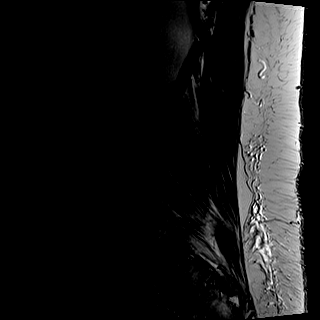
[im 4/17]
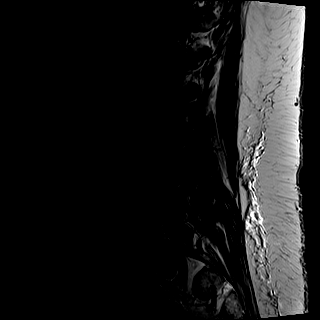
[im 7/17]
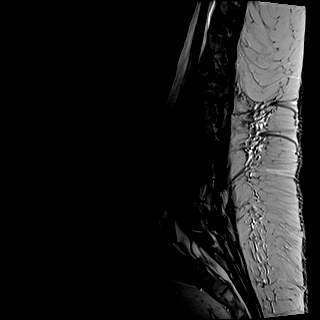
[im 10/17]
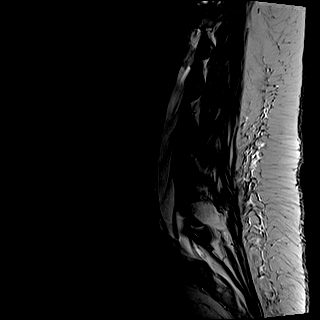
[im 13/17]
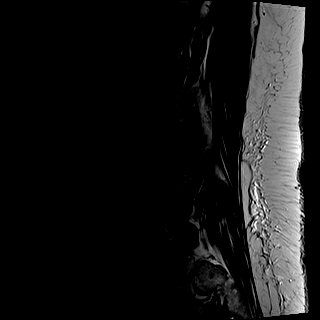
[im 17/17]
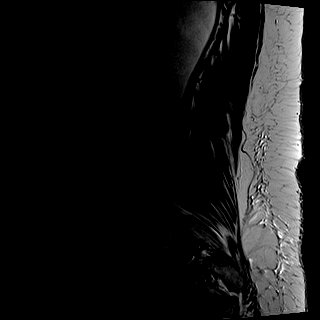

[Series 6: T1 · sagittal · 4.0mm · 0.81mm/px · 6 of 17 slices shown (1 of 2)]
[im 1/17]
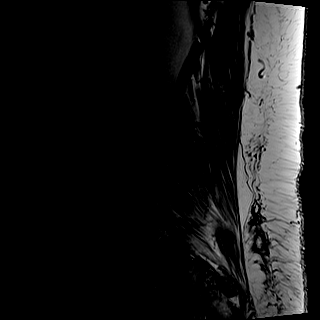
[im 4/17]
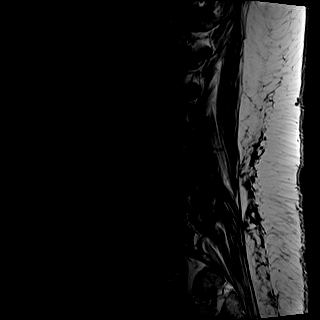
[im 7/17]
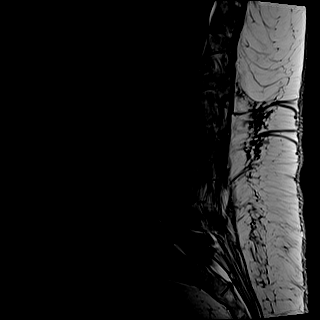
[im 10/17]
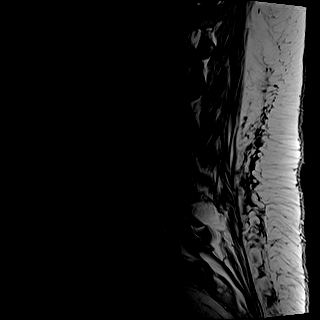
[im 13/17]
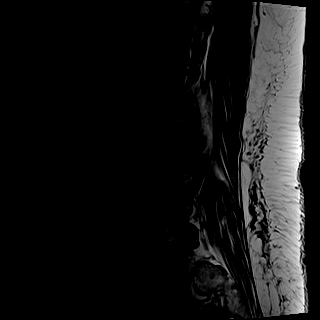
[im 17/17]
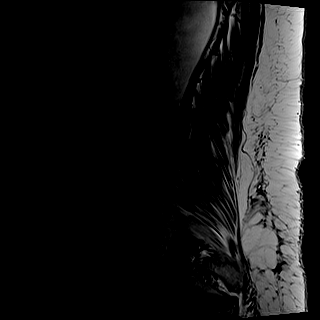

[Series 7: STIR · sagittal · 4.0mm · 0.41mm/px · 2 of 17 slices shown]
[im 1/17]
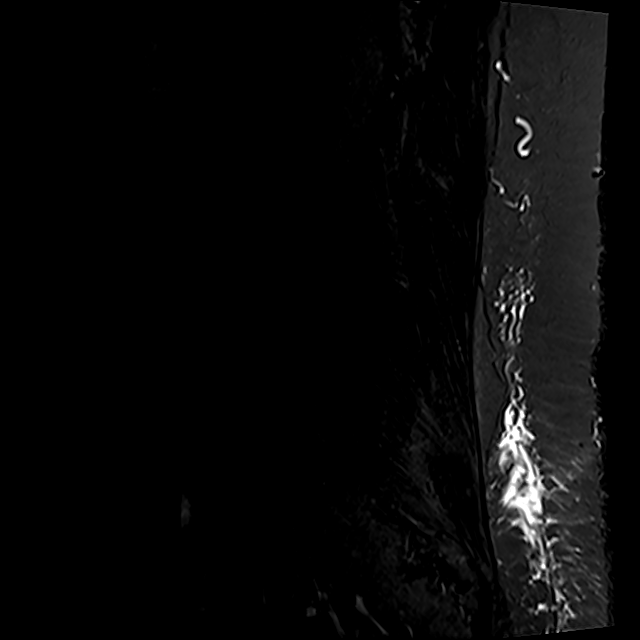
[im 4/17]
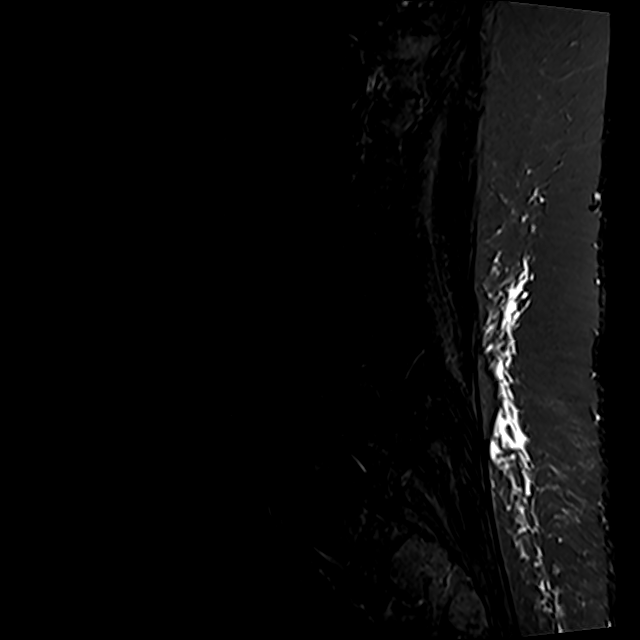

[Series 8: T2 · axial · 4.0mm · 0.78mm/px · z∈[-29,+181]mm · 9 of 39 slices shown (2 of 2)]
[im 1/39]
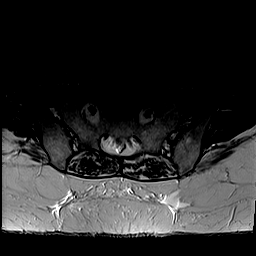
[im 6/39]
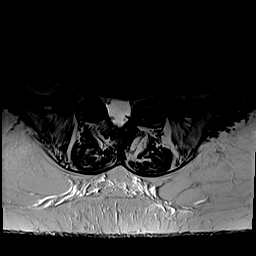
[im 11/39]
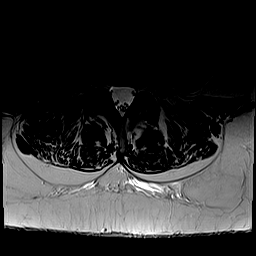
[im 17/39]
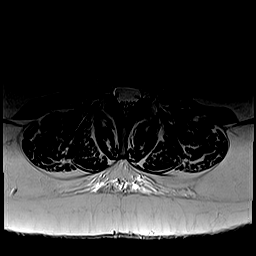
[im 20/39]
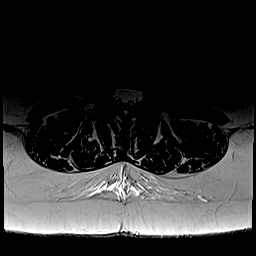
[im 22/39]
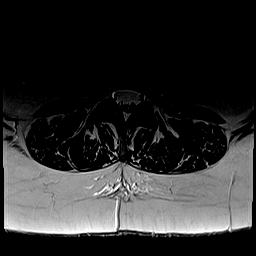
[im 28/39]
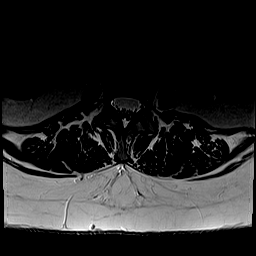
[im 33/39]
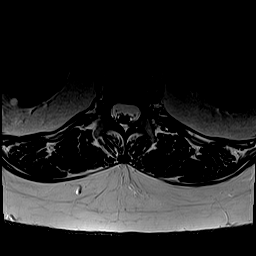
[im 39/39]
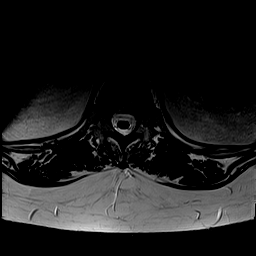

[Series 9: T1 · axial · 4.0mm · 0.39mm/px · z∈[-29,+181]mm · 9 of 39 slices shown (2 of 2)]
[im 1/39]
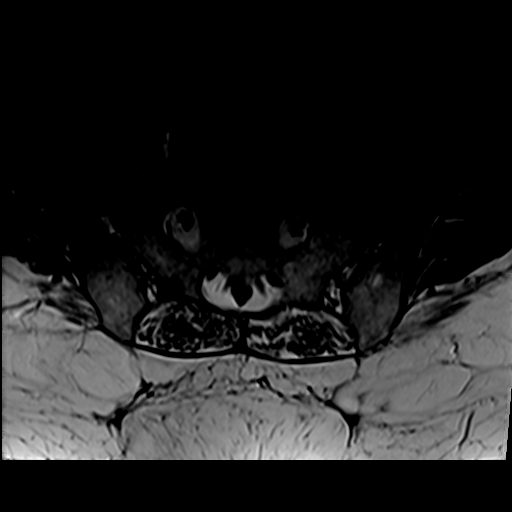
[im 6/39]
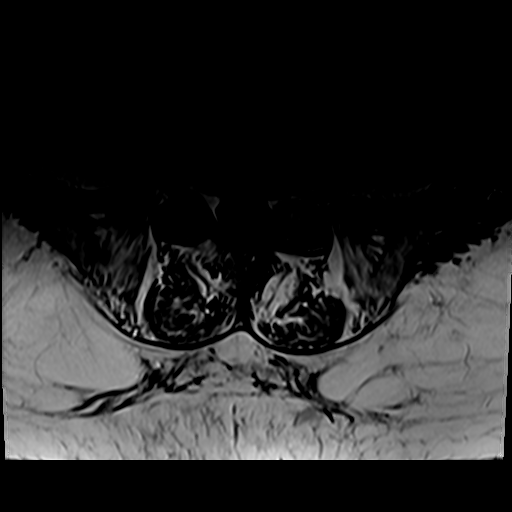
[im 11/39]
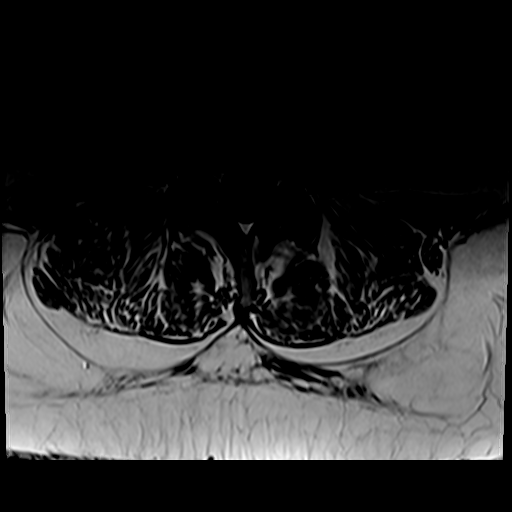
[im 17/39]
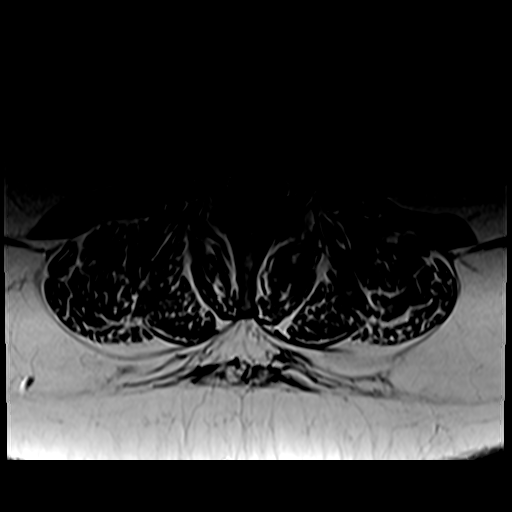
[im 20/39]
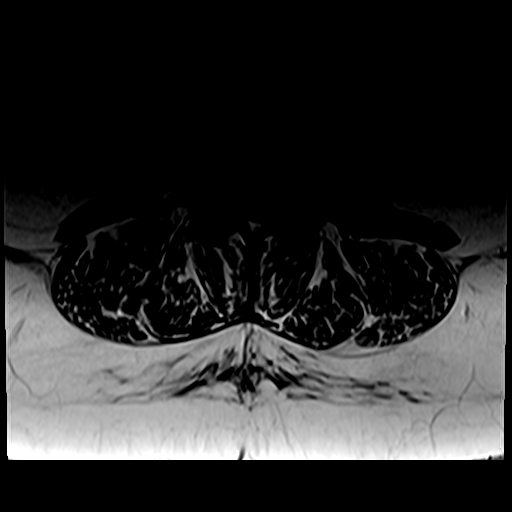
[im 22/39]
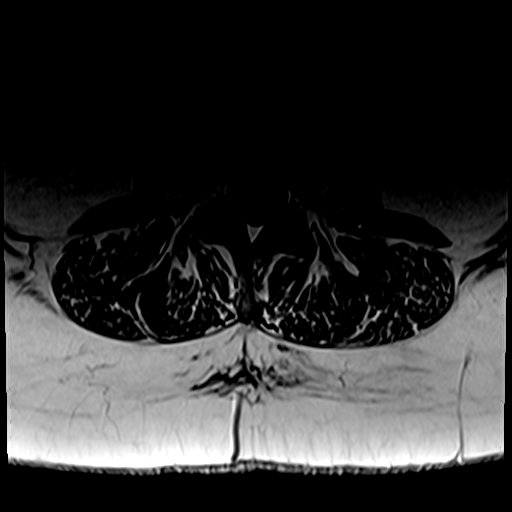
[im 28/39]
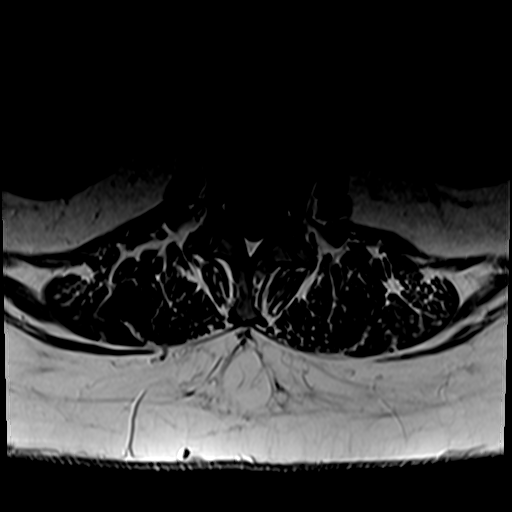
[im 33/39]
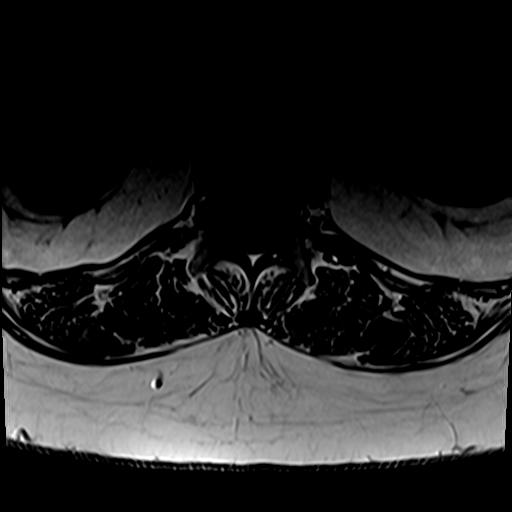
[im 39/39]
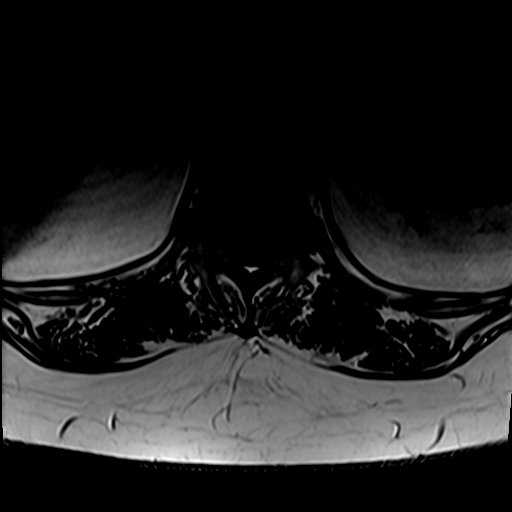

[32 of 48 positions shown; findings below may reference images not displayed]

FINDINGS: Segmentation:  Standard.

Alignment:  Normal.

Vertebrae: No fracture, suspicious marrow lesion, or significant
marrow edema.

Conus medullaris and cauda equina: Conus extends to the L1-2 level.
Conus and cauda equina appear normal.

Paraspinal and other soft tissues: Unremarkable.

Disc levels:

Disc desiccation greatest at L4-5 and L5-S1. Preserved disc space
heights.

T12-L1 through L2-3: Negative.

L3-4: Mild facet hypertrophy without stenosis.

L4-5: Mild facet hypertrophy without stenosis.

L5-S1: Mild disc bulging and mild left greater than right facet
hypertrophy without stenosis, unchanged.
IMPRESSION: Mild lumbar spondylosis and facet hypertrophy without stenosis.

## 2022-07-23 NOTE — Unmapped (Signed)
[facility] Clinical Assessment & Refill Coordination Note    Marie Quinn, [place]: 1956/04/08  Phone: [phone] (home)     All above HIPAA information was verified with patient.     Was a Nurse, learning disability used for this call? No    Specialty Medication(s):   General Specialty: Repatha     Current Outpatient Medications   Medication Sig Dispense Refill    albuterol 2.5 mg /3 mL (0.083 %) nebulizer solution Inhale the contents of 1 vial (2.5 mg total) by nebulization every four (4) hours as needed for wheezing. 150 mL 1    albuterol HFA 90 mcg/actuation inhaler Inhale 2 puffs every four (4) hours as needed for wheezing. 18 g 1    amLODIPine (NORVASC) 10 MG tablet Take 1 tablet (10 mg total) by mouth daily. 90 tablet 2    aspirin (ECOTRIN) 81 MG tablet Take 81 mg by mouth daily.      buPROPion (WELLBUTRIN SR) 150 MG 12 hr tablet Take 1 tablet (150 mg total) by mouth Two (2) times a day. (Patient not taking: Reported on 10/07/2021) 60 tablet 3    citalopram (CELEXA) 40 MG tablet TAKE (1) TABLET BY MOUTH EVERY DAY (Patient not taking: Reported on 10/07/2021) 90 tablet 3    empty container (SHARPS CONTAINER) Misc Use as directed. 1 each 3    empty container Misc Use as directed to dispose of injectable medications 1 each 3    evolocumab (REPATHA SYRINGE) 140 mg/mL Syrg Inject the contents of 1 syringe (140 mg) under the skin every fourteen (14) days. (Patient not taking: Reported on 10/07/2021) 6 mL 3    evolocumab (REPATHA SYRINGE) 140 mg/mL Syrg Inject the contents of 1 syringe (140 mg) under the skin every fourteen (14) days. 6 mL 3    gabapentin (NEURONTIN) 100 MG capsule  (Patient not taking: Reported on 02/08/2020)      glimepiride (AMARYL) 4 MG tablet Take 4 mg by mouth daily before breakfast.      hydrOXYzine (ATARAX) 25 MG tablet Take 1 Tablet by mouth every 6 hours as needed for anxiety and congestion 30 tablet 0    losartan (COZAAR) 100 MG tablet Take 1 tablet (100 mg total) by mouth daily. 90 tablet 1    meclizine (ANTIVERT) 25 mg tablet Take 1 tablet (25 mg total) by mouth Three (3) times a day as needed for dizziness. (Patient not taking: Reported on 10/07/2021) 30 tablet 0    meloxicam (MOBIC) 15 MG tablet       methylPREDNISolone (MEDROL DOSEPACK) 4 mg tablet       metoprolol succinate (TOPROL-XL) 100 MG 24 hr tablet Take 1 tablet (100 mg total) by mouth two (2) times a day. Repeat for elevated BP. 180 tablet 3    omeprazole (PRILOSEC) 20 MG capsule Take 1 capsule (20 mg total) by mouth two (2) times a day. 180 capsule 1    ondansetron (ZOFRAN) 4 MG tablet TAKE ONE TABLET BY MOUTH EVERY 8 HOURS AS NEEDED FOR NAUSEA AND VOMITING FOR UP TO 5 DAYS      ondansetron (ZOFRAN-ODT) 8 MG disintegrating tablet       peg-electrolyte soln (GOLYTELY) 420 gram SolR Take as directed in the instructions sent to you via your [facility] (Patient not taking: Reported on 03/29/2020) 2 Bottle 0    predniSONE (DELTASONE) 10 mg tablet pack prednisone 10 mg tablets in a dose pack   Take 1 dose pk by oral route.  pregabalin (LYRICA) 25 MG capsule Take 25 mg by mouth Two (2) times a day.      promethazine-codeine (PHENERGAN WITH CODEINE) 6.25-10 mg/5 mL syrup 1-2 tsp po every 6 hours as needed for cough and congestion. 120 mL 0    traMADoL (ULTRAM) 50 mg tablet       traMADoL (ULTRAM) 50 mg tablet tramadol 50 mg tablet   Take 1 tablet every 6-8 hours by oral route.       No current facility-administered medications for this visit.        Changes to medications: Yosha reports no changes at this time.    Allergies   Allergen Reactions    Iodine And Iodide Containing Products Anaphylaxis    Statins-Hmg-Coa Reductase Inhibitors      Side effects    Sulfa (Sulfonamide Antibiotics)     Oxycodone Nausea Only     Makes her feel weird       Changes to allergies: No    SPECIALTY MEDICATION ADHERENCE     Repatha 140 mg/ml: 15 days of medicine on hand       Medication Adherence    Patient reported X missed doses in the last month: 0  Specialty Medication: Repatha 140mg /mL  Informant: patient                            Specialty medication(s) dose(s) confirmed: Regimen is correct and unchanged.     Are there any concerns with adherence? No    Adherence counseling provided? Not needed    CLINICAL MANAGEMENT AND INTERVENTION      Clinical Benefit Assessment:    Do you feel the medicine is effective or helping your condition? Yes    Clinical Benefit counseling provided? Not needed    Adverse Effects Assessment:    Are you experiencing any side effects? No    Are you experiencing difficulty administering your medicine? No    Quality of Life Assessment:    Quality of Life    Rheumatology  Oncology  Dermatology  Cystic Fibrosis          How many days over the past month did your hyperlipidemia  keep you from your normal activities? For example, brushing your teeth or getting up in the morning. Patient declined to answer    Have you discussed this with your provider? Not needed    Acute Infection Status:    Acute infections noted within Epic:  No active infections  Patient reported infection: None    Therapy Appropriateness:    Is therapy appropriate and patient progressing towards therapeutic goals? Yes, therapy is appropriate and should be continued    DISEASE/MEDICATION-SPECIFIC INFORMATION      For patients on injectable medications: Patient currently has 1 doses left.  Next injection is scheduled for 10/11.    Cardiology: Not Applicable    PATIENT SPECIFIC NEEDS     Does the patient have any physical, cognitive, or cultural barriers? No    Is the patient high risk? No    Did the patient require a clinical intervention? No    Does the patient require physician intervention or other additional services (i.e., nutrition, smoking cessation, social work)? No    SOCIAL DETERMINANTS OF HEALTH     At the [facility], we have learned that life circumstances - like trouble affording food, housing, utilities, or transportation can affect the health of many of our patients.   That  is why we wanted to ask: are you currently experiencing any life circumstances that are negatively impacting your health and/or quality of life? Patient declined to answer    Social Determinants of Health     Financial Resource Strain: Low Risk  (03/23/2020)    Overall Financial Resource Strain (CARDIA)     Difficulty of Paying Living Expenses: Not hard at all   Internet Connectivity: Not on file   Food Insecurity: No Food Insecurity (03/23/2020)    Hunger Vital Sign     Worried About Running Out of Food in the Last Year: Never true     Ran Out of Food in the Last Year: Never true   Tobacco Use: High Risk (03/29/2020)    Patient History     Smoking Tobacco Use: Every Day     Smokeless Tobacco Use: Never     Passive Exposure: Not on file   Housing/Utilities: Unknown (03/24/2021)    Housing/Utilities     Within the past 12 months, have you ever stayed: outside, in a car, in a tent, in an overnight shelter, or temporarily in someone else's home (i.e. couch-surfing)?: No     Are you worried about losing your housing?: Not on file     Within the past 12 months, have you been unable to get utilities (heat, electricity) when it was really needed?: Not on file   Alcohol Use: Not on file   Transportation Needs: No Transportation Needs (03/23/2020)    PRAPARE - Transportation     Lack of Transportation (Medical): No     Lack of Transportation (Non-Medical): No   Substance Use: Not on file   Health Literacy: Low Risk  (03/24/2021)    Health Literacy     : Never   Physical Activity: Inactive (05/06/2019)    Exercise Vital Sign     Days of Exercise per Week: 0 days     Minutes of Exercise per Session: 0 min   Interpersonal Safety: Not on file   Stress: Stress Concern Present (05/06/2019)    [employer] of Occupational Health - Occupational Stress Questionnaire     Feeling of Stress : Very much   Intimate Partner Violence: Not At Risk (05/06/2019)    Humiliation, Afraid, Rape, and Kick questionnaire     Fear of Current or Ex-Partner: No     Emotionally Abused: No     Physically Abused: No     Sexually Abused: No   Depression: Not at risk (07/30/2018)    PHQ-2     PHQ-2 Score: 1   Social Connections: Moderately Integrated (05/06/2019)    Social Connection and Isolation Panel [NHANES]     Frequency of Communication with Friends and Family: More than three times a week     Frequency of Social Gatherings with Friends and Family: More than three times a week     Attends Religious Services: Never     Database administrator or Organizations: Yes     Attends Banker Meetings: 1 to 4 times per year     Marital Status: Living with partner       Would you be willing to receive help with any of the needs that you have identified today? Not applicable       SHIPPING     Specialty Medication(s) to be Shipped:   General Specialty: Repatha    Other medication(s) to be shipped: No additional medications requested for fill at this time  Changes to insurance: No    Delivery Scheduled: Yes, Expected medication delivery date: 07/31/22.     Medication will be delivered via Same Day Courier to the confirmed prescription address in [facility].    The patient will receive a drug information handout for each medication shipped and additional FDA Medication Guides as required.  Verified that patient has previously received a Conservation officer, historic buildings and a Surveyor, mining.    The patient or caregiver noted above participated in the development of this care plan and knows that they can request review of or adjustments to the care plan at any time.      All of the patient's questions and concerns have been addressed.    Camillo Flaming, PharmD   [facility] Specialty Pharmacist

## 2022-07-31 MED FILL — REPATHA SYRINGE 140 MG/ML SUBCUTANEOUS SYRINGE: 84 days supply | Qty: 6 | Fill #2

## 2022-08-07 ENCOUNTER — Other Ambulatory Visit: Payer: Self-pay | Admitting: Specialist

## 2022-08-07 DIAGNOSIS — R918 Other nonspecific abnormal finding of lung field: Secondary | ICD-10-CM

## 2022-08-07 DIAGNOSIS — K869 Disease of pancreas, unspecified: Secondary | ICD-10-CM

## 2022-08-29 ENCOUNTER — Ambulatory Visit
Admission: RE | Admit: 2022-08-29 | Discharge: 2022-08-29 | Disposition: A | Discharge status: Home / Self Care | Payer: 59 | Source: Ambulatory Visit | Attending: Specialist | Admitting: Specialist

## 2022-08-29 DIAGNOSIS — R918 Other nonspecific abnormal finding of lung field: Secondary | ICD-10-CM

## 2022-08-29 DIAGNOSIS — K869 Disease of pancreas, unspecified: Secondary | ICD-10-CM

## 2022-08-29 MED ORDER — GADOBENATE DIMEGLUMINE 529 MG/ML IV SOLN
20.0000 mL | Freq: Once | INTRAVENOUS | Status: AC | PRN
  Administered 2022-08-29: 20 mL via INTRAVENOUS

## 2022-09-17 ENCOUNTER — Ambulatory Visit
Admit: 2022-09-17 | Discharge: 2022-09-18 | Disposition: A | Discharge status: Home / Self Care | Payer: MEDICARE | Attending: Emergency Medicine

## 2022-09-17 ENCOUNTER — Emergency Department
Admit: 2022-09-17 | Discharge: 2022-09-18 | Disposition: A | Discharge status: Home / Self Care | Payer: MEDICARE | Attending: Emergency Medicine

## 2022-09-17 DIAGNOSIS — W19XXXA Unspecified fall, initial encounter: Principal | ICD-10-CM

## 2022-09-17 DIAGNOSIS — R55 Syncope and collapse: Principal | ICD-10-CM

## 2022-09-17 LAB — BASIC METABOLIC PANEL
ANION GAP: 8 mmol/L (ref 5–14)
BLOOD UREA NITROGEN: 18 mg/dL (ref 9–23)
BUN / CREAT RATIO: 20
CALCIUM: 9.2 mg/dL (ref 8.7–10.4)
CHLORIDE: 109 mmol/L — ABNORMAL HIGH (ref 98–107)
CO2: 26 mmol/L (ref 20.0–31.0)
CREATININE: 0.92 mg/dL
EGFR CKD-EPI (2021) FEMALE: 70 mL/min/{1.73_m2} (ref >=60)
GLUCOSE RANDOM: 63 mg/dL — ABNORMAL LOW (ref 70–179)
POTASSIUM: 3.6 mmol/L (ref 3.4–4.8)
SODIUM: 143 mmol/L (ref 135–145)

## 2022-09-17 LAB — CBC W/ AUTO DIFF
BASOPHILS ABSOLUTE COUNT: 0 10*9/L (ref 0.0–0.1)
BASOPHILS RELATIVE PERCENT: 0.3 %
EOSINOPHILS ABSOLUTE COUNT: 0.3 10*9/L (ref 0.0–0.5)
EOSINOPHILS RELATIVE PERCENT: 3.8 %
HEMATOCRIT: 34.6 % (ref 34.0–44.0)
HEMOGLOBIN: 11.1 g/dL — ABNORMAL LOW (ref 11.3–14.9)
LYMPHOCYTES ABSOLUTE COUNT: 2.7 10*9/L (ref 1.1–3.6)
LYMPHOCYTES RELATIVE PERCENT: 33.3 %
MEAN CORPUSCULAR HEMOGLOBIN CONC: 32.2 g/dL (ref 32.0–36.0)
MEAN CORPUSCULAR HEMOGLOBIN: 26.4 pg (ref 25.9–32.4)
MEAN CORPUSCULAR VOLUME: 82 fL (ref 77.6–95.7)
MEAN PLATELET VOLUME: 7.5 fL (ref 6.8–10.7)
MONOCYTES ABSOLUTE COUNT: 0.5 10*9/L (ref 0.3–0.8)
MONOCYTES RELATIVE PERCENT: 6.6 %
NEUTROPHILS ABSOLUTE COUNT: 4.5 10*9/L (ref 1.8–7.8)
NEUTROPHILS RELATIVE PERCENT: 56 %
NUCLEATED RED BLOOD CELLS: 0 /100{WBCs} (ref <=4)
PLATELET COUNT: 289 10*9/L (ref 150–450)
RED BLOOD CELL COUNT: 4.21 10*12/L (ref 3.95–5.13)
RED CELL DISTRIBUTION WIDTH: 17.1 % — ABNORMAL HIGH (ref 12.2–15.2)
WBC ADJUSTED: 8.1 10*9/L (ref 3.6–11.2)

## 2022-09-17 LAB — MAGNESIUM: MAGNESIUM: 1.8 mg/dL (ref 1.6–2.6)

## 2022-09-17 LAB — HIGH SENSITIVITY TROPONIN I - SINGLE: HIGH SENSITIVITY TROPONIN I: 3 ng/L (ref <=34)

## 2022-09-17 NOTE — Unmapped (Signed)
Pt arrives with c/o fall at 1130am.  Pt states prior to fall pt felt dizzy.  Unsure of what made her pass out; 3 falls this week.  Pt states pain to R ankle, R knee, L shoulder, L arm, and both hips.    Pt denies blood thinner.

## 2022-09-18 MED ADMIN — acetaminophen (TYLENOL) tablet 650 mg: 650 mg | ORAL | @ 04:00:00 | Stop: 2022-09-17

## 2022-09-18 NOTE — Unmapped (Addendum)
[facility]  Emergency Department Provider Note     ED Clinical Impression     Final diagnoses:   Fall, initial encounter (Primary)   Syncope, unspecified syncope type      Impression, Medical Decision Making, ED Course     Impression: 66 y.o. female who has a past medical history of Achilles tendinitis, right leg (2021), Anxiety, Arthritis, Asthma, BPPV (benign paroxysmal positional vertigo), Chronic diastolic CHF (congestive heart failure) (CMS-HCC), COPD (chronic obstructive pulmonary disease) (CMS-HCC), Depression, Fibromyalgia, GERD (gastroesophageal reflux disease), Heart disease, Heart murmur, Hyperlipidemia, Hypertension, Myocardial infarction (CMS-HCC) (2008), Obesity, Panic disorder, Substance abuse (CMS-HCC), and Urge incontinence. who presents with diffuse body aches in the setting of 3 falls with preceding dizziness in the past week as described below.     On exam, patient is alert and in no acute distress. Vital signs within normal limits. Physical exam revealed an abrasion to the right lateral lower extremity with tenderness to palpation. No focal neurologic deficits.  In regards to the patient's right lower leg pain will evaluate with x-ray, reassuringly patient has been able to ambulate, full active and passive range of motion on exam.  Patient is neurovascularly intact distally.     DDx/MDM:  Syncope High-Risk Features  History: None  Past Medical History: None  Physical Exam: None  ECG: None #     Suspected etiology includes: Orthostatic hypotension / Dehydration, Electrolyte derangement, Vasovagal syncope, Arrhythmia, and Infection. Less likely to be Head trauma / injury, Cardiac Ischemia, PE, and CVA based on history and physical exam.     Based on my evaluation, this patient is low risk, and will be Discharged with syncope clinic follow up    Diagnostic workup as below.     Orders Placed This Encounter   Procedures    Influenza/ RSV/COVID PCR    CT head WO contrast    CT Cervical Spine Wo Contrast    XR Knee 3 Views Right    XR Tibia Fibula Right    XR Trauma Hip Left    Basic Metabolic Panel    CBC w/ Differential    Magnesium    HS Troponin 0h    Ambulatory referral to Cardiology    Orthostatic vital signs    ECG 12 Lead     ED Course as of 09/18/22 1226   Tue Sep 17, 2022   2236 CT Cervical Spine Wo Contrast  IMPRESSION:     Evaluation of the lower cervical spine is severely degraded by high image noise.     No acute fracture or traumatic listhesis.     Multilevel degenerative changes most prominent at C6-C7.      No midline C-spine tenderness palpation, will defer further imaging at this time.   2236 CT head WO contrast  IMPRESSION:  Mild soft tissue swelling over the midline forehead without acute intracranial abnormality.        2305 XR Trauma Hip Left  IMPRESSION:  No acute fracture or malalignment.     2328 Independent interpretation of EKG. patient placed on 2-3 L nasal cannula, which is patient's baseline.  Normal sinus rhythm at a rate of 85, PR 154, QRS 86, QTc 478. Similar to prior EKG. No ST elevation.  CT head/C-spine negative.  X-ray tib-fib/knee/hip negative.  Viral testing negative.  BMP/magnesium within normal limits.  Initial troponin 3, per [facility] troponin protocol no indication to repeat.  CBC nonactionable, hgb 11.1, similar to baseline with no evidence of occult bleeding.  Patient with continued reassuring neurologic examination with no focal neurodeficits.  Patient does not meet criteria for high risk syncope pathway, shared decision-making discussion regarding admission versus outpatient follow-up with referral to the syncope clinic, patient would like to proceed with the syncope clinic.  I think this is an appropriate course of action.  Patient stable at time of discharge.  Discussed discharge instructions and return precautions fully with patient who expressed understanding.     Independent Interpretation of Studies: I have independently interpreted the following studies:  See ED course    Discussion of Management With Other Providers or Support Staff: I discussed the management of this patient with the:  None    Considerations Regarding Disposition/Escalation of Care and Critical Care:  Considered admission for syncope, although patient has no high risk features, discharged with syncope clinic follow-up  ____________________________________________    The case was discussed with the attending physician, who is in agreement with the above assessment and plan.      History     Chief Complaint  Chief Complaint   Patient presents with    Fall       HPI   Marie Quinn is a 66 y.o. female with past medical history as below who presents for evaluation following a fall. The patient reports she has had 3 falls with preceding lightheadedness in the past week, most recently earlier today while outside.  Reports that prior to these falls, and syncopal episode today there is a period approximately 1 minute of lightheadedness the patient experiences tunnel vision and feels nauseous.  She endorses head strike in all three falls, but no LOC until today. In her fall today she had a brief syncopal episode and fell from the [employer] vehicle as she was exiting the vehicle and states it ran into her right leg/she hit the right side of the leg on the exterior of the [employer]. She has been able to ambulate with mild pain since the fall today. She endorses normal PO intake, but has not eaten since her fall today. She denies use of EtOH or any other drugs. She denies chest pain, shortness of breath, abdominal pain, emesis, fevers, or swelling to the lower extremities.     Outside Historian(s): None    External Records Reviewed: I have reviewed 08/20/2022 Cardiology Visit. Reviewed patient's medical history.    Past Medical History:   Diagnosis Date    Achilles tendinitis, right leg 2021    Anxiety     Arthritis     Adult life    Asthma     BPPV (benign paroxysmal positional vertigo)     Chronic diastolic CHF (congestive heart failure) (CMS-HCC)     COPD (chronic obstructive pulmonary disease) (CMS-HCC)     Depression     Fibromyalgia     GERD (gastroesophageal reflux disease)     Adult life    Heart disease     Heart murmur     Hyperlipidemia     Hypertension     Myocardial infarction (CMS-HCC) 2008    s/p stent placement x 1 at [facility]    Obesity     Panic disorder     Substance abuse (CMS-HCC)     Urge incontinence        Past Surgical History:   Procedure Laterality Date    cardiac stent x 1      CHOLECYSTECTOMY      HYSTERECTOMY  1990    one ovary remains  PR COLORECTAL SCRN; HI RISK IND N/A 08/06/2019    Procedure: COLOREC CANCR SCR; COLNSCPY;  Surgeon: Charm Rings, MD;  [facility];  Service: Gastroenterology    PR ERCP REMOVE FOREIGN BODY/STENT BILIARY/PANC DUCT N/A 08/09/2019    Procedure: ENDOSCOPIC RETROGRADE CHOLANGIOPANCREATOGRAPHY (ERCP); W/ REMOVAL OF FOREIGN BODY/STENT FROM BILIARY/PANCREATIC DUCT(S);  Surgeon: Vonda Antigua, MD;  Location: [facility];  Service: Gastroenterology    PR ERCP STENT PLACEMENT BILIARY/PANCREATIC DUCT N/A 06/28/2019    Procedure: ENDOSCOPIC RETROGRADE CHOLANGIOPANCREATOGRAPHY (ERCP); WITH PLACEMENT OF ENDOSCOPIC STENT INTO BILIARY OR PANCREATIC DUCT;  Surgeon: Vonda Antigua, MD;  [facility]: [facility];  Service: Gastroenterology    PR ERCP,SPHINCTEROTOMY N/A 11/12/2018    Procedure: ERCP; W/SPHINCTEROTOMY/PAPILLOTOMY;  Surgeon: Vonda Antigua, MD;  Location: [facility];  Service: Gastroenterology    PR ERCP,W/REMOVAL STONE,BIL/PANCR DUCTS N/A 11/12/2018    Procedure: ERCP; W/ENDOSCOPIC RETROGRADE REMOVAL OF CALCULUS/CALCULI FROM BILIARY &/OR PANCREATIC DUCTS;  Surgeon: Vonda Antigua, MD;  Location: [facility];  Service: Gastroenterology    TONSILLECTOMY      TUBAL LIGATION  1990    Tubal pregnacy       No current facility-administered medications for this encounter.    Current Outpatient Medications:     albuterol 2.5 mg /3 mL (0.083 %) nebulizer solution, Inhale the contents of 1 vial (2.5 mg total) by nebulization every four (4) hours as needed for wheezing., Disp: 150 mL, Rfl: 1    albuterol HFA 90 mcg/actuation inhaler, Inhale 2 puffs every four (4) hours as needed for wheezing., Disp: 18 g, Rfl: 1    amLODIPine (NORVASC) 10 MG tablet, Take 1 tablet (10 mg total) by mouth daily., Disp: 90 tablet, Rfl: 2    aspirin (ECOTRIN) 81 MG tablet, Take 81 mg by mouth daily., Disp: , Rfl:     buPROPion (WELLBUTRIN SR) 150 MG 12 hr tablet, Take 1 tablet (150 mg total) by mouth Two (2) times a day. (Patient not taking: Reported on 10/07/2021), Disp: 60 tablet, Rfl: 3    citalopram (CELEXA) 40 MG tablet, TAKE (1) TABLET BY MOUTH EVERY DAY (Patient not taking: Reported on 10/07/2021), Disp: 90 tablet, Rfl: 3    empty container (SHARPS CONTAINER) Misc, Use as directed., Disp: 1 each, Rfl: 3    empty container Misc, Use as directed to dispose of injectable medications, Disp: 1 each, Rfl: 3    evolocumab (REPATHA SYRINGE) 140 mg/mL Syrg, Inject the contents of 1 syringe (140 mg) under the skin every fourteen (14) days. (Patient not taking: Reported on 10/07/2021), Disp: 6 mL, Rfl: 3    evolocumab (REPATHA SYRINGE) 140 mg/mL Syrg, Inject the contents of 1 syringe (140 mg) under the skin every fourteen (14) days., Disp: 6 mL, Rfl: 3    gabapentin (NEURONTIN) 100 MG capsule, , Disp: , Rfl:     glimepiride (AMARYL) 4 MG tablet, Take 4 mg by mouth daily before breakfast., Disp: , Rfl:     hydrOXYzine (ATARAX) 25 MG tablet, Take 1 Tablet by mouth every 6 hours as needed for anxiety and congestion, Disp: 30 tablet, Rfl: 0    losartan (COZAAR) 100 MG tablet, Take 1 tablet (100 mg total) by mouth daily., Disp: 90 tablet, Rfl: 1    meclizine (ANTIVERT) 25 mg tablet, Take 1 tablet (25 mg total) by mouth Three (3) times a day as needed for dizziness. (Patient not taking: Reported on 10/07/2021), Disp: 30 tablet, Rfl:  0    meloxicam (MOBIC) 15 MG tablet, , Disp: , Rfl:     methylPREDNISolone (MEDROL DOSEPACK) 4 mg tablet, , Disp: , Rfl:     metoprolol succinate (TOPROL-XL) 100 MG 24 hr tablet, Take 1 tablet (100 mg total) by mouth two (2) times a day. Repeat for elevated BP., Disp: 180 tablet, Rfl: 3    omeprazole (PRILOSEC) 20 MG capsule, Take 1 capsule (20 mg total) by mouth two (2) times a day., Disp: 180 capsule, Rfl: 1    ondansetron (ZOFRAN) 4 MG tablet, TAKE ONE TABLET BY MOUTH EVERY 8 HOURS AS NEEDED FOR NAUSEA AND VOMITING FOR UP TO 5 DAYS, Disp: , Rfl:     ondansetron (ZOFRAN-ODT) 8 MG disintegrating tablet, , Disp: , Rfl:     peg-electrolyte soln (GOLYTELY) 420 gram SolR, Take as directed in the instructions sent to you via your [facility] (Patient not taking: Reported on 03/29/2020), Disp: 2 Bottle, Rfl: 0    predniSONE (DELTASONE) 10 mg tablet pack, prednisone 10 mg tablets in a dose pack  Take 1 dose pk by oral route., Disp: , Rfl:     pregabalin (LYRICA) 25 MG capsule, Take 25 mg by mouth Two (2) times a day., Disp: , Rfl:     promethazine-codeine (PHENERGAN WITH CODEINE) 6.25-10 mg/5 mL syrup, 1-2 tsp po every 6 hours as needed for cough and congestion., Disp: 120 mL, Rfl: 0    traMADoL (ULTRAM) 50 mg tablet, , Disp: , Rfl:     traMADoL (ULTRAM) 50 mg tablet, tramadol 50 mg tablet  Take 1 tablet every 6-8 hours by oral route., Disp: , Rfl:     Allergies  Iodine and iodide containing products, Statins-hmg-coa reductase inhibitors, Sulfa (sulfonamide antibiotics), and Oxycodone    Family History  Family History   Problem Relation Age of Onset    Heart disease Father         Deacesed    Coronary artery disease Father         quadruple bypass    Mental illness Father     Asthma Father     COPD Father     Depression Father     Hypertension Father     Cancer Sister     Arthritis Daughter     Depression Daughter     Asthma Sister     Cancer Sister         Colan cancer deceased COPD Sister     Depression Sister     Early death Sister         5 yrs old    Cancer Paternal Aunt         Thyroid cancer    Cancer Maternal Grandmother         Brain cancer    Depression Mother     Diabetes Mother     Hypertension Mother     Diabetes Brother     Vision loss Maternal Aunt         Blind at birth    Substance Abuse Disorder Neg Hx     Alcohol abuse Neg Hx     Drug abuse Neg Hx        Social History  Social History     Tobacco Use    Smoking status: Every Day     Current packs/day: 0.00     Types: Cigarettes     Start date: 05/01/1994     Last attempt to quit: 05/02/2019  Years since quitting: 3.3    Smokeless tobacco: Never    Tobacco comments:     Pt smokes  1.5cpd. Pt expressed desire to quit    Vaping Use    Vaping Use: Never used   Substance Use Topics    Alcohol use: Yes    Drug use: Not Currently     Comment: not used cocaine in 13 years        Physical Exam     VITAL SIGNS:      Vitals:    09/17/22 2053 09/17/22 2118 09/17/22 2121 09/17/22 2123   BP: 120/64 115/58 126/60 151/85   Pulse:   88 92   Resp:  16 16 16    Temp:  36.8 ??C (98.2 ??F)     TempSrc:  Oral     SpO2: 96% 96% 96% 95%     Constitutional: Alert and oriented. No acute distress.  Eyes: Conjunctivae are normal.  HEENT: Normocephalic and atraumatic. Conjunctivae clear. No congestion. Moist mucous membranes.   Cardiovascular: Rate as above, regular rhythm. Normal and symmetric distal pulses. Brisk capillary refill. Normal skin turgor.  Respiratory: Normal respiratory effort. Breath sounds are normal. There are no wheezing or crackles heard.  Gastrointestinal: Soft, non-distended, non-tender.  Genitourinary: Deferred.  Musculoskeletal: Non-tender with normal range of motion in all extremities. Abrasion to the right lateral lower extremity with tenderness to palpation  Neurologic: Normal speech and language. No gross focal neurologic deficits are appreciated. Patient is moving all extremities equally, face is symmetric at rest and with speech. Cerebellar function intact. Ambulation without difficulty.   Skin: Skin is warm, dry and intact. No rash noted.  Psychiatric: Mood and affect are normal. Speech and behavior are normal.     Radiology     XR Trauma Hip Left   Final Result   No acute fracture or malalignment.         XR Tibia Fibula Right   Final Result   Soft tissue swelling about the ankle. No acute osseous abnormality of the right tib-fib.         XR Knee 3 Views Right   Final Result   No acute osseous abnormality.            CT head WO contrast   Final Result   Mild soft tissue swelling over the midline forehead without acute intracranial abnormality.            CT Cervical Spine Wo Contrast   Final Result      Evaluation of the lower cervical spine is severely degraded by high image noise.      No acute fracture or traumatic listhesis.      Multilevel degenerative changes most prominent at C6-C7.           Pertinent labs & imaging results that were available during my care of the patient were independently interpreted by me and considered in my medical decision making (see chart for details).    Portions of this record have been created using Scientist, clinical (histocompatibility and immunogenetics). Dictation errors have been sought, but may not have been identified and corrected.    Documentation assistance was provided by Nadene Rubins, Scribe, on September 17, 2022 at 8:29 PM for Cheryle Horsfall, DO.    Documentation assistance provided by the above mentioned scribe. I was present during the time the encounter was recorded. The information recorded by the scribe was done at my direction and has been reviewed and validated by me.  Cheryle Horsfall, MD  Resident  09/18/22 1226

## 2022-10-01 ENCOUNTER — Other Ambulatory Visit: Payer: Self-pay | Admitting: Gerontology

## 2022-10-01 DIAGNOSIS — R918 Other nonspecific abnormal finding of lung field: Secondary | ICD-10-CM

## 2022-10-04 ENCOUNTER — Other Ambulatory Visit: Payer: Self-pay

## 2022-10-04 ENCOUNTER — Emergency Department: Payer: 59

## 2022-10-04 DIAGNOSIS — S0083XA Contusion of other part of head, initial encounter: Secondary | ICD-10-CM | POA: Insufficient documentation

## 2022-10-04 DIAGNOSIS — W01198A Fall on same level from slipping, tripping and stumbling with subsequent striking against other object, initial encounter: Secondary | ICD-10-CM | POA: Insufficient documentation

## 2022-10-04 DIAGNOSIS — S0990XA Unspecified injury of head, initial encounter: Secondary | ICD-10-CM | POA: Diagnosis present

## 2022-10-04 DIAGNOSIS — R55 Syncope and collapse: Secondary | ICD-10-CM | POA: Diagnosis not present

## 2022-10-04 LAB — URINALYSIS, ROUTINE W REFLEX MICROSCOPIC
Bilirubin Urine: NEGATIVE
Glucose, UA: NEGATIVE mg/dL
Hgb urine dipstick: NEGATIVE
Ketones, ur: NEGATIVE mg/dL
Leukocytes,Ua: NEGATIVE
Nitrite: NEGATIVE
Protein, ur: NEGATIVE mg/dL
Specific Gravity, Urine: 1.023 (ref 1.005–1.030)
pH: 5 (ref 5.0–8.0)

## 2022-10-04 LAB — CBC
HCT: 37.5 % (ref 36.0–46.0)
Hemoglobin: 11.4 g/dL — ABNORMAL LOW (ref 12.0–15.0)
MCH: 26.1 pg (ref 26.0–34.0)
MCHC: 30.4 g/dL (ref 30.0–36.0)
MCV: 86 fL (ref 80.0–100.0)
Platelets: 318 10*3/uL (ref 150–400)
RBC: 4.36 MIL/uL (ref 3.87–5.11)
RDW: 16.1 % — ABNORMAL HIGH (ref 11.5–15.5)
WBC: 8.7 10*3/uL (ref 4.0–10.5)
nRBC: 0 % (ref 0.0–0.2)

## 2022-10-04 LAB — BASIC METABOLIC PANEL
Anion gap: 6 (ref 5–15)
BUN: 18 mg/dL (ref 8–23)
CO2: 28 mmol/L (ref 22–32)
Calcium: 8.6 mg/dL — ABNORMAL LOW (ref 8.9–10.3)
Chloride: 106 mmol/L (ref 98–111)
Creatinine, Ser: 0.86 mg/dL (ref 0.44–1.00)
GFR, Estimated: >60 mL/min (ref >60)
Glucose, Bld: 68 mg/dL — ABNORMAL LOW (ref 70–99)
Potassium: 4.1 mmol/L (ref 3.5–5.1)
Sodium: 140 mmol/L (ref 135–145)

## 2022-10-04 NOTE — ED Notes (Signed)
First Nurse Note: Pt fell tonight around 1900 hitting her head on the wall - no thinners - no LOC. Pt on 3L [place] as needed - pt on RA at this time. Endorses a headache - no meds taken PTA.    139/94 68 HR 96% on RA

## 2022-10-04 NOTE — ED Triage Notes (Addendum)
Pt comes from home via [facility] c/o fall. Pt hit back of head on floor and thinks she lost consciousness, does not remember how she fell. Pt reports she has been falling more often. Pt in NAD at this time. Denies taking blood thinners. Pt A&O x4 Denies SOB and CP

## 2022-10-05 ENCOUNTER — Emergency Department
Admission: EM | Admit: 2022-10-05 | Discharge: 2022-10-05 | Disposition: A | Discharge status: Home / Self Care | Payer: 59 | Attending: Emergency Medicine | Admitting: Emergency Medicine

## 2022-10-05 DIAGNOSIS — W19XXXA Unspecified fall, initial encounter: Secondary | ICD-10-CM

## 2022-10-05 DIAGNOSIS — S0990XA Unspecified injury of head, initial encounter: Secondary | ICD-10-CM

## 2022-10-05 DIAGNOSIS — R55 Syncope and collapse: Secondary | ICD-10-CM

## 2022-10-05 MED ORDER — ACETAMINOPHEN 500 MG PO TABS
1000.0000 mg | ORAL_TABLET | Freq: Once | ORAL | Status: AC
  Administered 2022-10-05: 1000 mg via ORAL
  Filled 2022-10-05: qty 2

## 2022-10-05 NOTE — ED Notes (Signed)
E-signature pad unavailable - Pt verbalized understanding of D/C information - no additional concerns at this time.  

## 2022-10-05 NOTE — Discharge Instructions (Addendum)

## 2022-10-05 NOTE — ED Provider Notes (Signed)
[facility] Provider Note    Event Date/Time   First MD Initiated Contact with Patient 10/05/22 [phone]     (approximate)   History   Fall and Head Injury   HPI  Sabrina Lucas is a 66 y.o. female who presents for evaluation after a fall.  She thinks that she lost consciousness but she is not entirely sure.  She said that she has been falling more often recently but she goes through these episodes and has had problems for years with falls.  She was seen 1 to 2 weeks ago at [facility] for syncope and fall as well.  She has seen Dr. Malvin Johns in the past for her neuropathy and her issues with her balance and dizziness.  She also had an appointment at [facility] with a neurologist today but was unable to make it because her ride share service did not show up.  She currently has a headache but otherwise says she feels okay.  She has no new or acute numbness or weakness in her extremities.  No chest pain or shortness of breath.  No recent fever.  She does not take blood thinners.  She says she has been dealing with this for a while and there is no 1 particular thing that seems to make it better or worse.  She did have the thought that perhaps it has to do with all of her medications because she is on multiple medications that can lead to dizziness.  She has been working with her PCP on medication adjustments that seems to be helping a little bit with the neuropathy but she may still have some ways to go.     Physical Exam   Triage Vital Signs: ED Triage Vitals  Enc Vitals Group     BP 10/04/22 2100 (!) 130/96     Pulse Rate 10/04/22 2100 74     Resp 10/04/22 2100 18     Temp 10/04/22 2100 97.8 F (36.6 C)     Temp Source 10/04/22 2100 Oral     SpO2 10/04/22 2100 96 %     Weight 10/04/22 2108 112 kg (247 lb)     Height 10/04/22 2108 1.702 m (5\' 7" )     Head Circumference --      Peak Flow --      Pain Score 10/04/22 2107 10     Pain Loc --      Pain Edu? --       Excl. in GC? --     Most recent vital signs: Vitals:   10/04/22 2100 10/05/22 0336  BP: (!) 130/96 135/76  Pulse: 74 62  Resp: 18 18  Temp: 97.8 F (36.6 C) 98 F (36.7 C)  SpO2: 96% 95%     General: Awake, no distress.  Head:  Small contusion on the crown of the head, no significant hematoma, no laceration.  Mild tenderness to palpation. CV:  Good peripheral perfusion.  Regular rate and rhythm. Resp:  Normal effort.  Speaking easily and comfortably.  No accessory muscle usage. Abd:  No distention.  Other:  Normal grip strength, strong bilateral biceps and triceps.  Normal flexion extension of lower extremities (hips and knees).  No obvious cerebellar deficits.  No dysarthria nor aphasia. MSK:  No tenderness to palpation of the cervical spine, and patient is able to flex, extend, and rotate her head and neck without any reproducible pain or tenderness.   ED Results / Procedures / Treatments  Labs (all labs ordered are listed, but only abnormal results are displayed) Labs Reviewed  BASIC METABOLIC PANEL - Abnormal; Notable for the following components:      Result Value   Glucose, Bld 68 (*)    Calcium 8.6 (*)    All other components within normal limits  CBC - Abnormal; Notable for the following components:   Hemoglobin 11.4 (*)    RDW 16.1 (*)    All other components within normal limits  URINALYSIS, ROUTINE W REFLEX MICROSCOPIC - Abnormal; Notable for the following components:   Color, Urine YELLOW (*)    APPearance HAZY (*)    All other components within normal limits  CBG MONITORING, ED     RADIOLOGY I viewed and interpreted the patient's head CT.  There is no evidence of skull fracture or intracranial bleeding.  I also read the radiologist's report, which confirmed no acute findings.    PROCEDURES:  Critical Care performed: No  Procedures   MEDICATIONS ORDERED IN ED: Medications - No data to display   IMPRESSION / MDM / ASSESSMENT AND PLAN / ED  COURSE  I reviewed the triage vital signs and the nursing notes.                              Differential diagnosis includes, but is not limited to, skull fracture, intracranial bleed, C-spine injury, electrolyte or metabolic abnormality, cardiogenic syncope, vasovagal episode.  Patient's presentation is most consistent with acute presentation with potential threat to life or bodily function.  Labs/studies ordered: CT head, urinalysis, CBC, basic metabolic panel.  Vital signs are stable and within normal limits even over an extended period of time (patient has been in the emergency department for 8 hours).  No chest pain or shortness of breath at any point.  Basic metabolic panel, CBC, and urinalysis are all reassuring with no evidence of an acute abnormality.  I considered hospitalization given syncope, but she said that this has happened to her multiple times in the past.  I can also see where she was seen at [facility] within the last couple of weeks for the same types of symptoms.  She has outpatient follow-up available to her and there is no evidence that this is an acute or emergent condition at this time.  The patient is comfortable with the plan for discharge home and says she is mostly here because her daughter insisted.  She is going to try to get back with Dr. Malvin Johns as well as to follow-up with her doctors at [facility] and I think that is very reasonable.  I encouraged plenty of oral hydration, continue with her medications, following up with her PCP and neurologist, and return if she develops new or worsening symptoms.        FINAL CLINICAL IMPRESSION(S) / ED DIAGNOSES   Final diagnoses:  Fall, initial encounter  Minor head injury, initial encounter  Syncope and collapse     Rx / DC Orders   ED Discharge Orders     None        Note:  This document was prepared using Dragon voice recognition software and may include unintentional dictation errors.   Loleta Rose, MD 10/05/22 [phone]

## 2022-10-10 ENCOUNTER — Other Ambulatory Visit: Payer: Self-pay | Admitting: Gerontology

## 2022-10-10 DIAGNOSIS — Z1231 Encounter for screening mammogram for malignant neoplasm of breast: Secondary | ICD-10-CM

## 2022-10-16 ENCOUNTER — Other Ambulatory Visit: Payer: 59

## 2022-10-18 DIAGNOSIS — I2510 Atherosclerotic heart disease of native coronary artery without angina pectoris: Principal | ICD-10-CM

## 2022-10-18 DIAGNOSIS — E785 Hyperlipidemia, unspecified: Principal | ICD-10-CM

## 2022-10-18 NOTE — Unmapped (Signed)
[facility] Refill Coordination Note    Specialty Lite Medication(s) to be Shipped:   General Specialty: Repatha    Other medication(s) to be shipped: No additional medications requested for fill at this time     Marie Quinn, DOB: 03/22/1956  Phone: There are no phone numbers on file.      All above HIPAA information was verified with patient.     Was a Nurse, learning disability used for this call? No    Changes to medications: [name] reports no changes at this time.  Changes to insurance: Yes: [employer]      REFERRAL TO PHARMACIST     Referral to the pharmacist: Not needed      [facility]     Shipping address confirmed in Epic.     Delivery Scheduled: Yes, Expected medication delivery date: 01/11.     Medication will be delivered via Same Day Courier to the prescription address in Epic WAM.    Antonietta Barcelona   [facility] Specialty Technician

## 2022-10-22 ENCOUNTER — Inpatient Hospital Stay: Admission: RE | Admit: 2022-10-22 | Payer: 59 | Source: Ambulatory Visit

## 2022-10-24 MED FILL — REPATHA SYRINGE 140 MG/ML SUBCUTANEOUS SYRINGE: 84 days supply | Qty: 6 | Fill #3

## 2022-12-12 ENCOUNTER — Ambulatory Visit: Admit: 2022-12-12 | Discharge: 2022-12-13 | Disposition: A | Discharge status: Home / Self Care | Payer: MEDICARE

## 2022-12-12 ENCOUNTER — Emergency Department: Admit: 2022-12-12 | Discharge: 2022-12-13 | Disposition: A | Discharge status: Home / Self Care | Payer: MEDICARE

## 2022-12-12 ENCOUNTER — Encounter: Payer: Self-pay | Admitting: Emergency Medicine

## 2022-12-12 ENCOUNTER — Ambulatory Visit: Admission: EM | Admit: 2022-12-12 | Discharge: 2022-12-12 | Payer: Medicare PPO

## 2022-12-12 DIAGNOSIS — R159 Full incontinence of feces: Secondary | ICD-10-CM

## 2022-12-12 DIAGNOSIS — R200 Anesthesia of skin: Secondary | ICD-10-CM | POA: Diagnosis not present

## 2022-12-12 DIAGNOSIS — M545 Low back pain, unspecified: Secondary | ICD-10-CM | POA: Diagnosis not present

## 2022-12-12 NOTE — ED Provider Notes (Addendum)
[facility]    CSN: [id number] Arrival date & time: 12/12/22  1700      History   Chief Complaint Chief Complaint  Patient presents with   Tailbone Pain    HPI Sabrina Lucas is a 67 y.o. female.   HPI  31 old female here for evaluation of back pain.  The patient is here with her neighbor for evaluation of pain in her tailbone and low back after suffering a ground-level fall after tripping 4 days ago.  She states that she is having loss of bowel control and also numbness in her inner thighs as well.    Past Medical History:  Diagnosis Date   Anxiety    Asthma    CAD (coronary artery disease)    Depression    Diabetes mellitus without complication ([place])    Type II   DOE (dyspnea on exertion)    Fibromyalgia    GERD (gastroesophageal reflux disease)    Grade II diastolic dysfunction    History of kidney stones    passed 3 kidney stones this week (02/02/21)   Hyperlipidemia    Hypertension    Myocardial infarction ([place]) 2005   1 stent   On supplemental oxygen therapy    Pancreatitis 2020   PTSD (post-traumatic stress disorder)    Sleep apnea    no cpap. on back order x 2.5 years!! uses nasal prongs at night    Patient Active Problem List   Diagnosis Date Noted   Urge incontinence 08/13/2018   Anxiety 07/30/2018   Asthma 07/30/2018   Depression 07/30/2018   Fibromyalgia 07/30/2018   Heart disease 07/30/2018   Panic disorder 07/30/2018   Post traumatic stress disorder (PTSD) 07/30/2018   Hypertension 07/30/2018   Chest pain 08/07/2012   Myocardial infarction ([place]) 10/14/2006    Past Surgical History:  Procedure Laterality Date   ABDOMINAL HYSTERECTOMY     partial   ACHILLES TENDON SURGERY Right 02/08/2021   Procedure: ACHILLES TENDON REPAIR, SECONDARY- RIGHT;  Surgeon: Caroline More, DPM;  Location: [facility];  Service: Podiatry;  Laterality: Right;   CARDIAC CATHETERIZATION  2005   1 stent   CARDIAC SURGERY  2005   stent x 1    CHOLECYSTECTOMY  2016   GASTROC RECESSION EXTREMITY Right 02/08/2021   Procedure: GASTROC RECESSION- RIGHT;  Surgeon: Caroline More, DPM;  Location: [facility];  Service: Podiatry;  Laterality: Right;   MOUTH SURGERY  2003   teeth removed in their entirety   OSTECTOMY Right 02/08/2021   Procedure: OSTECTOMY- HAGLUNDS/RETROCALCANEAL;  Surgeon: Caroline More, DPM;  Location: [facility];  Service: Podiatry;  Laterality: Right;   pancreas stent  06/2019   TENDON TRANSFER Right 02/08/2021   Procedure: FHL TRANSFER; DEEP- RIGHT;  Surgeon: Caroline More, DPM;  Location: [facility];  Service: Podiatry;  Laterality: Right;   TONSILLECTOMY  1983    OB History   No obstetric history on file.      Home Medications    Prior to Admission medications   Medication Sig Start Date End Date Taking? Authorizing Provider  albuterol (PROVENTIL) (2.5 MG/3ML) 0.083% nebulizer solution Take 2.5 mg by nebulization every 6 (six) hours as needed for wheezing or shortness of breath.   Yes [provider]  albuterol (VENTOLIN HFA) 108 (90 Base) MCG/ACT inhaler Inhale 1-2 puffs into the lungs every 6 (six) hours as needed for wheezing or shortness of breath.   Yes [provider]  amLODipine (NORVASC) 10 MG tablet Take 10  mg by mouth daily. 11/16/20  Yes [provider]  aspirin 81 MG EC tablet Take 1 tablet (81 mg total) by mouth daily. 02/09/21  Yes Caroline More, DPM  atorvastatin (LIPITOR) 40 MG tablet    Yes [provider]  Cholecalciferol 50 MCG (2000 UT) CAPS Take 6,000 Units by mouth daily. 05/26/20  Yes [provider]  citalopram (CELEXA) 40 MG tablet    Yes [provider]  DULoxetine (CYMBALTA) 60 MG capsule Take 60 mg by mouth daily. 01/11/21  Yes [provider]  fexofenadine (ALLEGRA) 180 MG tablet Take 180 mg by mouth daily. 11/14/20 12/12/22 Yes [provider]  glimepiride (AMARYL) 1 MG tablet Take 1 mg by mouth daily before breakfast.  01/11/21  Yes [provider]  hydrOXYzine (ATARAX/VISTARIL) 25 MG tablet Take 25 mg by mouth every 6 (six) hours as needed for anxiety. 07/30/18  Yes [provider]  lamoTRIgine (LAMICTAL) 100 MG tablet Take 100 mg by mouth 2 (two) times daily.   Yes [provider]  lamoTRIgine (LAMICTAL) 25 MG tablet Take 50 mg by mouth daily.   Yes [provider]  losartan (COZAAR) 100 MG tablet Take 100 mg by mouth daily. 12/21/20  Yes [provider]  metoprolol succinate (TOPROL-XL) 100 MG 24 hr tablet Take 100 mg by mouth daily.   Yes [provider]  nitroGLYCERIN (NITROSTAT) 0.4 MG SL tablet Place under the tongue. 04/03/22 04/03/23 Yes [provider]  omeprazole (PRILOSEC) 20 MG capsule Take 20 mg by mouth 2 (two) times daily before a meal. 07/30/18  Yes [provider]  ondansetron (ZOFRAN ODT) 4 MG disintegrating tablet Take 1 tablet (4 mg total) by mouth every 8 (eight) hours as needed for nausea or vomiting. 08/14/21  Yes Paulette Blanch, MD  ondansetron (ZOFRAN) 4 MG tablet Take 1 tablet by mouth every 8 (eight) hours as needed. 07/31/21  Yes [provider]  oxybutynin (DITROPAN) 5 MG tablet Take 5 mg by mouth daily.   Yes [provider]  OXYGEN Inhale into the lungs. 2 liters by nasal canula prn   Yes [provider]  pregabalin (LYRICA) 75 MG capsule Take 75 mg by mouth in the morning, at noon, in the evening, and at bedtime. 01/11/21  Yes [provider]  REPATHA 140 MG/ML SOSY Inject 140 mg into the skin every 14 (fourteen) days. 12/19/20  Yes [provider]  RYBELSUS 3 MG TABS Take 1 tablet by mouth every morning.   Yes [provider]  SYMBICORT 160-4.5 MCG/ACT inhaler Inhale 2 puffs into the lungs in the morning and at bedtime. 01/11/21  Yes [provider]  acetaminophen (TYLENOL) 500 MG tablet Take 1,000 mg by mouth every 6 (six) hours as needed.    [provider]  ARIPiprazole (ABILIFY) 2 MG tablet Take 4 mg by mouth daily. 01/11/21   [provider]  azithromycin (ZITHROMAX) 250 MG tablet Take 1 tablet (250 mg total) by mouth daily. Take first 2 tablets together, then 1 every day until finished. Patient not taking: Reported on 01/18/2021 12/11/20   Verda Cumins, MD  buPROPion [facility] SR) 200 MG 12 hr tablet Take 200 mg by mouth 2 (two) times daily. 10/25/20   [provider]  Cinnamon 500 MG capsule Take 500 mg by mouth daily.    [provider]  Echinacea 400 MG CAPS Take by mouth.    [provider]  enoxaparin (LOVENOX) 40 MG/0.4ML injection  Inject 0.4 mLs (40 mg total) into the skin daily. 02/09/21   Caroline More, DPM  Glucosamine-Chondroitin (GLUCOSAMINE CHONDR COMPLEX PO) Take 1,200 mg by mouth.    [provider]  meclizine (ANTIVERT) 25 MG tablet Take 1 tablet (25 mg total) by mouth 3 (three) times daily as needed for dizziness or nausea. 08/14/21   Paulette Blanch, MD  montelukast (SINGULAIR) 10 MG tablet Take 10 mg by mouth at bedtime. 01/11/21   [provider]  orphenadrine (NORFLEX) 100 MG tablet Take 1 tablet (100 mg total) by mouth 2 (two) times daily as needed for muscle spasms. Patient not taking: Reported on 01/18/2021 08/16/18   Katy Apo, NP  potassium chloride (KLOR-CON) 10 MEQ tablet Take 10 mEq by mouth daily. 01/11/21   [provider]  tiZANidine (ZANAFLEX) 4 MG tablet Take 4 mg by mouth 3 (three) times daily.    [provider]  torsemide (DEMADEX) 10 MG tablet Take 10 mg by mouth daily. 01/11/21   [provider]  traMADol (ULTRAM) 50 MG tablet TAKE 1 TABLET BY MOUTH BY MOUTH EVERY 6 HOURS    [provider]  zinc gluconate 50 MG tablet Take 100 mg by mouth daily.    [provider]    Family History Family History  Problem Relation Age of Onset   Diabetes Mother    Hypertension Mother    Heart disease Father      Social History Social History   Tobacco Use   Smoking status: Former    Types: Cigarettes    Quit date: 07/30/2020    Years since quitting: 2.3   Smokeless tobacco: Never  Vaping Use   Vaping Use: Former   Quit date: 01/28/2018  Substance Use Topics   Alcohol use: Yes    Comment: occasionally   Drug use: Never     Allergies   Iodinated contrast media, Percocet [oxycodone-acetaminophen], Statins, Sulfa antibiotics, and Tape   Review of Systems Review of Systems   Physical Exam Triage Vital Signs ED Triage Vitals  Enc Vitals Group     BP      Pulse      Resp      Temp      Temp src      SpO2      Weight      Height      Head Circumference      Peak Flow      Pain Score      Pain Loc      Pain Edu?      Excl. in [place]?    No data found.  Updated Vital Signs BP (!) 108/55 (BP Location: Left Arm)   Pulse (!) 56   Temp 98.4 F (36.9 C) (Oral)   Resp 16   SpO2 98%   Visual Acuity Right Eye Distance:   Left Eye Distance:   Bilateral Distance:    Right Eye Near:   Left Eye Near:    Bilateral Near:     Physical Exam   UC Treatments / Results  Labs (all labs ordered are listed, but only abnormal results are displayed) Labs Reviewed - No data to display  EKG   Radiology No results found.  Procedures Procedures (including critical care time)  Medications Ordered in UC Medications - No data to display  Initial Impression / Assessment and Plan / UC Course  I have reviewed the triage vital signs and the nursing notes.  Pertinent  labs & imaging results that were available during my care of the patient were reviewed by me and considered in my medical decision making (see chart for details).   Given patient's report of saddle anesthesia and bowel incontinence following a fall 4 days ago I have recommended that she go to the emergency department for evaluation as she will need an emergent MRI.  She has elected to go to [facility].  Her  neighbor will take her via POV.   Final Clinical Impressions(s) / UC Diagnoses   Final diagnoses:  Acute midline low back pain, unspecified whether sciatica present  Incontinence of feces, unspecified fecal incontinence type  Saddle anesthesia     Discharge Instructions      Please go to the emergency department for the evaluation of your back pain, bowel incontinence, and numbness in your inner thighs.     ED Prescriptions   None    PDMP not reviewed this encounter.   Margarette Canada, NP 12/12/22 1746    Margarette Canada, NP 12/12/22 1747

## 2022-12-12 NOTE — ED Triage Notes (Signed)
Pt fell at home 4 days ago and landed on her butt. She c/o back and tail bone pain.

## 2022-12-12 NOTE — Discharge Instructions (Addendum)
Please go to the emergency department for the evaluation of your back pain, bowel incontinence, and numbness in your inner thighs.

## 2022-12-12 NOTE — ED Notes (Signed)
Patient is being discharged from the Urgent Care and sent to the Emergency Department via POV . Per Margarette Canada, NP patient is in need of higher level of care due to back pain, bowel incontinence, and numbness inner thighs. Patient is aware and verbalizes understanding of plan of care.  Vitals:   12/12/22 1738  BP: (!) 108/55  Pulse: (!) 56  Resp: 16  Temp: 98.4 F (36.9 C)  SpO2: 98%

## 2022-12-13 MED ADMIN — lidocaine 4 % patch 2 patch: 2 | TRANSDERMAL | @ 05:00:00

## 2022-12-13 MED ADMIN — methocarbamol (ROBAXIN) tablet 1,000 mg: 1000 mg | ORAL | @ 05:00:00 | Stop: 2022-12-12

## 2022-12-13 MED ADMIN — acetaminophen (TYLENOL) tablet 650 mg: 650 mg | ORAL | @ 05:00:00 | Stop: 2022-12-12

## 2022-12-13 MED ADMIN — LORazepam (ATIVAN) tablet 1 mg: 1 mg | ORAL | @ 12:00:00 | Stop: 2022-12-13

## 2022-12-13 MED ADMIN — oxyCODONE (ROXICODONE) immediate release tablet 5 mg: 5 mg | ORAL | @ 08:00:00 | Stop: 2022-12-13

## 2022-12-13 NOTE — Unmapped (Signed)
ED Progress Note    This is a 67 year old female transferred from [facility] for MRI for concern of spinal cord compression after a fall.     MRI without evidence of fracture or cord compression.  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  I did inform her of the results.  She is able to ambulate, and okay with plan for discharge home

## 2022-12-13 NOTE — Unmapped (Signed)
[facility]  Emergency Department Provider Note      ED Clinical Impression      Final diagnoses:   None     Back pain, bowel incontinence       Impression, Medical Decision Making, Progress Notes and Critical Care      Impression, Differential Diagnosis and Plan of Care    Back pain  -Patient with midline lumbar tenderness, saddle anesthesia, bowel incontinence concerning for possible spinal cord compression related to traumatic injury, no MRI overnight at [facility]  -will proceed with ED to ED transfer for emergent MRI at [facility]        Discussion of Management with other Physicians, QHP or Appropriate Source: na  Independent Interpretation of Studies: na  External Records Reviewed: Patient's most recent outpatient clinic note  Escalation of Care, Consideration of Admission/Observation/Transfer: transfer for emergent MRI given bowel incontinence and back pain  Social determinants that significantly affected care: None applicable  Prescription drug(s) considered but not prescribed: na  Diagnostic tests considered but not performed: CT, needs mri  History obtained from other sources: Outside Provider    Additional Progress Notes        Portions of this record have been created using Dragon dictation software. Dictation errors have been sought, but may not have been identified and corrected.    See chart and resident provider documentation for details.    ____________________________________________         History        Reason for Visit  Back Pain      HPI   Marie Quinn is a 67 y.o. female presenting for back pain after fall. Pt fell Sunday onto her tailbone, had severe back pain following fall, took tylenol and robaxin with minimal relief. Pain has remained the same since the fall, endorsing sharp midline  lower back pain. She has been able to ambulate with some difficulty. However today noticed numbness to inner thighs, left > right, as well as bowel incontinence starting today. Had multiple Bms without realizing it happened while in bed. No fevers/chills.     Past Medical History:   Diagnosis Date    Achilles tendinitis, right leg 2021    Anxiety     Arthritis     Adult life    Asthma     BPPV (benign paroxysmal positional vertigo)     Chronic diastolic CHF (congestive heart failure) (CMS-HCC)     COPD (chronic obstructive pulmonary disease) (CMS-HCC)     Depression     Fibromyalgia     GERD (gastroesophageal reflux disease)     Adult life    Heart disease     Heart murmur     Hyperlipidemia     Hypertension     Myocardial infarction (CMS-HCC) 2008    s/p stent placement x 1 at [facility]    Obesity     Panic disorder     Substance abuse (CMS-HCC)     Urge incontinence        Patient Active Problem List   Diagnosis    Coronary artery disease involving native coronary artery of native heart without angina pectoris    Hypertension    Myocardial infarction (CMS-HCC)    Mild intermittent asthma without complication    Anxiety    Depression    Post traumatic stress disorder (PTSD)    Fibromyalgia    Panic disorder    Chest pain    Urge incontinence  Back spasm    Dyslipidemia    Epigastric pain    Nausea & vomiting    Elevated liver enzymes    Acute pancreatitis with infected necrosis    Tobacco abuse    Chronic diastolic CHF (congestive heart failure) (CMS-HCC)    Recurrent acute pancreatitis    Low back pain    Bilateral leg pain    Obstructive sleep apnea       Past Surgical History:   Procedure Laterality Date    cardiac stent x 1      CHOLECYSTECTOMY      HYSTERECTOMY  1990    one ovary remains    PR COLORECTAL SCRN; HI RISK IND N/A 08/06/2019    Procedure: COLOREC CANCR SCR; COLNSCPY;  Surgeon: Charm Rings, MD;  [facility];  Service: Gastroenterology    PR ERCP REMOVE FOREIGN BODY/STENT BILIARY/PANC DUCT N/A 08/09/2019    Procedure: ENDOSCOPIC RETROGRADE CHOLANGIOPANCREATOGRAPHY (ERCP); W/ REMOVAL OF FOREIGN BODY/STENT FROM BILIARY/PANCREATIC DUCT(S);  Surgeon: Vonda Antigua, MD;  Location: [facility];  Service: Gastroenterology    PR ERCP STENT PLACEMENT BILIARY/PANCREATIC DUCT N/A 06/28/2019    Procedure: ENDOSCOPIC RETROGRADE CHOLANGIOPANCREATOGRAPHY (ERCP); WITH PLACEMENT OF ENDOSCOPIC STENT INTO BILIARY OR PANCREATIC DUCT;  Surgeon: Vonda Antigua, MD;  [facility]: [facility];  Service: Gastroenterology    PR ERCP,SPHINCTEROTOMY N/A 11/12/2018    Procedure: ERCP; W/SPHINCTEROTOMY/PAPILLOTOMY;  Surgeon: Vonda Antigua, MD;  Location: [facility];  Service: Gastroenterology    PR ERCP,W/REMOVAL STONE,BIL/PANCR DUCTS N/A 11/12/2018    Procedure: ERCP; W/ENDOSCOPIC RETROGRADE REMOVAL OF CALCULUS/CALCULI FROM BILIARY &/OR PANCREATIC DUCTS;  Surgeon: Vonda Antigua, MD;  [facility];  Service: Gastroenterology    TONSILLECTOMY      TUBAL LIGATION  1990    Tubal pregnacy       No current facility-administered medications for this encounter.    Current Outpatient Medications:     albuterol 2.5 mg /3 mL (0.083 %) nebulizer solution, Inhale the contents of 1 vial (2.5 mg total) by nebulization every four (4) hours as needed for wheezing., Disp: 150 mL, Rfl: 1    albuterol HFA 90 mcg/actuation inhaler, Inhale 2 puffs every four (4) hours as needed for wheezing., Disp: 18 g, Rfl: 1    amLODIPine (NORVASC) 10 MG tablet, Take 1 tablet (10 mg total) by mouth daily., Disp: 90 tablet, Rfl: 2    aspirin (ECOTRIN) 81 MG tablet, Take 81 mg by mouth daily., Disp: , Rfl:     buPROPion (WELLBUTRIN SR) 150 MG 12 hr tablet, Take 1 tablet (150 mg total) by mouth Two (2) times a day. (Patient not taking: Reported on 10/07/2021), Disp: 60 tablet, Rfl: 3    citalopram (CELEXA) 40 MG tablet, TAKE (1) TABLET BY MOUTH EVERY DAY (Patient not taking: Reported on 10/07/2021), Disp: 90 tablet, Rfl: 3    empty container (SHARPS CONTAINER) Misc, Use as directed., Disp: 1 each, Rfl: 3    empty container Misc, Use as directed to dispose of injectable medications, Disp: 1 each, Rfl: 3    evolocumab (REPATHA SYRINGE) 140 mg/mL Syrg, Inject the contents of 1 syringe (140 mg) under the skin every fourteen (14) days. (Patient not taking: Reported on 10/07/2021), Disp: 6 mL, Rfl: 3    evolocumab (REPATHA SYRINGE) 140 mg/mL Syrg, Inject the contents of 1 syringe (140 mg) under the skin every fourteen (14) days., Disp: 6 mL, Rfl: 3  gabapentin (NEURONTIN) 100 MG capsule, , Disp: , Rfl:     glimepiride (AMARYL) 4 MG tablet, Take 4 mg by mouth daily before breakfast., Disp: , Rfl:     hydrOXYzine (ATARAX) 25 MG tablet, Take 1 Tablet by mouth every 6 hours as needed for anxiety and congestion, Disp: 30 tablet, Rfl: 0    losartan (COZAAR) 100 MG tablet, Take 1 tablet (100 mg total) by mouth daily., Disp: 90 tablet, Rfl: 1    meclizine (ANTIVERT) 25 mg tablet, Take 1 tablet (25 mg total) by mouth Three (3) times a day as needed for dizziness. (Patient not taking: Reported on 10/07/2021), Disp: 30 tablet, Rfl: 0    meloxicam (MOBIC) 15 MG tablet, , Disp: , Rfl:     methylPREDNISolone (MEDROL DOSEPACK) 4 mg tablet, , Disp: , Rfl:     metoprolol succinate (TOPROL-XL) 100 MG 24 hr tablet, Take 1 tablet (100 mg total) by mouth two (2) times a day. Repeat for elevated BP., Disp: 180 tablet, Rfl: 3    omeprazole (PRILOSEC) 20 MG capsule, Take 1 capsule (20 mg total) by mouth two (2) times a day., Disp: 180 capsule, Rfl: 1    ondansetron (ZOFRAN) 4 MG tablet, TAKE ONE TABLET BY MOUTH EVERY 8 HOURS AS NEEDED FOR NAUSEA AND VOMITING FOR UP TO 5 DAYS, Disp: , Rfl:     ondansetron (ZOFRAN-ODT) 8 MG disintegrating tablet, , Disp: , Rfl:     peg-electrolyte soln (GOLYTELY) 420 gram SolR, Take as directed in the instructions sent to you via your [facility] (Patient not taking: Reported on 03/29/2020), Disp: 2 Bottle, Rfl: 0    predniSONE (DELTASONE) 10 mg tablet pack, prednisone 10 mg tablets in a dose pack  Take 1 dose pk by oral route., Disp: , Rfl:     pregabalin (LYRICA) 25 MG capsule, Take 25 mg by mouth Two (2) times a day., Disp: , Rfl:     promethazine-codeine (PHENERGAN WITH CODEINE) 6.25-10 mg/5 mL syrup, 1-2 tsp po every 6 hours as needed for cough and congestion., Disp: 120 mL, Rfl: 0    traMADoL (ULTRAM) 50 mg tablet, , Disp: , Rfl:     traMADoL (ULTRAM) 50 mg tablet, tramadol 50 mg tablet  Take 1 tablet every 6-8 hours by oral route., Disp: , Rfl:     Allergies  Iodine and iodide containing products, Statins-hmg-coa reductase inhibitors, Sulfa (sulfonamide antibiotics), and Oxycodone    Family History   Problem Relation Age of Onset    Heart disease Father         Deacesed    Coronary artery disease Father         quadruple bypass    Mental illness Father     Asthma Father     COPD Father     Depression Father     Hypertension Father     Cancer Sister     Arthritis Daughter     Depression Daughter     Asthma Sister     Cancer Sister         Colan cancer deceased    COPD Sister     Depression Sister     Early death Sister         62 yrs old    Cancer Paternal Aunt         Thyroid cancer    Cancer Maternal Grandmother         Brain cancer    Depression Mother     Diabetes  Mother     Hypertension Mother     Diabetes Brother     Vision loss Maternal Aunt         Blind at birth    Substance Abuse Disorder Neg Hx     Alcohol abuse Neg Hx     Drug abuse Neg Hx        Social History  Social History     Tobacco Use    Smoking status: Every Day     Current packs/day: 0.00     Types: Cigarettes     Start date: 05/01/1994     Last attempt to quit: 05/02/2019     Years since quitting: 3.6    Smokeless tobacco: Never    Tobacco comments:     Pt smokes  1.5cpd. Pt expressed desire to quit    Vaping Use    Vaping status: Never Used   Substance Use Topics    Alcohol use: Yes    Drug use: Not Currently     Comment: not used cocaine in 13 years          Physical Exam     This provider entered the patient's room: Yes:    If this provider did not enter the room, a comprehensive physical exam was not able to be performed due to increased infection risk to themselves, other providers, staff and other patients), as well as to conserve personal protective equipment (PPE) utilization during the COVID-19 pandemic.    If this provider did enter the patient room, the following was PPE worn: Surgical mask, eye protection and gloves     ED Triage Vitals [12/12/22 1839]   Enc Vitals Group      BP (S) 158/111      Heart Rate 69      SpO2 Pulse       Resp 22      Temp 36.6 ??C (97.8 ??F)      Temp Source Oral      SpO2 96 %      Weight (!) 111.1 kg (245 lb)      Height       Head Circumference       Peak Flow       Pain Score       Pain Loc       Pain Edu?       Excl. in [place]?        Constitutional: Alert and oriented. Well appearing and in no distress.  Eyes: Conjunctivae are normal.  ENT       Head: Normocephalic and atraumatic.       Nose: No congestion.       Mouth/Throat: Mucous membranes are moist.       Neck: No stridor.  Hematological/Lymphatic/Immunilogical: No cervical lymphadenopathy.  Cardiovascular: Normal rate, regular rhythm. Normal and symmetric distal pulses are present in all extremities.  Respiratory: Normal respiratory effort. Breath sounds are normal.  Gastrointestinal: Soft and nontender. There is no CVA tenderness.  Musculoskeletal: Normal range of motion in all extremities.       Right lower leg: No tenderness or edema.       Left lower leg: No tenderness or edema.  Neurologic: Normal speech and language. Midline lumbar spinal tenderness, decreased sensation to L inner thigh and saddle anesthesia  Skin: Skin is warm, dry and intact. No rash noted.  Psychiatric: Mood and affect are normal. Speech and behavior are normal.       Radiology  No orders to display        Procedures                    Clovis Fredrickson, MD  12/12/22 2355

## 2022-12-13 NOTE — Unmapped (Addendum)
Pt sent from UC in [place] for Mri.  States fell on Sunday and has no relief.  Pt ambulatory to triage.  States her legs feel  numb. Tripped over her belongings and landed on her butt.  Has not taken her bp medicine today.  Pt reports incontinent of bowel function @ times.

## 2022-12-18 ENCOUNTER — Ambulatory Visit
Admit: 2022-12-18 | Discharge: 2022-12-21 | Disposition: A | Discharge status: Home Health | Payer: MEDICARE | Admitting: Student in an Organized Health Care Education/Training Program

## 2022-12-18 ENCOUNTER — Ambulatory Visit: Admit: 2022-12-18 | Discharge: 2022-12-21 | Payer: MEDICARE

## 2022-12-18 LAB — CBC W/ AUTO DIFF
BASOPHILS ABSOLUTE COUNT: 0 10*9/L (ref 0.0–0.1)
BASOPHILS RELATIVE PERCENT: 0.4 %
EOSINOPHILS ABSOLUTE COUNT: 0.1 10*9/L (ref 0.0–0.5)
EOSINOPHILS RELATIVE PERCENT: 1.2 %
HEMATOCRIT: 35.2 % (ref 34.0–44.0)
HEMOGLOBIN: 11.3 g/dL (ref 11.3–14.9)
LYMPHOCYTES ABSOLUTE COUNT: 2.4 10*9/L (ref 1.1–3.6)
LYMPHOCYTES RELATIVE PERCENT: 19.2 %
MEAN CORPUSCULAR HEMOGLOBIN CONC: 32.2 g/dL (ref 32.0–36.0)
MEAN CORPUSCULAR HEMOGLOBIN: 27 pg (ref 25.9–32.4)
MEAN CORPUSCULAR VOLUME: 83.8 fL (ref 77.6–95.7)
MEAN PLATELET VOLUME: 8.5 fL (ref 6.8–10.7)
MONOCYTES ABSOLUTE COUNT: 0.8 10*9/L (ref 0.3–0.8)
MONOCYTES RELATIVE PERCENT: 6.2 %
NEUTROPHILS ABSOLUTE COUNT: 9.1 10*9/L — ABNORMAL HIGH (ref 1.8–7.8)
NEUTROPHILS RELATIVE PERCENT: 73 %
NUCLEATED RED BLOOD CELLS: 0 /100{WBCs} (ref <=4)
PLATELET COUNT: 219 10*9/L (ref 150–450)
RED BLOOD CELL COUNT: 4.2 10*12/L (ref 3.95–5.13)
RED CELL DISTRIBUTION WIDTH: 17.1 % — ABNORMAL HIGH (ref 12.2–15.2)
WBC ADJUSTED: 12.5 10*9/L — ABNORMAL HIGH (ref 3.6–11.2)

## 2022-12-18 LAB — TOXICOLOGY SCREEN, URINE
AMPHETAMINE SCREEN URINE: POSITIVE — AB
BARBITURATE SCREEN URINE: NEGATIVE
BENZODIAZEPINE SCREEN, URINE: NEGATIVE
BUPRENORPHINE, URINE SCREEN: NEGATIVE
CANNABINOID SCREEN URINE: POSITIVE — AB
COCAINE(METAB.)SCREEN, URINE: NEGATIVE
FENTANYL SCREEN, URINE: NEGATIVE
METHADONE SCREEN, URINE: NEGATIVE
OPIATE SCREEN URINE: NEGATIVE
OXYCODONE SCREEN URINE: NEGATIVE

## 2022-12-18 LAB — COMPREHENSIVE METABOLIC PANEL
ALBUMIN: 2.8 g/dL — ABNORMAL LOW (ref 3.4–5.0)
ALKALINE PHOSPHATASE: 81 U/L (ref 46–116)
ALT (SGPT): 14 U/L (ref 10–49)
ANION GAP: 6 mmol/L (ref 5–14)
AST (SGOT): 15 U/L (ref <=34)
BILIRUBIN TOTAL: 0.3 mg/dL (ref 0.3–1.2)
BLOOD UREA NITROGEN: 30 mg/dL — ABNORMAL HIGH (ref 9–23)
BUN / CREAT RATIO: 12
CALCIUM: 8.7 mg/dL (ref 8.7–10.4)
CHLORIDE: 107 mmol/L (ref 98–107)
CO2: 29.2 mmol/L (ref 20.0–31.0)
CREATININE: 2.46 mg/dL — ABNORMAL HIGH
EGFR CKD-EPI (2021) FEMALE: 21 mL/min/{1.73_m2} — ABNORMAL LOW (ref >=60)
GLUCOSE RANDOM: 105 mg/dL (ref 70–179)
POTASSIUM: 3.5 mmol/L (ref 3.4–4.8)
PROTEIN TOTAL: 6.2 g/dL (ref 5.7–8.2)
SODIUM: 142 mmol/L (ref 135–145)

## 2022-12-18 LAB — ACETAMINOPHEN LEVEL: ACETAMINOPHEN LEVEL: <2 ug/mL (ref <=20.0)

## 2022-12-18 LAB — URINALYSIS WITH MICROSCOPY WITH CULTURE REFLEX
BLOOD UA: NEGATIVE
GLUCOSE UA: NEGATIVE
HYALINE CASTS: 20 /LPF — ABNORMAL HIGH (ref 0–1)
NITRITE UA: NEGATIVE
PH UA: 5 (ref 5.0–9.0)
PROTEIN UA: 30 — AB
RBC UA: 3 /HPF (ref <=4)
SPECIFIC GRAVITY UA: 1.032 — ABNORMAL HIGH (ref 1.003–1.030)
SQUAMOUS EPITHELIAL: 7 /HPF — ABNORMAL HIGH (ref 0–5)
UROBILINOGEN UA: 3 — AB
WBC UA: 8 /HPF — ABNORMAL HIGH (ref 0–5)

## 2022-12-18 LAB — BLOOD GAS, VENOUS
BASE EXCESS VENOUS: 1.9 (ref -2.0–2.0)
HCO3 VENOUS: 25 mmol/L (ref 22–27)
O2 SATURATION VENOUS: 68.6 % (ref 40.0–85.0)
PCO2 VENOUS: 57 mmHg (ref 40–60)
PH VENOUS: 7.3 — ABNORMAL LOW (ref 7.32–7.43)
PO2 VENOUS: 42 mmHg — ABNORMAL HIGH (ref 35–40)

## 2022-12-18 LAB — SALICYLATE LEVEL: SALICYLATE LEVEL: <3 mg/dL (ref <=30.0)

## 2022-12-18 LAB — ETHANOL: ETHANOL: <10 mg/dL (ref <=10.0)

## 2022-12-19 LAB — BASIC METABOLIC PANEL
ANION GAP: 5 mmol/L (ref 5–14)
BLOOD UREA NITROGEN: 22 mg/dL (ref 9–23)
BUN / CREAT RATIO: 14
CALCIUM: 8.9 mg/dL (ref 8.7–10.4)
CHLORIDE: 109 mmol/L — ABNORMAL HIGH (ref 98–107)
CO2: 30.4 mmol/L (ref 20.0–31.0)
CREATININE: 1.53 mg/dL — ABNORMAL HIGH
EGFR CKD-EPI (2021) FEMALE: 38 mL/min/{1.73_m2} — ABNORMAL LOW (ref >=60)
GLUCOSE RANDOM: 59 mg/dL — ABNORMAL LOW (ref 70–179)
POTASSIUM: 3.3 mmol/L — ABNORMAL LOW (ref 3.4–4.8)
SODIUM: 144 mmol/L (ref 135–145)

## 2022-12-19 LAB — FOLATE: FOLATE: 8.9 ng/mL (ref >=5.4)

## 2022-12-19 LAB — VITAMIN B12: VITAMIN B-12: 808 pg/mL (ref 211–911)

## 2022-12-19 LAB — CBC
HEMATOCRIT: 36 % (ref 34.0–44.0)
HEMOGLOBIN: 11.6 g/dL (ref 11.3–14.9)
MEAN CORPUSCULAR HEMOGLOBIN CONC: 32.3 g/dL (ref 32.0–36.0)
MEAN CORPUSCULAR HEMOGLOBIN: 27.2 pg (ref 25.9–32.4)
MEAN CORPUSCULAR VOLUME: 84.1 fL (ref 77.6–95.7)
MEAN PLATELET VOLUME: 8.2 fL (ref 6.8–10.7)
PLATELET COUNT: 202 10*9/L (ref 150–450)
RED BLOOD CELL COUNT: 4.28 10*12/L (ref 3.95–5.13)
RED CELL DISTRIBUTION WIDTH: 17.1 % — ABNORMAL HIGH (ref 12.2–15.2)
WBC ADJUSTED: 9.7 10*9/L (ref 3.6–11.2)

## 2022-12-19 LAB — MAGNESIUM: MAGNESIUM: 1.5 mg/dL — ABNORMAL LOW (ref 1.6–2.6)

## 2022-12-19 LAB — HEMOGLOBIN A1C
ESTIMATED AVERAGE GLUCOSE: 131 mg/dL
HEMOGLOBIN A1C: 6.2 % — ABNORMAL HIGH (ref 4.8–5.6)

## 2022-12-19 LAB — TSH: THYROID STIMULATING HORMONE: 1.093 u[IU]/mL (ref 0.550–4.780)

## 2022-12-19 MED ADMIN — cefTRIAXone (ROCEPHIN) 1 g in sodium chloride 0.9 % (NS) 100 mL IVPB-MBP: 1 g | INTRAVENOUS | @ 05:00:00

## 2022-12-19 MED ADMIN — potassium chloride ER tablet 40 mEq: 40 meq | ORAL | @ 13:00:00 | Stop: 2022-12-19

## 2022-12-19 MED ADMIN — aspirin chewable tablet 81 mg: 81 mg | ORAL | @ 13:00:00

## 2022-12-19 MED ADMIN — lactated Ringers infusion: 100 mL/h | INTRAVENOUS | @ 06:00:00 | Stop: 2022-12-19

## 2022-12-19 MED ADMIN — insulin lispro (HumaLOG) injection 0-20 Units: 0-20 [IU] | SUBCUTANEOUS | @ 18:00:00

## 2022-12-19 MED ADMIN — acetaminophen (TYLENOL) tablet 650 mg: 650 mg | ORAL | @ 23:00:00

## 2022-12-19 MED ADMIN — lactated ringers bolus 1,000 mL: 1000 mL | INTRAVENOUS | @ 02:00:00 | Stop: 2022-12-18

## 2022-12-19 MED ADMIN — lamoTRIgine (LaMICtal) tablet 125 mg: 125 mg | ORAL | @ 06:00:00

## 2022-12-19 MED ADMIN — pantoprazole (Protonix) EC tablet 20 mg: 20 mg | ORAL | @ 06:00:00

## 2022-12-19 MED ADMIN — magnesium sulfate 2gm/50mL IVPB: 2 g | INTRAVENOUS | @ 17:00:00 | Stop: 2022-12-19

## 2022-12-19 MED ADMIN — gadoterate meglumine (DOTAREM) Soln 20 mL: 20 mL | INTRAVENOUS | @ 15:00:00 | Stop: 2022-12-19

## 2022-12-19 MED ADMIN — heparin (porcine) 5,000 unit/mL injection 5,000 Units: 5000 [IU] | SUBCUTANEOUS | @ 18:00:00

## 2022-12-19 MED ADMIN — DULoxetine (CYMBALTA) DR capsule 80 mg: 80 mg | ORAL | @ 13:00:00 | Stop: 2022-12-19

## 2022-12-19 MED ADMIN — heparin (porcine) 5,000 unit/mL injection 5,000 Units: 5000 [IU] | SUBCUTANEOUS | @ 06:00:00

## 2022-12-19 MED ADMIN — pantoprazole (Protonix) EC tablet 20 mg: 20 mg | ORAL | @ 13:00:00

## 2022-12-19 MED ADMIN — montelukast (SINGULAIR) tablet 10 mg: 10 mg | ORAL | @ 06:00:00

## 2022-12-19 MED ADMIN — lactated ringers bolus 1,000 mL: 1000 mL | INTRAVENOUS | @ 04:00:00 | Stop: 2022-12-18

## 2022-12-19 MED ADMIN — heparin (porcine) 5,000 unit/mL injection 5,000 Units: 5000 [IU] | SUBCUTANEOUS | @ 11:00:00

## 2022-12-19 MED ADMIN — magnesium sulfate 2gm/50mL IVPB: 2 g | INTRAVENOUS | @ 13:00:00 | Stop: 2022-12-19

## 2022-12-19 MED ADMIN — buPROPion (Wellbutrin SR) 12 hr tablet 300 mg: 300 mg | ORAL | @ 13:00:00

## 2022-12-19 MED ADMIN — pregabalin (LYRICA) capsule 150 mg: 150 mg | ORAL | @ 06:00:00 | Stop: 2022-12-19

## 2022-12-19 MED ADMIN — insulin lispro (HumaLOG) injection 0-20 Units: 0-20 [IU] | SUBCUTANEOUS | @ 23:00:00

## 2022-12-19 MED ADMIN — lamoTRIgine (LaMICtal) tablet 125 mg: 125 mg | ORAL | @ 13:00:00

## 2022-12-19 NOTE — Unmapped (Signed)
67 yo F with h/o OSA, asthma, HTN, HLD, CAD, fibromyalgia, depression, panic disorder, PTSD, p/w AMS, hallucinations, dizziness. History obtained from pt and daughter Vikki Ports, very difficult to obtain a timeline. Has had intermittent dizziness and falls for months. More recently, she has been talking in her sleep, hallucinating (daughter reports she saw someone standing over her bed, a woman outside her window with a flashlight). She denies recent changes to her medications (last addition was lamotrigine several months ago), though I reconciled her meds with her outpatient pharmacy who notes she was initiated on Rybelsus in December. Pt reports feeling not back to normal, still intermittently dizzy, though no longer hallucinating. She was hypoglycemic to 54 this morning, received juice. Suspect symptoms are toxic metabolic in the setting of multiple psychiatric medications, AKI and hypotension, recent initiation of Rybelsus, possibly intermittent hypoglycemia, tox screen +Cannabinoid, Amphetamine. TSH, vitamin B12 wnl.     Plan:  -Follow-up MRI brain (head CT unremarkable).   -Continue home doses of Wellbutrin, Lamotrigine, Cymbalta, doses adjusted based on her med rec.  -Holding home Lyrica, Tizanidine, Oxybutynin.   -Holding Glimepiride.   -BP stable off home amlodipine, losartan.   -PT/OT consults pending.     Shawn Route, MD  [facility]

## 2022-12-19 NOTE — Unmapped (Signed)
Pt has been resting at intervals overnight. Continues to c/o some disorientation and having hallucinations at times. Pt able to follow instructions w/o difficulty. Assisted up to [facility] x 2, incontinent of bladder also. Falls precautions in place with bed alarm set. Will cont to monitor.    6CIT Score for Cognitive Screening:  Complete for all new admissions, scoring found on the back of the Kardex  [] Normal (<8)    [] Significant (8 or higher)  [] Not yet completed this admission    CAM Delirium Screening Tool  Be sure to ask questions to gauge orientation and attention during your assessment  Feature Present? Scoring: CAM Positive if:   Acute onset (different from baseline) with Fluctuating course [x] Yes    [] No   Yes to BOTH of these AND.Marland KitchenMarland Kitchen   Inattention (days of the week or months of the year backward) [] Yes    [x] No    Disorganized thinking [x] Yes    [] No   Yes to at least   ONE of these   Altered level of consciousness  [] Yes    [x] No    CAM []  Positive  [x] Negative    CAM-S: Symptom Severity Score  Complete on all patients 65 years old, including in patients with dementia  Do not complete if the patient is known to be non-verbal due to their dementia  Feature  Severity Score  Question   ACUTE ONSET  & FLUCTUATING  COURSE [] No (0)    [x] Yes (1)    Is there evidence of an acute change in mental status from the patient's baseline? Did the patient's behavior fluctuate at any point during the interview for any of the 10 features?   2. INATTENTION [x] No (0)    [] Yes, mild (1)  [] Yes, marked (2)  Did the patient have difficulty focusing attention, for example being easily distractible, or having difficulty keeping track of what was being said?   3. DISORGANIZED  THINKING [] No (0)    [x] Yes, mild (1)  [] Yes, marked (2)  Was the patient's thinking disorganized or incoherent, such as rambling or irrelevant conversation, unclear or illogical flow or of ideas, unpredictable switching from subject to subject?   4. ALTERED LEVEL  OF  CONSCIOUSNESS [x] Normal (0)    [] Mild: vigilant or lethargic (1)  [] Marked: stupor or coma (2)    Overall, how would you rate the patient's level of consciousness?   -Alert (normal)   -Vigilant  -Lethargic  -Stupor  -Coma  -Uncertain   5. DISORIENTATION [] No (0)    [x] Yes, mild (1)  [] Yes, marked (2)  Was the patient disoriented at any time during the interview, such as thinking he/she was somewhere other than the hospital, using the wrong bed, or misjudging the time of day?   6. MEMORY  IMPAIRMENT [] No (0)    [x] Yes, mild (1)  [] Yes, marked (2)    Did the patient demonstrate any memory problems during the interview, such as inability to remember events in the hospital or difficulty remembering instructions?   7. PERCEPTUAL  DISTURBANCES [] No (0)    [x] Yes, mild (1)  [] Yes, marked (2)  Did the patient have any evidence of perceptual disturbances, for example, hallucinations, illusions, or misinterpretations (such as thinking something was moving when it was not)?   8. PSYCHOMOTOR  AGITATION [x] No (0)    [] Yes, mild (1)  [] Yes, marked (2)  At any time during the interview, did the patient have an unusually increased level of motor activity, such as restlessness, picking at  bedclothes, tapping fingers, or making frequent sudden changes of position?   9. PSYCHOMOTOR  RETARDATION [x] No (0)    [] Yes, mild (1)  [] Yes, marked (2)  At any time during the interview, did the patient have an unusually decreased level of motor activity, such as sluggishness, staring into space, staying in one position for a long time, or moving very slowly?   10. ALTERED  SLEEP-WAKE  CYCLE  [x] No (0)    [] Yes, mild (1)  [] Yes, marked (2)  Did the patient have evidence of disturbance of the sleep-wake cycle, such as excessive daytime sleepiness with insomnia at   night?   Long Form   SEVERITY SCORE:  Severity Score (add rows 1-10)  Total (0-19): 5    Scoring the CAM-S: Rate each symptom of delirium listed in the CAM instrument as absent (0), mild (1), marked (2). Acute onset or fluctuation is rated as absent (0) or present (1). Summarize these scores into a composite.     Problem: Adult Inpatient Plan of Care  Goal: Plan of Care Review  Outcome: Progressing  Goal: Patient-Specific Goal (Individualized)  Outcome: Progressing  Goal: Absence of Hospital-Acquired Illness or Injury  Outcome: Progressing  Intervention: Identify and Manage Fall Risk  Recent Flowsheet Documentation  Taken 12/19/2022 0100 by Renette Butters, RN ADN  Safety Interventions:   bed alarm   fall reduction program maintained   low bed  Intervention: Prevent Skin Injury  Recent Flowsheet Documentation  Taken 12/19/2022 0100 by Renette Butters, RN ADN  Positioning for Skin: Supine/Back  Goal: Optimal Comfort and Wellbeing  Outcome: Progressing  Goal: Readiness for Transition of Care  Outcome: Progressing  Goal: Rounds/Family Conference  Outcome: Progressing     Problem: Fall Injury Risk  Goal: Absence of Fall and Fall-Related Injury  Outcome: Progressing  Intervention: Promote Injury-Free Environment  Recent Flowsheet Documentation  Taken 12/19/2022 0100 by Renette Butters, RN ADN  Safety Interventions:   bed alarm   fall reduction program maintained   low bed     Problem: Self-Care Deficit  Goal: Improved Ability to Complete Activities of Daily Living  Outcome: Progressing

## 2022-12-19 NOTE — Unmapped (Signed)
I received signout from previous ED physician.     ED I-PASS Handoff  Illness Severit: watcher  Patient Summary: Marie Quinn is a 67 y.o. female with a past medical history of CHF, COPD, myocardial infarction, asthma, hypertension, hyperlipidemia, substance abuse, fibromyalgia, depression, panic disorder who presents with provide status.  Signout patient has been patient for admission, no signed.  Multiple days of confusion with possible fall, called police today to report people who were not actually there.  Workup notable for urinary tract infection, will order ceftriaxone.  Notable for AKI.  Action List:   Team assignment  Situation Awareness Horticulturist, commercial): Anticipate admit  Synthesis by Receiver    ED Course as of 12/19/22 0010   Wed Dec 18, 2022   2316 Received signout from previous resident at 2300.  Patient has been paged for admission, no team assigned.  Patient presents with altered mental status.  Workup shows evidence of urinary tract infection.  Patient has been ordered ceftriaxone, fluid bolus.  Otherwise workup has been notable for amphetamine positive, cannabinoid positive.  AKI with a serum creatinine of 2.46.  Mild elevation of WBC at 12.5.  VBG within normal limits.   Thu Dec 19, 2022   0010 Admit orders placed.        Final diagnoses:   Altered mental status, unspecified altered mental status type (Primary)   AKI (acute kidney injury) (CMS-HCC)       Vitals:    12/18/22 2300 12/18/22 2315 12/18/22 2330 12/18/22 2345   BP: 112/65 115/65 116/72 126/64   Pulse: 72 65 66 67   Resp: 23 19 16 18    Temp:       TempSrc:       SpO2: 96% 98% 97% 96%       XR Chest 1 view Portable   Final Result   Low lung volumes with a prominence of the interstitium which could be technical in nature or related to viral infection.         CT head WO contrast   Final Result   No evidence of acute intracranial pathology.             Lab Results   Component Value Date    WBC 12.5 (H) 12/18/2022    HGB 11.3 12/18/2022    HCT 35.2 12/18/2022    PLT 219 12/18/2022       Lab Results   Component Value Date    NA 142 12/18/2022    K 3.5 12/18/2022    CL 107 12/18/2022    CO2 29.2 12/18/2022    BUN 30 (H) 12/18/2022    CREATININE 2.46 (H) 12/18/2022    GLU 105 12/18/2022    CALCIUM 8.7 12/18/2022    MG 1.8 09/17/2022    PHOS 3.3 06/28/2019       Lab Results   Component Value Date    BILITOT 0.3 12/18/2022    PROT 6.2 12/18/2022    ALBUMIN 2.8 (L) 12/18/2022    ALT 14 12/18/2022    AST 15 12/18/2022    ALKPHOS 81 12/18/2022    GGT 34 02/05/2013       Lab Results   Component Value Date    INR 1.19 03/22/2020    APTT 26.8 02/01/2011

## 2022-12-19 NOTE — Unmapped (Signed)
OCCUPATIONAL THERAPY  Evaluation (12/19/22 1319)    Patient Name:  Marie Quinn       Medical Record Number: [id number]   Date of Birth: 1955-10-31  Sex: Female            OT Treatment Diagnosis:  eval for eval and treat, fall ,dizziness    Assessment  Problem List: Decreased safety awareness, Decreased strength, Impaired balance, Decreased endurance, Decreased activity tolerance, Pain, Shortness of breath, Fall risk, Impaired ADLs  Personal Factors/Comorbidities (Occupational Profile and History Review): Expanded (Moderate)  Assessment of Occupational Performance : Balance, Endurance, Mobility, Sensation, Strength, Cognitive skills  Clinical Decision Making: Moderate Complexity    Assessment: Pt is a 67 yo F with h/o OSA, asthma, HTN, HLD, CAD, fibromyalgia, depression, panic disorder, PTSD, p/w AMS, hallucinations, dizziness. Pt A&O x4 at time of OT eval. Pt reports at baseline she is grossly independent with ADLs and ADL transfers without AD. Pt lives alone, though mentions her daugher is involved at at the home as well. Ptreports several recetn falls, reporting she falls all the time. Pt reports she often does not wear her home O2 and will forget to take her medications. Therapist educated pt on role of OT, OT POC, and safety during functional transfers. Pt is currently functioning below baseline, requiring CGA for functional transfers and functional mobility without AD, CGA for standing grooming tasks, and supervision for full body dressing tasks due to demonstrated deficits including decreased endurance, decreased strength, decreased balance, and decreased safety awareness decreasing independence. Skilled acute care OT recommended to address these deficits and maximize independence. Recommend 3x at post acute DC, pt would benefit from additional supervision for IADLs for safety.     After review of the patient's occupational profile and history, assessment of occupational performance, clinical decision making, and development of POC, the patient presents as a moderate complexity case. Based on the daily activity AM-PAC raw score of 19/24, the patient is considered to be 65% impaired with self care.    Today's Interventions: ADL retraining, Balance activities, Compensatory tech. training, Conservation, Education - Patient, Endurance activities, Functional cognition, Functional mobility, Safety education, Range of motion, Positioning, Transfer training  Today's Interventions: endurance training, strength training, balance training, functional transfers, functional mobility, pt education, standing groomign tasks, LE dressing    Activity Tolerance During Today's Session  Tolerated treatment well, Limited by fatigue    Plan  Planned Frequency of Treatment:  1-2x per day for: 3-4x week       Planned Interventions:  Education (Patient/Family/Caregiver), Self-Care/Home Training, Therapeutic Exercise, Therapeutic Activity      Post-Discharge Occupational Therapy Recommendations:   3x weekly, Would benefit from assistance with IADLs/ADLs     OT DME Recommendations: Bedside commode -        GOALS:   Patient and Family Goals: none stated    Short Term:  SHORT GOAL #1: Pt will complete toilet transfer and toileting tasks with SBA using LRD   Time Frame : 1 week  SHORT GOAL #2: Pt will complete full body ADL with SBA using LRD   Time Frame : 1 week  SHORT GOAL #3: Pt will complete standing grooming task x5 mins using LRD   Time Frame : 1 week  SHORT GOAL #4: Pt will verbalize 3 fall prevention strategies independently to maximize safety and independence upon home DC   Time Frame : 1 week            Prognosis:  Good  Positive Indicators:  Barriers to Discharge: Decreased caregiver support, Decreased safety awareness, Endurance deficits    Subjective  Medical Updates Since Last Visit:    Prior Functional Status pt reports prior to admission she was grossly independent with ADLs and ADL transfers without AD, however reports several falls I fall all the time.            Patient / Caregiver reports: I think I can walk without anything to the bathroom Pt agreeable to OT session    Past Medical History:   Diagnosis Date    Achilles tendinitis, right leg 2021    Anxiety     Arthritis     Adult life    Asthma     BPPV (benign paroxysmal positional vertigo)     Chronic diastolic CHF (congestive heart failure) (CMS-HCC)     COPD (chronic obstructive pulmonary disease) (CMS-HCC)     Depression     Fibromyalgia     GERD (gastroesophageal reflux disease)     Adult life    Heart disease     Heart murmur     Hyperlipidemia     Hypertension     Myocardial infarction (CMS-HCC) 2008    s/p stent placement x 1 at [facility]    Obesity     Panic disorder     Substance abuse (CMS-HCC)     Type 2 diabetes mellitus, without long-term current use of insulin (CMS-HCC) 12/19/2022    Urge incontinence     Social History     Tobacco Use    Smoking status: Every Day     Current packs/day: 0.00     Types: Cigarettes     Start date: 05/01/1994     Last attempt to quit: 05/02/2019     Years since quitting: 3.6    Smokeless tobacco: Never    Tobacco comments:     Pt smokes  1.5cpd. Pt expressed desire to quit    Substance Use Topics    Alcohol use: Yes      Past Surgical History:   Procedure Laterality Date    cardiac stent x 1      CHOLECYSTECTOMY      HYSTERECTOMY  1990    one ovary remains    PR COLORECTAL SCRN; HI RISK IND N/A 08/06/2019    Procedure: COLOREC CANCR SCR; COLNSCPY;  Surgeon: Charm Rings, MD;  [facility];  Service: Gastroenterology    PR ERCP REMOVE FOREIGN BODY/STENT BILIARY/PANC DUCT N/A 08/09/2019    Procedure: ENDOSCOPIC RETROGRADE CHOLANGIOPANCREATOGRAPHY (ERCP); W/ REMOVAL OF FOREIGN BODY/STENT FROM BILIARY/PANCREATIC DUCT(S);  Surgeon: Vonda Antigua, MD;  Location: [facility];  Service: Gastroenterology    PR ERCP STENT PLACEMENT BILIARY/PANCREATIC DUCT N/A 06/28/2019 Procedure: ENDOSCOPIC RETROGRADE CHOLANGIOPANCREATOGRAPHY (ERCP); WITH PLACEMENT OF ENDOSCOPIC STENT INTO BILIARY OR PANCREATIC DUCT;  Surgeon: Vonda Antigua, MD;  Location: [facility];  Service: Gastroenterology    PR ERCP,SPHINCTEROTOMY N/A 11/12/2018    Procedure: ERCP; W/SPHINCTEROTOMY/PAPILLOTOMY;  Surgeon: Vonda Antigua, MD;  Location: [facility];  Service: Gastroenterology    PR ERCP,W/REMOVAL STONE,BIL/PANCR DUCTS N/A 11/12/2018    Procedure: ERCP; W/ENDOSCOPIC RETROGRADE REMOVAL OF CALCULUS/CALCULI FROM BILIARY &/OR PANCREATIC DUCTS;  Surgeon: Vonda Antigua, MD;  Location: [facility];  Service: Gastroenterology    TONSILLECTOMY      TUBAL LIGATION  1990    Tubal pregnacy    Family History   Problem Relation Age of Onset  Heart disease Father         Deacesed    Coronary artery disease Father         quadruple bypass    Mental illness Father     Asthma Father     COPD Father     Depression Father     Hypertension Father     Cancer Sister     Arthritis Daughter     Depression Daughter     Asthma Sister     Cancer Sister         Colan cancer deceased    COPD Sister     Depression Sister     Early death Sister         35 yrs old    Cancer Paternal Aunt         Thyroid cancer    Cancer Maternal Grandmother         Brain cancer    Depression Mother     Diabetes Mother     Hypertension Mother     Diabetes Brother     Vision loss Maternal Aunt         Blind at birth    Substance Abuse Disorder Neg Hx     Alcohol abuse Neg Hx     Drug abuse Neg Hx         Iodine and iodide containing products, Statins-hmg-coa reductase inhibitors, Sulfa (sulfonamide antibiotics), and Oxycodone     Objective Findings  Precautions / Restrictions  Falls precautions       Weight Bearing  Non-applicable    Required Braces or Orthoses  Non-applicable    Communication Preference  Verbal    Pain  pt reported pain in low back and IV site, did not rate. therapist repositioned for comfort    Equipment / Environment  Vascular access (PIV, TLC, Port-a-cath, PICC), Supplemental oxygen    Living Situation  Living Environment: Trailer/Mobile home  Lives With: Daughter, Alone (daughter in-and-out)  Home Living: One level home, Standard height toilet, Tub/shower unit  Caregiver Identified?: (P) Yes  Caregiver Availability: (P) Intermittent  Caregiver Ability: (P) Limited lifting  Equipment available at home: Straight cane     Cognition   Orientation Level:  Oriented x 4   Arousal/Alertness:  Appropriate responses to stimuli   Attention Span:  Attends with cues to redirect   Memory:  Appears intact   Following Commands:  Follows all commands and directions without difficulty   Safety Judgment:  Decreased awareness of need for safety   Awareness of Errors and Problem Solving:  Assistance required to identify errors made   Comments: pt laquacious throughout session, requiring min cues for re-direction to task. mild safety awareness concerns, reports she doesn't use her [place] O2 at home and is limited with medication compliance    Vision / Hearing   Vision: No acute deficits identified     Hearing: No deficit identified         Hand Function:  Right Hand Function: Right hand grip strength, ROM and coordination WNL  Left Hand Function: Left hand grip strength, ROM and coordination WNL  Hand Dominance: Right    Skin Inspection:  Skin Inspection: Intact where visualized    ROM / Strength:  UE ROM/Strength: Left Impaired/Limited, Right Impaired/Limited  RUE Impairment: Reduced strength  LUE Impairment: Reduced strength  LE ROM/Strength: Left Impaired/Limited, Right Impaired/Limited  RLE Impairment: Reduced strength  LLE Impairment: Reduced strength    Coordination:  Coordination: [facility]  Sensation:  RUE Sensation: RUE intact  LUE Sensation: LUE intact  RLE Sensation: RLE impaired  RLE Sensation Impairment: chronic peripheral neuropathy  LLE Sensation: LLE impaired  LLE Sensation Impairment: chronic peripheral neuropathy    Balance:  Static Sitting-Level of Assistance: Supervision  Dynamic Sitting-Level of Assistance: Supervision    Static Standing-Level of Assistance: Contact guard  Dynamic Standing - Level of Assistance: Contact guard    Functional Mobility  Transfers: Contact Guard assist  Bed Mobility - Needs Assistance: Supervision  Ambulation: functional mobility x10 feet x2 trials without AD, pt noted to furniture surf, and CGA ,no LOB noted    ADLs  ADLs: Supervision, Needs assistance with ADLs  ADLs - Needs Assistance: Grooming, Toileting, UB dressing, LB dressing  Grooming - Needs Assistance: Contact Guard assist, Performed standing  Toileting - Needs Assistance: Contact Guard assist  UB Dressing - Needs Assistance: Set Up Assist, Performed seated, Verbal assist/cues required  LB Dressing - Needs Assistance: Supervision (doffing/donning B socks)  AM-PAC-Daily Activity  Lower Body Dressing assistance needs: A Little - Minimal/Contact Guard Assist/Supervision  Bathing assistance needs: A Little - Minimal/Contact Guard Assist/Supervision  Toileting assistance needs: A Little - Minimal/Contact Guard Assist/Supervision  Upper Body Dressing assistance needs: A Little - Minimal/Contact Guard Assist/Supervision  Personal Grooming assistance needs: A Little - Minimal/Contact Guard Assist/Supervision  Eating Meals assistance needs: None - Modified Independent/Independent    Daily Activity Score:  Daily Activity Score: 19    Score (in points): % of Functional Impairment, Limitation, Restriction  6: 100% impaired, limited, restricted  7-8: At least 80%, but less than 100% impaired, limited restricted  9-13: At least 60%, but less than 80% impaired, limited restricted  14-19: At least 40%, but less than 60% impaired, limited restricted  20-22: At least 20%, but less than 40% impaired, limited restricted  23: At least 1%, but less than 20% impaired, limited restricted  24: 0% impaired, limited restricted    Vitals / Orthostatics  Vitals/Orthostatics: NAD         Patient at end of session: All needs in reach, In chair, Lines intact, Staff present Nutritional therapist)     Occupational Therapy Session Duration  OT Individual [mins]: 33         I attest that I have reviewed the above information.  Signed: Girard Cooter, OT  Filed 12/19/2022

## 2022-12-19 NOTE — Unmapped (Signed)
[facility]  Emergency Department Provider Note    ED Clinical Impression     Final diagnoses:   Altered mental status, unspecified altered mental status type (Primary)   AKI (acute kidney injury) (CMS-HCC)       HPI, ED Course, Assessment and Plan     Initial Clinical Impression:    December 18, 2022 6:04 PM   Marie Quinn is a 67 y.o. female with past medical history of CHF, COPD, MI, asthma, HTN, HLD, substance abuse, fibromyalgia, BPPV, depression, and panic disorder presenting with altered mental status. The patient reports via [facility] from home for AMS since yesterday. Per EMS, family on scene reported that the patient was talking out of her head, confused since yesterday, with associated generalized weakness, and that she has been falling recently. Family denied any urinary symptoms. Daughter reported a patient history of DM, but stated that patient is not compliant with diabetic care. EMS checked BGL on arrival, which was 74 and increased to 105 after oral glucose. BP was also 89/50 on EMS arrival and increased to 126/65 after 500 ml IV fluid. Her spO2 was 94% on RA and increased to 97% on 3L [place]. HR was 68 BPM. EMS reports that the patient has been persistently altered but conversant en route to the ED, and notes that patient's skin turgor was positive for dehydration.     Per chart, the patient visited this ED approximately 1 week ago on 12/12/22 for low back pain, saddle anesthesia, and bowel incontinence c/f spinal cord compression. She was transferred to [facility] ED. There, MRI [was] without evidence of fracture or cord compression. 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 Patient was directed to follow up with PCP about these findings.     Today of note, she is able to move her lower extremities without difficulty and although she is altered denies any numbness or episodes of incontinence    BP 110/97  - Pulse 62  - Temp 36.9 ??C (98.5 ??F) (Oral)  - Resp 15  - SpO2 98%     Medical Decision Making    Further ED updates and updates to plan as per ED Course below:    ED Course:  ED Course as of 12/18/22 2246   Wed Dec 18, 2022   6659 66 year old female with past medical history as below here via EMS for altered mental status since yesterday.    On exam, she is alert, able to answer most questions, but does say things that do not make sense in the context of the conversation.  She is moving all extremities and has a nonfocal neuroexam.    Broad differential at this point including infection, intoxication, electrolyte normality, intracranial bleed.  Labs, chest x-ray, EKG, CT head to evaluate.  Will call family to obtain collateral.   1950 WBC(!): 12.5  Mild leukocytosis   1950 HGB: 11.3  No anemia   1950 CT head WO contrast  IMPRESSION:  No evidence of acute intracranial pathology.     1950 XR Chest 1 view Portable  IMPRESSION:  No evidence of acute intracranial pathology.     2026 Creatinine(!): 2.46  Significant AKI, will give fluid bolus    2036 Called Suzanne Rice for collateral information-patient has been confused over the past few days with some mild falls, yesterday, she called the police because she reported that somebody was standing over her in her house, but no one was  there.  Today, she was on the phone with the insurance company and they are concerned about her speech so they called 911.   2037 Spoke with nursing, patient has tried to void but has been unable to do so.  They will get a bladder scan and then get an In-N-Out cath for UA.   2056 17mL on bladder scan, ordering second liter bolus    2141 BP: 62/53  Systolic 120s when I just walked in the room      2143 Patient has only received about three quarters of a liter, and had stopped running while her arm was bent, I straightened it out and it is now flowing freely.   2231 Nursing is cathing for the UA now    2244 UA has been sent.    Patient's mental status is the same as when she arrived, sleeping, but easily arousable, will talk and answer questions, but not making much sense.  We are still pending her urine results, but regardless, given her change from baseline, she will require admission especially given that she has an AKI and will require further rehydration..  Paging MAO.         External Records Reviewed: I have reviewed recent and relevant previous record, including: External ED note - 12/12/2022 [facility] ED Note (details patient's recent ED evaluation and negative workup for spinal cord injury)    Social Determinants of Health with Concerns     Internet Connectivity: Not on file   Tobacco Use: Medium Risk (12/12/2022)    Received from [facility]    Patient History     Smoking Tobacco Use: Former     Smokeless Tobacco Use: Never     Passive Exposure: Not on file   Housing/Utilities: Unknown (03/24/2021)    Housing/Utilities     Within the past 12 months, have you ever stayed: outside, in a car, in a tent, in an overnight shelter, or temporarily in someone else's home (i.e. couch-surfing)?: No     Are you worried about losing your housing?: Not on file     Within the past 12 months, have you been unable to get utilities (heat, electricity) when it was really needed?: Not on file   Alcohol Use: Not on file   Substance Use: Not on file   Physical Activity: Inactive (05/06/2019)    Exercise Vital Sign     Days of Exercise per Week: 0 days     Minutes of Exercise per Session: 0 min   Interpersonal Safety: Not on file   Stress: Stress Concern Present (05/06/2019)    [employer] of Occupational Health - Occupational Stress Questionnaire     Feeling of Stress : Very much     _____________________________________________________________________    The case was discussed with the attending physician who is in agreement with the above assessment and plan    Past History     PAST MEDICAL HISTORY/PAST SURGICAL HISTORY:   Past Medical History:   Diagnosis Date    Achilles tendinitis, right leg 2021    Anxiety     Arthritis     Adult life    Asthma     BPPV (benign paroxysmal positional vertigo)     Chronic diastolic CHF (congestive heart failure) (CMS-HCC)     COPD (chronic obstructive pulmonary disease) (CMS-HCC)     Depression     Fibromyalgia     GERD (gastroesophageal reflux disease)     Adult life    Heart disease  Heart murmur     Hyperlipidemia     Hypertension     Myocardial infarction (CMS-HCC) 2008    s/p stent placement x 1 at [facility]    Obesity     Panic disorder     Substance abuse (CMS-HCC)     Urge incontinence        Past Surgical History:   Procedure Laterality Date    cardiac stent x 1      CHOLECYSTECTOMY      HYSTERECTOMY  1990    one ovary remains    PR COLORECTAL SCRN; HI RISK IND N/A 08/06/2019    Procedure: COLOREC CANCR SCR; COLNSCPY;  Surgeon: Charm Rings, MD;  [facility];  Service: Gastroenterology    PR ERCP REMOVE FOREIGN BODY/STENT BILIARY/PANC DUCT N/A 08/09/2019    Procedure: ENDOSCOPIC RETROGRADE CHOLANGIOPANCREATOGRAPHY (ERCP); W/ REMOVAL OF FOREIGN BODY/STENT FROM BILIARY/PANCREATIC DUCT(S);  Surgeon: Vonda Antigua, MD;  Location: [facility];  Service: Gastroenterology    PR ERCP STENT PLACEMENT BILIARY/PANCREATIC DUCT N/A 06/28/2019    Procedure: ENDOSCOPIC RETROGRADE CHOLANGIOPANCREATOGRAPHY (ERCP); WITH PLACEMENT OF ENDOSCOPIC STENT INTO BILIARY OR PANCREATIC DUCT;  Surgeon: Vonda Antigua, MD;  Location: [facility];  Service: Gastroenterology    PR ERCP,SPHINCTEROTOMY N/A 11/12/2018    Procedure: ERCP; W/SPHINCTEROTOMY/PAPILLOTOMY;  Surgeon: Vonda Antigua, MD;  Location: [facility];  Service: Gastroenterology    PR ERCP,W/REMOVAL STONE,BIL/PANCR DUCTS N/A 11/12/2018    Procedure: ERCP; W/ENDOSCOPIC RETROGRADE REMOVAL OF CALCULUS/CALCULI FROM BILIARY &/OR PANCREATIC DUCTS;  Surgeon: Vonda Antigua, MD;  Location: [facility];  Service: Gastroenterology    TONSILLECTOMY      TUBAL LIGATION  1990    Tubal pregnacy       MEDICATIONS:     Current Facility-Administered Medications:     lactated ringers bolus 1,000 mL, 1,000 mL, Intravenous, Once, Raechel Ache, MD, 1,000 mL at 12/18/22 2233    Current Outpatient Medications:     albuterol 2.5 mg /3 mL (0.083 %) nebulizer solution, Inhale the contents of 1 vial (2.5 mg total) by nebulization every four (4) hours as needed for wheezing., Disp: 150 mL, Rfl: 1    albuterol HFA 90 mcg/actuation inhaler, Inhale 2 puffs every four (4) hours as needed for wheezing., Disp: 18 g, Rfl: 1    amLODIPine (NORVASC) 10 MG tablet, Take 1 tablet (10 mg total) by mouth daily., Disp: 90 tablet, Rfl: 2    aspirin (ECOTRIN) 81 MG tablet, Take 81 mg by mouth daily., Disp: , Rfl:     buPROPion (WELLBUTRIN SR) 150 MG 12 hr tablet, Take 1 tablet (150 mg total) by mouth Two (2) times a day. (Patient not taking: Reported on 10/07/2021), Disp: 60 tablet, Rfl: 3    citalopram (CELEXA) 40 MG tablet, TAKE (1) TABLET BY MOUTH EVERY DAY (Patient not taking: Reported on 10/07/2021), Disp: 90 tablet, Rfl: 3    empty container (SHARPS CONTAINER) Misc, Use as directed., Disp: 1 each, Rfl: 3    empty container Misc, Use as directed to dispose of injectable medications, Disp: 1 each, Rfl: 3    evolocumab (REPATHA SYRINGE) 140 mg/mL Syrg, Inject the contents of 1 syringe (140 mg) under the skin every fourteen (14) days. (Patient not taking: Reported on 10/07/2021), Disp: 6 mL, Rfl: 3    evolocumab (REPATHA SYRINGE) 140 mg/mL Syrg, Inject the contents of 1 syringe (140 mg) under the skin every fourteen (14) days., Disp:  6 mL, Rfl: 3    gabapentin (NEURONTIN) 100 MG capsule, , Disp: , Rfl:     glimepiride (AMARYL) 4 MG tablet, Take 4 mg by mouth daily before breakfast., Disp: , Rfl:     hydrOXYzine (ATARAX) 25 MG tablet, Take 1 Tablet by mouth every 6 hours as needed for anxiety and congestion, Disp: 30 tablet, Rfl: 0    losartan (COZAAR) 100 MG tablet, Take 1 tablet (100 mg total) by mouth daily., Disp: 90 tablet, Rfl: 1    meclizine (ANTIVERT) 25 mg tablet, Take 1 tablet (25 mg total) by mouth Three (3) times a day as needed for dizziness. (Patient not taking: Reported on 10/07/2021), Disp: 30 tablet, Rfl: 0    meloxicam (MOBIC) 15 MG tablet, , Disp: , Rfl:     methylPREDNISolone (MEDROL DOSEPACK) 4 mg tablet, , Disp: , Rfl:     metoprolol succinate (TOPROL-XL) 100 MG 24 hr tablet, Take 1 tablet (100 mg total) by mouth two (2) times a day. Repeat for elevated BP., Disp: 180 tablet, Rfl: 3    omeprazole (PRILOSEC) 20 MG capsule, Take 1 capsule (20 mg total) by mouth two (2) times a day., Disp: 180 capsule, Rfl: 1    ondansetron (ZOFRAN) 4 MG tablet, TAKE ONE TABLET BY MOUTH EVERY 8 HOURS AS NEEDED FOR NAUSEA AND VOMITING FOR UP TO 5 DAYS, Disp: , Rfl:     ondansetron (ZOFRAN-ODT) 8 MG disintegrating tablet, , Disp: , Rfl:     peg-electrolyte soln (GOLYTELY) 420 gram SolR, Take as directed in the instructions sent to you via your [facility] (Patient not taking: Reported on 03/29/2020), Disp: 2 Bottle, Rfl: 0    predniSONE (DELTASONE) 10 mg tablet pack, prednisone 10 mg tablets in a dose pack  Take 1 dose pk by oral route., Disp: , Rfl:     pregabalin (LYRICA) 25 MG capsule, Take 25 mg by mouth Two (2) times a day., Disp: , Rfl:     promethazine-codeine (PHENERGAN WITH CODEINE) 6.25-10 mg/5 mL syrup, 1-2 tsp po every 6 hours as needed for cough and congestion., Disp: 120 mL, Rfl: 0    traMADoL (ULTRAM) 50 mg tablet, , Disp: , Rfl:     traMADoL (ULTRAM) 50 mg tablet, tramadol 50 mg tablet  Take 1 tablet every 6-8 hours by oral route., Disp: , Rfl:     ALLERGIES:   Iodine and iodide containing products, Statins-hmg-coa reductase inhibitors, Sulfa (sulfonamide antibiotics), and Oxycodone    SOCIAL HISTORY:   Social History     Tobacco Use    Smoking status: Every Day     Current packs/day: 0.00     Types: Cigarettes     Start date: 05/01/1994     Last attempt to quit: 05/02/2019     Years since quitting: 3.6    Smokeless tobacco: Never    Tobacco comments:     Pt smokes  1.5cpd. Pt expressed desire to quit    Substance Use Topics    Alcohol use: Yes       FAMILY HISTORY:  Family History   Problem Relation Age of Onset    Heart disease Father         Deacesed    Coronary artery disease Father         quadruple bypass    Mental illness Father     Asthma Father     COPD Father     Depression Father     Hypertension Father  Cancer Sister     Arthritis Daughter     Depression Daughter     Asthma Sister     Cancer Sister         Colan cancer deceased    COPD Sister     Depression Sister     Early death Sister         96 yrs old    Cancer Paternal Aunt         Thyroid cancer    Cancer Maternal Grandmother         Brain cancer    Depression Mother     Diabetes Mother     Hypertension Mother     Diabetes Brother     Vision loss Maternal Aunt         Blind at birth    Substance Abuse Disorder Neg Hx     Alcohol abuse Neg Hx     Drug abuse Neg Hx           Review of Systems     A review of systems was performed and relevant portions were as noted above in HPI     Physical Exam     VITAL SIGNS:    BP 110/97  - Pulse 62  - Temp 36.9 ??C (98.5 ??F) (Oral)  - Resp 15  - SpO2 98%         Constitutional:   Alert, able to answer most questions, but does say things that do not make sense in the context of the conversation.  Head:   Normocephalic and atraumatic  Eyes:   Conjunctivae are normal, EOMI, PERRL  ENT:   No notable congestion, Mucous membranes moist, External ears normal, no notable stridor  Cardiovascular:   Rate as vitals above. Appears warm and well perfused  Respiratory:   Normal respiratory effort. Breath sounds are normal.  Gastrointestinal:   Soft, non-distended, and nontender.   Genitourinary:   Deferred  Musculoskeletal:    Normal range of motion in all extremities. No tenderness or edema noted in B/L lower extremities  Neurologic:   She is moving all extremities and has a nonfocal neuroexam. No gross focal neurologic deficits beyond baseline are appreciated.  Skin:   Skin is warm, dry and intact.     Radiology     XR Chest 1 view Portable   Final Result   Low lung volumes with a prominence of the interstitium which could be technical in nature or related to viral infection.         CT head WO contrast   Final Result   No evidence of acute intracranial pathology.             Labs     Labs Reviewed   COMPREHENSIVE METABOLIC PANEL - Abnormal; Notable for the following components:       Result Value    BUN 30 (*)     Creatinine 2.46 (*)     eGFR CKD-EPI (2021) Female 21 (*)     Albumin 2.8 (*)     All other components within normal limits   BLOOD GAS, VENOUS - Abnormal; Notable for the following components:    pH, Venous 7.30 (*)     pO2, Ven 42 (*)     All other components within normal limits   CBC W/ AUTO DIFF - Abnormal; Notable for the following components:    WBC 12.5 (*)     RDW 17.1 (*)     Absolute  Neutrophils 9.1 (*)     Anisocytosis Slight (*)     All other components within normal limits   INFLUENZA/RSV/COVID PCR - Normal    Narrative:     This test was performed using the Cepheid Xpert Xpress SARS-CoV-2/Flu/RSV plus assay, which has been validated by the CLIA-certified, CAP-inspected [facility]. FDA has granted Emergency Use Authorization for this test. Negative results do not preclude infection and should be interpreted along with clinical observations, patient history, and epidemiological information. Information for providers and patients can be found here: [email or web address]   ACETAMINOPHEN LEVEL - Normal   SALICYLATE LEVEL - Normal   ETHANOL - Normal    Narrative:     Testing for medical purposes only.   CBC W/ DIFFERENTIAL    Narrative:     The following orders were created for panel order CBC w/ Differential.                  Procedure Abnormality         Status                                     ---------                               -----------         ------                                     CBC w/ Differential[[id number]]         Abnormal            Final result                                                 Please view results for these tests on the individual orders.   TOXICOLOGY SCREEN, URINE   URINALYSIS WITH MICROSCOPY WITH CULTURE REFLEX         Pertinent labs & imaging results that were available during my care of the patient were reviewed by me and considered in my medical decision making (see chart for details).    Please note- This chart has been created using [employer]. Chart creation errors have been sought, but may not always be located and such creation errors, especially pronoun confusion, do NOT reflect on the standard of medical care.    Documentation assistance was provided by Lance Morin, Scribe, on December 18, 2022 at 6:24 PM for Elyse Hsu, MD.  Documentation assistance was provided by the scribe in my presence.  The documentation recorded by the scribe has been reviewed by me and accurately reflects the services I personally performed.        Note has been documented by Victorino Dike L. Standish on 12/18/2022     Raechel Ache, MD  Resident  12/18/22 [phone]

## 2022-12-19 NOTE — Unmapped (Signed)
[facility]   History and Physical       Assessment and Plan     Marie Quinn is a 67 y.o. female who is presenting to [facility] with Disorientation, in the setting of the following pertinent/contributing co-morbidities: anxiety, depression, PTSD .      Disorientation- altered mental status with hallucinations - dizziness: Patient with description of feeling confused, having hallucinations. Also describes feeling intermittently dizzy. Differential is quite wide at this time. Mild leukocytosis may be concerning for infection, exam nonfocal, UA perhaps concerning for UTI, possible this is contributing. Patient is on quite a number of centrally acting medications, including bupropion, duloxetine, hydroxyzine, lamotrigine, lyrica, tizanidine. Will hold tizanidine and hydroxyzine for now, likely polypharmacy is also contributing to altered mental status. Dizziness is likely secondary to hypotension (as patient was hypotensive on arrival to ED and is on multiple antihypertensives), also possible there is a central process at play (no focal neuro deficits and no concern for acute process at this time). Also likely that cannabinoid use is not helping.  - will order MRI brain to evaluate for stroke  - TSH, B12, folate levels  - re-evaluate in AM  - telesitter for now given hallucinations  - recommend psychiatry consult if workup is negative  - PT/OT        Secondary/Additional Active Problems:  AKI -? Acute cystitis: creatinine 2.46 on admission from baseline ~0.8-0.9. Likely secondary to dehydration from decreased PO intake and antihypertensives. UA not super convincing for UTI, however given altered mental status and leukocytosis will treat as such. S/p 2L IVF in ED.   - cont LR at 100cc/hr overnight  - ceftriaxone x3 days total  - recheck creatinine in AM  - hold all antihypertensives for now  - avoid nephrotoxic agents as able    Acute hypoxic respiratory failure - OSA: patient currently on 4L [place], not on oxygen at baseline. Does have OSA. VBG with metabolic acidosis, no respiratory acidosis at this time which is reassuring. CXR with low lung volumes. No clear reason patient has an oxygen requirement at this time, would trial off oxygen.   - incentive spirometry as able  - wean oxygen as able, consider walk test  - CPAP nightly    T2DM: last hemoglobin A1C 09/2022 6.5. Patient came in slightly hypoglycemic, wonder if she is being overtreated at this point.  - recheck hemoglobin A1C  - hold home glimepiride and rybelsus  - SSI, can discontinue if patient is not requiring    Neuropathy: Do have some concern that patient's Lyrica may be contributing to polypharmacy as above.   - cont home lyrica 150mg  BID for now    Anxiety/depression/panic disorder/PTSD: Wonder if perhaps polypharmacy or side effect from multiple medications is contributing to mental status as above.   - would recommend psychiatry consult in AM  - cont home bupropion 300mg  daily  - cont home duloxetine 80mg  daily  - hold home hydroxyzine for now, can restart if patient is becoming anxious as this would be preferable to benzos  - cont home lamotrigine 125mg  BID    HTN: hypotensive in ED, improved with IVF. Home antihypertensives include amlodipine 10mg , metoprolol succinate 100mg , losartan 100mg   - hold home antihypertensives for now     HLD - CAD: on home repatha, will not do while in hospital  - cont asa 81mg  daily    Asthma: cont home singulair, prn albuterol nebs        Prophylaxis  -subcutaneous  heparin    Diet  -regular diet    Code Status / HCDM  -Full Code,   -  HCDM (patient stated preference): Caton,Valarie - Daughter - [phone]    Significant Comorbid Conditions:         Issues Impacting Complexity of Management:      Medical Decision Making: Reviewed records from the following unique sources  previous PCP visits, ED provider notes, discussion with ED provider.    I personally spent greater than 75 minutes face-to-face and non-face-to-face in the care of this patient, which includes all pre, intra, and post visit time on the date of service.  All documented time was specific to the E/M visit and does not include any procedures that may have been performed.    HPI      Marie Quinn is a 67 y.o. female PMHx OSA, asthma, HTN, HLD, CAD, fibromyalgia, depression, panic disorder who is presenting to [facility] with Disorientation. Patient states that for the past week she has been feeling more confused. She states that her sentences in a paragraph come from other sentences. She also states that she has been hallucinating. She notes that she has been hallucinating when her eyes are closed for a while now, but for example she believed that she baked a cake with her granddaughter when she did not participate. Patient also endorses falls and dizziness which have been a more chronic problem but worsening recently. Per ED provider note, on 3/5 patient called 911 because she reported that someone was standing over her in her house, but no one was there.       Per chart, the patient visited this ED approximately 1 week ago on 12/12/22 for low back pain, saddle anesthesia, and bowel incontinence c/f spinal cord compression. She was transferred to [facility] ED. There, MRI [was] without evidence of fracture or cord compression. 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 Patient was directed to follow up with PCP about these findings.      Patient denies fever/chills, abdominal pain, headaches, shortness of breath, chest pain.    ER course was notable for afebrile, initially hypotensive, improved with IVF. On 4L [place] saturating in high 90s. Labs notable for leukocytosis 12.5, creatinine 2.46. Utox positive for amphetamine (though patient is on bupropion which can cause this), cannabinoids. UA with moderate leuk esterase, 8 WBC.       Med Rec Confidence   I reviewed the Medication List. The current list is Accurate    Physical Exam   Temp:  [36.9 ??C (98.5 ??F)] 36.9 ??C (98.5 ??F)  Heart Rate:  [62-73] 67  SpO2 Pulse:  [62-85] 66  Resp:  [13-23] 18  BP: (62-131)/(51-113) 126/64  SpO2:  [90 %-98 %] 96 %  There is no height or weight on file to calculate BMI.  General: well-developed, appears slightly agitated/anxious, no acute distress  HEENT: EOMI. Sclerae anicteric and without injection. Oropharynx moist.   NECK: trachea midline and mobile, no submandibular or cervical lymphadenopathy  RESP: diminished breath sounds throughout, no increased work of breathing on 4L [place]  CV: Regular rate and rhythm. Normal S1 and S2. No murmurs or gallops.   ABD: NABS, soft, non-distended, non-tender to palpation  EXT: No lower extremity edema, cyanosis, or clubbing. Posterior tibial pulses are 2+ and symmetric.   MSK: No edema, normal range of motion  SKIN: No rashes or lesions  NEURO: No focal neurologic deficits, alert, oriented  to self and place. Does answer questions, speech often does not make sense.   PSYCH: slightly anxious/agitated

## 2022-12-19 NOTE — Unmapped (Signed)
BIB [facility] from home.   Per family pt with AMS since yesterday.   Talking out of her head.   Falling a lot recently.     74 Gluc for EMS initially.  --> 105 after oral glucose    Not compliant for checking diabetes.

## 2022-12-19 NOTE — Unmapped (Signed)
Problem: Adult Inpatient Plan of Care  Goal: Plan of Care Review  Outcome: Ongoing - Unchanged  Goal: Absence of Hospital-Acquired Illness or Injury  Intervention: Identify and Manage Fall Risk  Recent Flowsheet Documentation  Taken 12/19/2022 0800 by Elbert Ewings, RN  Safety Interventions: (Telesitter set up per MD order)   bed alarm   fall reduction program maintained     Problem: Fall Injury Risk  Goal: Absence of Fall and Fall-Related Injury  Intervention: Promote Injury-Free Environment  Recent Flowsheet Documentation  Taken 12/19/2022 0800 by Elbert Ewings, RN  Safety Interventions: (Telesitter set up per MD order)   bed alarm   fall reduction program maintained

## 2022-12-19 NOTE — Unmapped (Signed)
PHYSICAL THERAPY  Evaluation (12/19/22 1251)          Patient Name:  Marie Quinn       Medical Record Number: [id number]   Date of Birth: [birth year]  Sex: Female        Treatment Diagnosis: Decreased functional mobility     Activity Tolerance: Tolerated treatment well     ASSESSMENT  Problem List: Decreased strength, Decreased endurance, Decreased mobility, Fall risk, Decreased cognition      Assessment : Veeha Backes is a 67 y.o. female with past medical history of CHF, COPD, MI, asthma, HTN, HLD, substance abuse, fibromyalgia, BPPV, depression, and panic disorder presenting with altered mental status.     Pt agreeable to therapy and motivated to go for a walk.  Pt transfered supine<>sit with min A, needing assistance for intitating trunk elevation off bed surface, pt had no c/o dizziness. Pt sit<>stand SBA, using rollator for UE support. Pt is on 2 L [place] with Spo2 at 96%. Pt ambulated with rollator and SBA for 235' demonstrating appropriate gait speed and reciprocal gait pattern.  Pt would benefit from additional PT visist for training on use of assistive device as pt not currently using Rollator at home.  Recommend post acute services 3x/community.      Today's Interventions: AMPAC 23/24; PT Eval and POC; Functional transfers, Ambulation 20' with rollator.     Personal Factors/Comorbidities Present: 3+   Examination of Body System: Musculoskeletal, Activity/participation  Clinical Presentation: Evolving    Clinical Decision Making: Moderate        PLAN  Planned Frequency of Treatment:  1-2x per day for: 3-4x week       Planned Interventions: Education (Patient/Family/Caregiver), Therapeutic Exercise, Therapeutic Activity     Post-Discharge Physical Therapy Recommendations:  PT Post Acute Discharge Recommendations: Skilled PT services indicated, 3x weekly, Tourist information centre manager      PT DME Recommendations: Rollator            Goals:   Patient and Family Goals: Not stated.     SHORT GOAL #1: Pt will perform all functional transfers Mod I LRAD.               Time Frame : 1 week  SHORT GOAL #2: Pt will ambulate 300' with LRAD Mod I              Time Frame : 1 week  SHORT GOAL #3: Pt will go up/down 3 steps with/without rail and Mod I assit.              Time Frame : 1 week         Prognosis:  Good  Positive Indicators: PLOF  Barriers to Discharge: Decreased caregiver support, Decreased safety awareness, Endurance deficits     SUBJECTIVE  Patient reports: Pt states her IV is burning in her hand. Pt reports extensive complex social history with relationship issues with duaghter and ex-boyfriend.        Prior Functional Status: Pt states she was independent in the home and occasionaly used a SPC. Pt lives with her daughter and granddaughter.  Pt with ED visits 09/2022 and 11/2022 as a result of falls, per chart review.  Equipment available at home: Straight cane        Past Medical History:   Diagnosis Date    Achilles tendinitis, right leg 2021    Anxiety     Arthritis     Adult life    Asthma  BPPV (benign paroxysmal positional vertigo)     Chronic diastolic CHF (congestive heart failure) (CMS-HCC)     COPD (chronic obstructive pulmonary disease) (CMS-HCC)     Depression     Fibromyalgia     GERD (gastroesophageal reflux disease)     Adult life    Heart disease     Heart murmur     Hyperlipidemia     Hypertension     Myocardial infarction (CMS-HCC) 2008    s/p stent placement x 1 at [facility]    Obesity     Panic disorder     Substance abuse (CMS-HCC)     Type 2 diabetes mellitus, without [place]-term current use of insulin (CMS-HCC) 12/19/2022    Urge incontinence             Social History     Tobacco Use    Smoking status: Every Day     Current packs/day: 0.00     Types: Cigarettes     Start date: 05/01/1994     Last attempt to quit: 05/02/2019     Years since quitting: 3.6    Smokeless tobacco: Never    Tobacco comments:     Pt smokes  1.5cpd. Pt expressed desire to quit    Substance Use Topics    Alcohol use: Yes Past Surgical History:   Procedure Laterality Date    cardiac stent x 1      CHOLECYSTECTOMY      HYSTERECTOMY  1990    one ovary remains    PR COLORECTAL SCRN; HI RISK IND N/A 08/06/2019    Procedure: COLOREC CANCR SCR; COLNSCPY;  Surgeon: Charm Rings, MD;  [facility];  Service: Gastroenterology    PR ERCP REMOVE FOREIGN BODY/STENT BILIARY/PANC DUCT N/A 08/09/2019    Procedure: ENDOSCOPIC RETROGRADE CHOLANGIOPANCREATOGRAPHY (ERCP); W/ REMOVAL OF FOREIGN BODY/STENT FROM BILIARY/PANCREATIC DUCT(S);  Surgeon: Vonda Antigua, MD;  Location: [facility];  Service: Gastroenterology    PR ERCP STENT PLACEMENT BILIARY/PANCREATIC DUCT N/A 06/28/2019    Procedure: ENDOSCOPIC RETROGRADE CHOLANGIOPANCREATOGRAPHY (ERCP); WITH PLACEMENT OF ENDOSCOPIC STENT INTO BILIARY OR PANCREATIC DUCT;  Surgeon: Vonda Antigua, MD;  Location: [facility];  Service: Gastroenterology    PR ERCP,SPHINCTEROTOMY N/A 11/12/2018    Procedure: ERCP; W/SPHINCTEROTOMY/PAPILLOTOMY;  Surgeon: Vonda Antigua, MD;  Location: [facility];  Service: Gastroenterology    PR ERCP,W/REMOVAL STONE,BIL/PANCR DUCTS N/A 11/12/2018    Procedure: ERCP; W/ENDOSCOPIC RETROGRADE REMOVAL OF CALCULUS/CALCULI FROM BILIARY &/OR PANCREATIC DUCTS;  Surgeon: Vonda Antigua, MD;  [facility]: [facility];  Service: Gastroenterology    TONSILLECTOMY      TUBAL LIGATION  1990    Tubal pregnacy             Family History   Problem Relation Age of Onset    Heart disease Father         Deacesed    Coronary artery disease Father         quadruple bypass    Mental illness Father     Asthma Father     COPD Father     Depression Father     Hypertension Father     Cancer Sister     Arthritis Daughter     Depression Daughter     Asthma Sister     Cancer Sister         Colan cancer deceased    COPD Sister  Depression Sister     Early death Sister         80 yrs old Cancer Paternal Aunt         Thyroid cancer    Cancer Maternal Grandmother         Brain cancer    Depression Mother     Diabetes Mother     Hypertension Mother     Diabetes Brother     Vision loss Maternal Aunt         Blind at birth    Substance Abuse Disorder Neg Hx     Alcohol abuse Neg Hx     Drug abuse Neg Hx         Allergies: Iodine and iodide containing products, Statins-hmg-coa reductase inhibitors, Sulfa (sulfonamide antibiotics), and Oxycodone                  Objective Findings  Precautions / Restrictions  Precautions: Falls precautions  Weight Bearing Status: Non-applicable  Required Braces or Orthoses: Non-applicable     Communication Preference: Verbal, Visual          Pain Comments: Pt c/o min back pain when sup<>sit stating she believes it is from laying in the MRI machine.     Equipment / Environment: Vascular access (PIV, TLC, Port-a-cath, PICC), Supplemental oxygen     Vitals/Orthostatics : Supine: BP 118/60, MAP 82 HR 70; sitting EOB: BP 117/61,MAP 84, HR 75; On 2 L [place] 96%, RA flucuated between 88-95%     Living Situation  Living Environment: Trailer/Mobile home  Lives With: Daughter, Alone (daughter in-and-out)  Home Living: One level home, Standard height toilet, Tub/shower unit  Caregiver Identified?: No      Cognition: WFL  Cognition comment: Pt mentioned she was still having hallucinations, able to answer questions and follow commands.  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)  Posture: Forward head, Rounded shoulders    Static Sitting-Level of Assistance: Independent  Dynamic Sitting-Level of Assistance: Clinical biochemist Standing-Level of Assistance: Supervision  Dynamic Standing - Level of Assistance: Supervision  Standing Balance comments: history of multiple falls with visits to ED, reports bouncing off walls at home due to decreased balance, would benefit from formal balance test      Bed Mobility: Supine to Sit  Supine to Sit assistance level: Minimal assist, patient does 75% or more  Bed Mobility: Pt c/o back pain during supine<>sit, needing minimal assist to initiate sitting up.     Transfers: Sit to Stand  Sit to Stand assistance level: Standby assist, set-up cues, supervision of patient - no hands on  Transfer comments: Pt sit<>stand to Rollator for UE support, cues for unlocking brakes.      Gait Level of Assistance: Standby assist, set-up cues, supervision of patient - no hands on  Gait Assistive Device: Four wheel walker  Gait Distance Ambulated (ft): 235 ft  Skilled Treatment Performed: Pt ambulated 235' with Rollator and SBA. Pt demonstrated  appropriate gait speed with a reciprocal step pattern.     Stairs: N/T       Endurance: able to talk throughout session    Patient at end of session: All needs in reach, In chair, Nurse notified    Physical Therapy Session Duration  PT Individual [mins]: 50        I attest that I have reviewed the above information.  Signed: Anders Grant, PT  Filed 12/19/2022     The care for this patient was completed by Anders Grant, PT:  A student was present and participated in the care. Licensed/Credentialed therapist was physically present and immediately available to direct and supervise tasks that were related to patient management. The direction and supervision was continuous throughout the time these tasks were performed.

## 2022-12-19 NOTE — Unmapped (Signed)
Bed: 03  Expected date:   Expected time:   Means of arrival:   Comments:  EMS

## 2022-12-20 ENCOUNTER — Inpatient Hospital Stay: Admit: 2022-12-20 | Payer: MEDICARE

## 2022-12-20 LAB — CBC
HEMATOCRIT: 32.4 % — ABNORMAL LOW (ref 34.0–44.0)
HEMOGLOBIN: 10.5 g/dL — ABNORMAL LOW (ref 11.3–14.9)
MEAN CORPUSCULAR HEMOGLOBIN CONC: 32.3 g/dL (ref 32.0–36.0)
MEAN CORPUSCULAR HEMOGLOBIN: 27.1 pg (ref 25.9–32.4)
MEAN CORPUSCULAR VOLUME: 84.2 fL (ref 77.6–95.7)
MEAN PLATELET VOLUME: 9 fL (ref 6.8–10.7)
PLATELET COUNT: 188 10*9/L (ref 150–450)
RED BLOOD CELL COUNT: 3.85 10*12/L — ABNORMAL LOW (ref 3.95–5.13)
RED CELL DISTRIBUTION WIDTH: 17.4 % — ABNORMAL HIGH (ref 12.2–15.2)
WBC ADJUSTED: 7.6 10*9/L (ref 3.6–11.2)

## 2022-12-20 LAB — BASIC METABOLIC PANEL
ANION GAP: 3 mmol/L — ABNORMAL LOW (ref 5–14)
BLOOD UREA NITROGEN: 17 mg/dL (ref 9–23)
BUN / CREAT RATIO: 23
CALCIUM: 8.8 mg/dL (ref 8.7–10.4)
CHLORIDE: 110 mmol/L — ABNORMAL HIGH (ref 98–107)
CO2: 30 mmol/L (ref 20.0–31.0)
CREATININE: 0.74 mg/dL
EGFR CKD-EPI (2021) FEMALE: 90 mL/min/{1.73_m2} (ref >=60)
GLUCOSE RANDOM: 155 mg/dL (ref 70–179)
POTASSIUM: 4.4 mmol/L (ref 3.4–4.8)
SODIUM: 143 mmol/L (ref 135–145)

## 2022-12-20 LAB — MAGNESIUM: MAGNESIUM: 2.1 mg/dL (ref 1.6–2.6)

## 2022-12-20 MED ADMIN — aspirin chewable tablet 81 mg: 81 mg | ORAL | @ 14:00:00

## 2022-12-20 MED ADMIN — DULoxetine (CYMBALTA) DR capsule 20 mg: 20 mg | ORAL | @ 14:00:00

## 2022-12-20 MED ADMIN — insulin lispro (HumaLOG) injection 0-20 Units: 0-20 [IU] | SUBCUTANEOUS | @ 03:00:00

## 2022-12-20 MED ADMIN — lamoTRIgine (LaMICtal) tablet 125 mg: 125 mg | ORAL | @ 03:00:00

## 2022-12-20 MED ADMIN — insulin lispro (HumaLOG) injection 0-20 Units: 0-20 [IU] | SUBCUTANEOUS | @ 22:00:00

## 2022-12-20 MED ADMIN — lamoTRIgine (LaMICtal) tablet 125 mg: 125 mg | ORAL | @ 14:00:00

## 2022-12-20 MED ADMIN — insulin lispro (HumaLOG) injection 0-20 Units: 0-20 [IU] | SUBCUTANEOUS | @ 17:00:00

## 2022-12-20 MED ADMIN — cefTRIAXone (ROCEPHIN) 1 g in sodium chloride 0.9 % (NS) 100 mL IVPB-MBP: 1 g | INTRAVENOUS | @ 03:00:00 | Stop: 2022-12-21

## 2022-12-20 MED ADMIN — buPROPion (Wellbutrin SR) 12 hr tablet 300 mg: 300 mg | ORAL | @ 14:00:00

## 2022-12-20 MED ADMIN — heparin (porcine) 5,000 unit/mL injection 5,000 Units: 5000 [IU] | SUBCUTANEOUS | @ 03:00:00

## 2022-12-20 MED ADMIN — montelukast (SINGULAIR) tablet 10 mg: 10 mg | ORAL | @ 03:00:00

## 2022-12-20 MED ADMIN — acetaminophen (TYLENOL) tablet 650 mg: 650 mg | ORAL | @ 08:00:00

## 2022-12-20 MED ADMIN — pantoprazole (Protonix) EC tablet 20 mg: 20 mg | ORAL | @ 14:00:00

## 2022-12-20 MED ADMIN — heparin (porcine) 5,000 unit/mL injection 5,000 Units: 5000 [IU] | SUBCUTANEOUS | @ 20:00:00

## 2022-12-20 MED ADMIN — heparin (porcine) 5,000 unit/mL injection 5,000 Units: 5000 [IU] | SUBCUTANEOUS | @ 10:00:00

## 2022-12-20 MED ADMIN — bisacodyl (DULCOLAX) suppository 10 mg: 10 mg | RECTAL | @ 16:00:00 | Stop: 2022-12-20

## 2022-12-20 MED ADMIN — pantoprazole (Protonix) EC tablet 20 mg: 20 mg | ORAL | @ 03:00:00

## 2022-12-20 NOTE — Unmapped (Signed)
Hospitalist Daily Progress Note     LOS: 1 day under inpatient status    Assessment/Plan:  Principal Problem:    Disorientation  Active Problems:    Hypertension    Mild intermittent asthma without complication    Anxiety    Depression    Post traumatic stress disorder (PTSD)    Fibromyalgia    Panic disorder    Dyslipidemia    Obstructive sleep apnea    Type 2 diabetes mellitus, without long-term current use of insulin (CMS-HCC)    Hallucinations    AKI (acute kidney injury) (CMS-HCC)  Resolved Problems:    * No resolved hospital problems. *         Disorientation - Altered mental status with hallucinations - dizziness:   Presented with acute on chronic feelings of confusion, hallucinations, and dizziness with associated frequent falls.  Most suspicious for polypharmacy, perhaps worsened by acute illness (acute cystitis, hypotension, hypoglycemia and AKI) and substance use (cannabinoids, amphetamines may be a false positive from bupropion). She reports she is overall feeling better, but not yet normal.  We discussed that it may take time as we adjust her medications, which can be continued in the outpatient setting. I personally spoke with her outpatient provider, Estanislado Spire who reported that she had been having feelings of dizziness and falls during their last visit in Dec 2023. She was supposed to see neurology but never made the appointment.   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   -Continue telesitter  -Continue bupropion 300 mg daily, lamotrigine 125 mg twice daily  -Continue duloxetine 20 mg daily (this dose was recently weaned from 1)  -Hold Lyrica and tizanidine, recommend discontinuing these medications  -Spoke with her PCP will help arrange follow-up this week given change in medications  -Agree with outpatient neurology follow-up  - PT/OT  - DME: Patient would benefit from a bedside commode due to being confined to her bedroom without a bathroom for prolonged periods of the day.     Secondary/Additional Active Problems:  Acute cystitis: Completing ceftriaxone for 3 days therapy (3/6 - 3/8)  -Follow-up urine culture, too young to read     Chronic hypoxic respiratory failure - OSA: On home oxygen 3 L.  At baseline.  - incentive spirometry as able  - CPAP nightly     T2DM: last hemoglobin A1C 6.2.  She has had a few episodes of hypoglycemia while inpatient, which may be contributing to her symptoms above.  - recheck hemoglobin A1C  - hold home rybelsus  - Discontinue home glimepiride due to increased risk of hypoglycemia  -Continue SSI while inpatient     Neuropathy: Do have some concern that patient's Lyrica may be contributing to polypharmacy as above.   -Stop Lyrica, she will follow-up with her PCP as discussed above     Anxiety/depression/panic disorder/PTSD: On multiple medications, primary prescriber is her PCP.  She has been weaning down on her Cymbalta as an outpatient.  Suspect polypharmacy is playing a role into her symptoms.  -Continue bupropion 300 mg daily, lamotrigine 125 mg twice daily  -Continue duloxetine 20 mg daily (this dose was recently weaned from 60)  -Hold Lyrica, hydroxyzine, and tizanidine, recommend discontinuing these medications     History of Hypertension: hypotensive in ED, improved with IVF.  Home antihypertensives include amlodipine 10mg , metoprolol succinate 100mg , losartan 100mg   - holding home antihypertensives for now      HLD -  CAD: on home repatha, will not do while in hospital  - cont asa 81mg  daily     Asthma: cont home singulair, prn albuterol nebsDisorientation    AKI (Resolved): 2/2 pre-renal injury.     Daily Checklist  Diet: Regular  VTE ppx: Heparin SQ  GI ppx: Home PPI  Lytes: Replete PRN  IV Access: PIV  Code status: Full  Dispo: Anticipate d/c on 3/9 with [facility]    Please page the [facility]) pager at [phone] with questions.    Consultants:   1.  none    Pending labs:   Pending Labs Order Current Status    Urine Culture Preliminary result              Subjective:   Patient doing a little bit better today.  Reports that her insides are feeling better.  She denies any fevers, chills, chest pain, shortness of breath, abdominal pain, diarrhea, constipation, lower extremity swelling.    Objective:   Gen: Well appearing, pleasant and cooperative,  HEENT: NCAT, EOMI  CV: Normal rate, regular rhythm, no murmurs  Pulm: CTAB, nasal cannula in place  GI: Abdomen soft and nontender  Extremities: Warm without edema,   Neuro: Proprioception and sensation intact in bilateral feet with the exception of her right great toe    Labs/Studies:  Labs and Studies from the last 24hrs per EMR and Reviewed      ADMINISTRATIVE  Greater than 35 minutes total spent on this encounter, including preparing to see the patient, seeing and examining the patient, counseling the patient, placing orders, discussing the patient with other providers, documenting the encounter, and coordinating care.    Data for medical decision-making today included reviewing labs, reviewing imaging, reviewing consultants' notes, and obtaining additional collateral/corroborating information from an independent historian.    Issues Impacting Complexity of Management:  I personally reviewed new and updated documentation. , I independently reviewed and interpreted all new lab findings  . , and Discussed the patient's management and/or test interpretation with the patients primary care provider    -The patient is at high risk from Hospital immobility in an elderly patient given baseline poor functional status with a high risk of causing delirium and further decline in function

## 2022-12-20 NOTE — Unmapped (Addendum)
Pt is alert and oriented this shift; no hallucinations voiced. Does not use call bell before ambulating, however. Bed alarm and telesitter monitoring for safety. Prn tylenol and heating pad given for chronic back pain. Will continue to monitor.     Problem: Adult Inpatient Plan of Care  Goal: Plan of Care Review  Outcome: Progressing  Flowsheets (Taken 12/20/2022 0251)  Progress: improving  Plan of Care Reviewed With: patient  Goal: Patient-Specific Goal (Individualized)  Outcome: Progressing  Goal: Absence of Hospital-Acquired Illness or Injury  Outcome: Progressing  Intervention: Identify and Manage Fall Risk  Recent Flowsheet Documentation  Taken 12/20/2022 0200 by Sigmund Hazel, RN  Safety Interventions:   bed alarm   fall reduction program maintained   nonskid shoes/slippers when out of bed  Taken 12/20/2022 0000 by Sigmund Hazel, RN  Safety Interventions:   bed alarm   fall reduction program maintained   nonskid shoes/slippers when out of bed  Taken 12/19/2022 2200 by Sigmund Hazel, RN  Safety Interventions:   bed alarm   fall reduction program maintained   nonskid shoes/slippers when out of bed  Taken 12/19/2022 2000 by Sigmund Hazel, RN  Safety Interventions:   fall reduction program maintained   nonskid shoes/slippers when out of bed   bed alarm  Intervention: Prevent and Manage VTE (Venous Thromboembolism) Risk  Recent Flowsheet Documentation  Taken 12/20/2022 0200 by Sigmund Hazel, RN  Anti-Embolism Intervention: (hep subq) --  Taken 12/20/2022 0000 by Sigmund Hazel, RN  Anti-Embolism Intervention: (hep subq) --  Taken 12/19/2022 2200 by Sigmund Hazel, RN  Anti-Embolism Intervention: (hep subq) --  Taken 12/19/2022 2000 by Sigmund Hazel, RN  Anti-Embolism Intervention: (hep subq) Other (Comment)  Goal: Optimal Comfort and Wellbeing  Outcome: Progressing  Goal: Readiness for Transition of Care  Outcome: Progressing  Goal: Rounds/Family Conference  Outcome: Progressing     Problem: Fall Injury Risk  Goal: Absence of Fall and Fall-Related Injury  Outcome: Progressing  Intervention: Promote Injury-Free Environment  Recent Flowsheet Documentation  Taken 12/20/2022 0200 by Sigmund Hazel, RN  Safety Interventions:   bed alarm   fall reduction program maintained   nonskid shoes/slippers when out of bed  Taken 12/20/2022 0000 by Sigmund Hazel, RN  Safety Interventions:   bed alarm   fall reduction program maintained   nonskid shoes/slippers when out of bed  Taken 12/19/2022 2200 by Sigmund Hazel, RN  Safety Interventions:   bed alarm   fall reduction program maintained   nonskid shoes/slippers when out of bed  Taken 12/19/2022 2000 by Sigmund Hazel, RN  Safety Interventions:   fall reduction program maintained   nonskid shoes/slippers when out of bed   bed alarm     Problem: Self-Care Deficit  Goal: Improved Ability to Complete Activities of Daily Living  Outcome: Progressing     Problem: Comorbidity Management  Goal: Maintenance of COPD Symptom Control  Outcome: Progressing  Goal: Blood Glucose Levels Within Targeted Range  Outcome: Progressing     Problem: Skin Injury Risk Increased  Goal: Skin Health and Integrity  Outcome: Progressing  Intervention: Optimize Skin Protection  Recent Flowsheet Documentation  Taken 12/19/2022 2000 by Sigmund Hazel, RN  Activity Management: bedrest  Head of Bed (HOB) Positioning: HOB at 30 degrees

## 2022-12-20 NOTE — Unmapped (Signed)
BSC documentation needed.For the commode:  The patient is confined to one room.  OR The patient is confirmed to one level of the home with no toilet on that level.

## 2022-12-20 NOTE — Unmapped (Signed)
Adult Nutrition Assessment Note    Visit Type: RN Consult  Reason for Visit: Per Admission Nutrition Screen (Adult)      HPI & PMH:  67 y.o. female who is presenting to [facility] with Disorientation, in the setting of the following pertinent/contributing co-morbidities: anxiety, depression, PTSD .       Anthropometric Data:  Height: 170.2 cm (5' 7)   Admission weight: (!) 115 kg (253 lb 9.6 oz)  Last recorded weight: (!) 115 kg (253 lb 9.6 oz)  IBW: 61.27 kg  Percent IBW: 187.75 %  BMI: Body mass index is 39.72 kg/m??.   Usual Body Weight:  260 lb per patient report    Weight history prior to admission:  stable weight noted    Wt Readings from Last 10 Encounters:   12/19/22 (!) 115 kg (253 lb 9.6 oz)   12/12/22 (!) 111.1 kg (245 lb)   12/13/22 (!) 111.1 kg (245 lb)   03/29/20 (!) 111.1 kg (245 lb)   03/22/20 (!) 111.1 kg (245 lb)   02/08/20 (!) 112 kg (247 lb)   11/10/19 (!) 109.3 kg (240 lb 14.4 oz)   08/06/19 (!) 104.3 kg (230 lb)   06/28/19 (!) 103.9 kg (229 lb)   06/10/19 (!) 104.3 kg (230 lb)        Weight changes this admission:   Last 5 Recorded Weights    12/19/22 0100   Weight: (!) 115 kg (253 lb 9.6 oz)        Nutrition Focused Physical Exam:  Nutrition Focused Physical Exam:                        Nutrition Evaluation  Overall Impressions: Nutrition-Focused Physical Exam not indicated due to lack of malnutrition risk factors. (12/20/22 1304)      NUTRITIONALLY RELEVANT DATA     Medications:   Nutritionally pertinent medications reviewed and evaluated for potential food and/or medication interactions. Protonix, Magnesium sulfate 2 gram/50 ml IVPB    Labs:   Nutritionally pertinent labs reviewed.     Nutrition History:   December 20, 2022: Prior to admission: No nutritional issues reported prior to admission.     Allergies, Intolerances, Sensitivities, and/or Cultural/Religious Dietary Restrictions: none identified per chart review at this time     Current Nutrition:  Oral intake          Nutritional Needs:   Healthy balance of carbohydrate, protein, and fat.       Malnutrition Assessment using AND/ASPEN or GLIM Clinical Characteristics:    Patient does not meet AND/ASPEN criteria for malnutrition at this time (12/20/22 1304)               GOALS and EVALUATION     Patient to consume 70% or greater of po intake via combination of meals, snacks, and/or oral supplements within admission.  - Met/No longer indicated    Motivation, Barriers, and Compliance:  Evaluation of motivation, barriers, and compliance pending at this time due to clinical status.     NUTRITION ASSESSMENT     Current nutrition therapy is appropriate and likely meeting meeting nutritional needs at this time.       Discharge Planning:   Monitor for potential discharge needs with multi-disciplinary team.     Was the nutrition care plan completed? No, patient does not meet malnutrition criteria       NUTRITION INTERVENTIONS and RECOMMENDATION     Weigh weekly    Follow-Up Parameters:  Signing off at this time (Please reconsult if needed)    Alesia Morin, RD, LDN

## 2022-12-21 LAB — CBC
HEMATOCRIT: 32.5 % — ABNORMAL LOW (ref 34.0–44.0)
HEMOGLOBIN: 10.3 g/dL — ABNORMAL LOW (ref 11.3–14.9)
MEAN CORPUSCULAR HEMOGLOBIN CONC: 31.7 g/dL — ABNORMAL LOW (ref 32.0–36.0)
MEAN CORPUSCULAR HEMOGLOBIN: 26.8 pg (ref 25.9–32.4)
MEAN CORPUSCULAR VOLUME: 84.5 fL (ref 77.6–95.7)
MEAN PLATELET VOLUME: 8.8 fL (ref 6.8–10.7)
PLATELET COUNT: 199 10*9/L (ref 150–450)
RED BLOOD CELL COUNT: 3.85 10*12/L — ABNORMAL LOW (ref 3.95–5.13)
RED CELL DISTRIBUTION WIDTH: 16.9 % — ABNORMAL HIGH (ref 12.2–15.2)
WBC ADJUSTED: 7.3 10*9/L (ref 3.6–11.2)

## 2022-12-21 LAB — BASIC METABOLIC PANEL
ANION GAP: 4 mmol/L — ABNORMAL LOW (ref 5–14)
BLOOD UREA NITROGEN: 14 mg/dL (ref 9–23)
BUN / CREAT RATIO: 23
CALCIUM: 8.9 mg/dL (ref 8.7–10.4)
CHLORIDE: 110 mmol/L — ABNORMAL HIGH (ref 98–107)
CO2: 31.4 mmol/L — ABNORMAL HIGH (ref 20.0–31.0)
CREATININE: 0.61 mg/dL
EGFR CKD-EPI (2021) FEMALE: >90 mL/min/{1.73_m2} (ref >=60)
GLUCOSE RANDOM: 106 mg/dL (ref 70–179)
POTASSIUM: 4.1 mmol/L (ref 3.4–4.8)
SODIUM: 145 mmol/L (ref 135–145)

## 2022-12-21 LAB — MAGNESIUM: MAGNESIUM: 1.8 mg/dL (ref 1.6–2.6)

## 2022-12-21 MED ORDER — OXYCODONE 5 MG TABLET
ORAL_TABLET | ORAL | 0 refills | 2 days | Status: CP | PRN
Start: 2022-12-21 — End: 2022-12-26
  Filled 2022-12-21: qty 10, 2d supply, fill #0

## 2022-12-21 MED ORDER — MEDICAL SUPPLY ITEM
0 refills | 0 days | Status: CP
Start: 2022-12-21 — End: ?

## 2022-12-21 MED ORDER — ALBUTEROL SULFATE 2.5 MG/3 ML (0.083 %) SOLUTION FOR NEBULIZATION
RESPIRATORY_TRACT | 1 refills | 5 days | Status: CP | PRN
Start: 2022-12-21 — End: 2023-12-21
  Filled 2022-12-21: qty 90, 5d supply, fill #0

## 2022-12-21 MED ORDER — DULOXETINE 20 MG CAPSULE,DELAYED RELEASE
ORAL_CAPSULE | Freq: Every day | ORAL | 2 refills | 30 days | Status: CP
Start: 2022-12-21 — End: 2023-03-21

## 2022-12-21 MED ORDER — AMLODIPINE 10 MG TABLET
ORAL_TABLET | Freq: Every day | ORAL | 2 refills | 90 days | Status: CP
Start: 2022-12-21 — End: 2023-12-21

## 2022-12-21 MED ADMIN — pantoprazole (Protonix) EC tablet 20 mg: 20 mg | ORAL | @ 14:00:00 | Stop: 2022-12-21

## 2022-12-21 MED ADMIN — heparin (porcine) 5,000 unit/mL injection 5,000 Units: 5000 [IU] | SUBCUTANEOUS | @ 02:00:00

## 2022-12-21 MED ADMIN — oxyCODONE (ROXICODONE) immediate release tablet 5 mg: 5 mg | ORAL | @ 03:00:00 | Stop: 2022-12-20

## 2022-12-21 MED ADMIN — insulin lispro (HumaLOG) injection 0-20 Units: 0-20 [IU] | SUBCUTANEOUS | @ 01:00:00

## 2022-12-21 MED ADMIN — lidocaine 4 % patch 1 patch: 1 | TRANSDERMAL | @ 14:00:00 | Stop: 2022-12-21

## 2022-12-21 MED ADMIN — losartan (COZAAR) tablet 100 mg: 100 mg | ORAL | @ 14:00:00 | Stop: 2022-12-21

## 2022-12-21 MED ADMIN — pantoprazole (Protonix) EC tablet 20 mg: 20 mg | ORAL | @ 01:00:00

## 2022-12-21 MED ADMIN — oxyCODONE (ROXICODONE) immediate release tablet 5 mg: 5 mg | ORAL | @ 16:00:00 | Stop: 2022-12-21

## 2022-12-21 MED ADMIN — aspirin chewable tablet 81 mg: 81 mg | ORAL | @ 14:00:00 | Stop: 2022-12-21

## 2022-12-21 MED ADMIN — acetaminophen (TYLENOL) tablet 650 mg: 650 mg | ORAL | @ 02:00:00

## 2022-12-21 MED ADMIN — insulin lispro (HumaLOG) injection 0-20 Units: 0-20 [IU] | SUBCUTANEOUS | @ 17:00:00 | Stop: 2022-12-21

## 2022-12-21 MED ADMIN — lamoTRIgine (LaMICtal) tablet 125 mg: 125 mg | ORAL | @ 14:00:00 | Stop: 2022-12-21

## 2022-12-21 MED ADMIN — cefTRIAXone (ROCEPHIN) 1 g in sodium chloride 0.9 % (NS) 100 mL IVPB-MBP: 1 g | INTRAVENOUS | @ 01:00:00 | Stop: 2022-12-20

## 2022-12-21 MED ADMIN — lamoTRIgine (LaMICtal) tablet 125 mg: 125 mg | ORAL | @ 01:00:00

## 2022-12-21 MED ADMIN — montelukast (SINGULAIR) tablet 10 mg: 10 mg | ORAL | @ 01:00:00

## 2022-12-21 MED ADMIN — DULoxetine (CYMBALTA) DR capsule 20 mg: 20 mg | ORAL | @ 14:00:00 | Stop: 2022-12-21

## 2022-12-21 MED ADMIN — buPROPion (Wellbutrin SR) 12 hr tablet 300 mg: 300 mg | ORAL | @ 14:00:00 | Stop: 2022-12-21

## 2022-12-21 MED ADMIN — acetaminophen (TYLENOL) tablet 650 mg: 650 mg | ORAL | @ 08:00:00 | Stop: 2022-12-21

## 2022-12-21 MED ADMIN — acetaminophen (TYLENOL) tablet 650 mg: 650 mg | ORAL | @ 14:00:00 | Stop: 2022-12-21

## 2022-12-21 MED ADMIN — heparin (porcine) 5,000 unit/mL injection 5,000 Units: 5000 [IU] | SUBCUTANEOUS | @ 12:00:00 | Stop: 2022-12-21

## 2022-12-21 MED ADMIN — melatonin tablet 3 mg: 3 mg | ORAL | @ 08:00:00 | Stop: 2022-12-21

## 2022-12-21 MED ADMIN — amlodipine (NORVASC) tablet 10 mg: 10 mg | ORAL | @ 14:00:00 | Stop: 2022-12-21

## 2022-12-21 NOTE — Unmapped (Signed)
Pt in with Disorientation. VSS. Pt ACHS blood sugars. She is given IV Rocephin for UTI. Pt on 2L 02 via [place]. CPAP used at bedtime & heating pad utilized for back pain w/ effective relief   Pt able to verbalize understanding of POC. Fall & skin protocol maintained. Will continue to work toward prioritized goals.    Problem: Adult Inpatient Plan of Care  Goal: Plan of Care Review  Outcome: Progressing     Problem: Adult Inpatient Plan of Care  Goal: Patient-Specific Goal (Individualized)  Outcome: Progressing     Problem: Comorbidity Management  Goal: Blood Glucose Levels Within Targeted Range  Outcome: Progressing     Problem: Self-Care Deficit  Goal: Improved Ability to Complete Activities of Daily Living  Outcome: Progressing     6CIT Score for Cognitive Screening:  Complete for all new admissions, scoring found on the back of the Kardex  [] Normal (<8)    [] Significant (8 or higher)  [] Not yet completed this admission    CAM Delirium Screening Tool  Be sure to ask questions to gauge orientation and attention during your assessment  Feature Present? Scoring: CAM Positive if:   Acute onset (different from baseline) with Fluctuating course [x] Yes    [] No   Yes to BOTH of these AND.Marland KitchenMarland Kitchen   Inattention (days of the week or months of the year backward) [] Yes    [x] No    Disorganized thinking [] Yes    [x] No   Yes to at least   ONE of these   Altered level of consciousness  [] Yes    [x] No    CAM []  Positive  [x] Negative    CAM-S: Symptom Severity Score  Complete on all patients ?65 years old, including in patients with dementia  Do not complete if the patient is known to be non-verbal due to their dementia  Feature  Severity Score  Question   ACUTE ONSET  & FLUCTUATING  COURSE [] No (0)    [x] Yes (1)    Is there evidence of an acute change in mental status from the patient's baseline? Did the patient's behavior fluctuate at any point during the interview for any of the 10 features?   2. INATTENTION [x] No (0)    [] Yes, mild (1)  [] Yes, marked (2)  Did the patient have difficulty focusing attention, for example being easily distractible, or having difficulty keeping track of what was being said?   3. DISORGANIZED  THINKING [x] No (0)    [] Yes, mild (1)  [] Yes, marked (2)  Was the patient's thinking disorganized or incoherent, such as rambling or irrelevant conversation, unclear or illogical flow or of ideas, unpredictable switching from subject to subject?   4. ALTERED LEVEL  OF  CONSCIOUSNESS [x] Normal (0)    [] Mild: vigilant or lethargic (1)  [] Marked: stupor or coma (2)    Overall, how would you rate the patient's level of consciousness?   -Alert (normal)   -Vigilant  -Lethargic  -Stupor  -Coma  -Uncertain   5. DISORIENTATION [x] No (0)    [] Yes, mild (1)  [] Yes, marked (2)  Was the patient disoriented at any time during the interview, such as thinking he/she was somewhere other than the hospital, using the wrong bed, or misjudging the time of day?   6. MEMORY  IMPAIRMENT [x] No (0)    [] Yes, mild (1)  [] Yes, marked (2)    Did the patient demonstrate any memory problems during the interview, such as inability to remember events in the hospital or difficulty remembering  instructions?   7. PERCEPTUAL  DISTURBANCES [x] No (0)    [] Yes, mild (1)  [] Yes, marked (2)  Did the patient have any evidence of perceptual disturbances, for example, hallucinations, illusions, or misinterpretations (such as thinking something was moving when it was not)?   8. PSYCHOMOTOR  AGITATION [x] No (0)    [] Yes, mild (1)  [] Yes, marked (2)  At any time during the interview, did the patient have an unusually increased level of motor activity, such as restlessness, picking at bedclothes, tapping fingers, or making frequent sudden changes of position?   9. PSYCHOMOTOR  RETARDATION [x] No (0)    [] Yes, mild (1)  [] Yes, marked (2)  At any time during the interview, did the patient have an unusually decreased level of motor activity, such as sluggishness, staring into space, staying in one position for a long time, or moving very slowly?   10. ALTERED  SLEEP-WAKE  CYCLE  [x] No (0)    [] Yes, mild (1)  [] Yes, marked (2)  Did the patient have evidence of disturbance of the sleep-wake cycle, such as excessive daytime sleepiness with insomnia at   night?   Long Form   SEVERITY SCORE:  Severity Score (add rows 1-10)  Total (0-19):     Scoring the CAM-S: Rate each symptom of delirium listed in the CAM instrument as absent (0), mild (1), marked (2). Acute onset or fluctuation is rated as absent (0) or present (1). Summarize these scores into a composite.

## 2022-12-21 NOTE — Unmapped (Signed)
Physician Discharge Summary HBR  4 BT1 HBR  [address]  [place] [address]  Dept: [phone]  Loc: [phone]     Identifying Information:   Marie Quinn  [birth year]  [id number]    Primary Care Physician: Alonna Minium   Code Status: Full Code    Admit Date: 12/18/2022    Discharge Date: 12/21/2022     Discharge To: Home with Home Health and/or PT/OT    Discharge Service: HBR - HBC: Hospitalist Service #2     Discharge Attending Physician: Dwana Curd, MD    Discharge Diagnoses:  Principal Problem:    Disorientation (POA: Yes)  Active Problems:    Hypertension (POA: Yes)    Mild intermittent asthma without complication (POA: Yes)    Anxiety (POA: Yes)    Depression (POA: Yes)    Post traumatic stress disorder (PTSD) (POA: Yes)    Fibromyalgia (POA: Yes)    Panic disorder (POA: Yes)    Dyslipidemia (POA: Yes)    Obstructive sleep apnea (POA: Yes)    Type 2 diabetes mellitus, without long-term current use of insulin (CMS-HCC) (POA: Yes)    Hallucinations (POA: Yes)    AKI (acute kidney injury) (CMS-HCC) (POA: Yes)  Resolved Problems:    * No resolved hospital problems. *      Outpatient Provider Follow Up Issues:   As detailed below - medication management and follow-up on mood, neuropathy, dizziness, and hypertension given medication changes.     Hospital Course:     Disorientation - Altered mental status with hallucinations - dizziness - polypharmacy:   Presented with acute on chronic feelings of confusion, hallucinations, and dizziness with associated frequent falls.  Most suspicious for polypharmacy, perhaps worsened by acute illness (acute cystitis, hypotension, hypoglycemia and AKI) and substance use (cannabinoids, amphetamines may be a false positive from bupropion).  MRI brain was obtained showing an old lacunar infarct but no other acute findings.  B12 and folate were within normal limits as was a TSH.  During hospitalization, her symptoms improved on the day of discharge she reported no longer having feelings of dizziness, disorientation, or hallucinations.  I personally spoke with her outpatient provider on 12/20/2022 who reported that the patient has been having feelings of dizziness and falls as far back as her most recent visit in December 2023.  She was supposed to see neurology but never followed through on the appointment.  To address her polypharmacy the following changes were made to her medication regiment, all of which were discussed with her primary care provider:  -Lyrica was discontinued  -Tizanidine discontinued  -Hydroxyzine discontinued  -Metoprolol discontinued  She was seen by physical therapy and Occupational Therapy and additional DME was provided to help her with her mobility and safety in the home.    Acute cystitis: Completing ceftriaxone for 3 days therapy (3/6 - 3/8)     T2DM: last hemoglobin A1C 6.2.  She has had a few episodes of hypoglycemia while inpatient, which may be contributing to her symptoms above.  Her Rybelsus was held while inpatient and she was managed with sliding scale insulin.  Her glimepiride was discontinued on discharge due to increased risk of hypoglycemia.    Neuropathy: Her symptoms were well-managed as an inpatient.  Her Lyrica was discontinued as above    Anxiety/depression/panic disorder/PTSD: On multiple medications, primary prescriber is her PCP.  She has been weaning down on her Cymbalta as an outpatient.  Suspect polypharmacy is playing a role into her  symptoms.  Medication changes as above.  She was continued on her bupropion 300 mg daily, lamotrigine 125 mg twice daily, and duloxetine 20 mg daily.    Hypertension: Initially hypotensive that improved with IVF.  Her home antihypertensives were initially held, but her amlodipine 10 mg and her losartan 100 mg daily were resumed.  She reported that she was taking her losartan 100 mg twice daily which we advised against.  Her metoprolol was held on discharge given decently controlled blood pressures and risk of metoprolol with CNS symptoms.     AKI: She had a prerenal AKI that resolved  HLD - CAD: Repatha was held in the hospital, resumed on discharge.  She was continued on her aspirin 81 mg daily  Asthma: She was continued on her home Singulair.    ProcNoneedures:    No admission procedures for hospital encounter.  ______________________________________________________________________  Discharge Medications:     Your Medication List        STOP taking these medications      glimepiride 4 MG tablet  Commonly known as: AMARYL     metoPROLOL succinate 100 MG 24 hr tablet  Commonly known as: Toprol-XL     pregabalin 150 MG capsule  Commonly known as: LYRICA     tizanidine 4 MG capsule  Commonly known as: ZANAFLEX            START taking these medications      MEDICAL SUPPLY ITEM  Glucometer     oxyCODONE 5 MG immediate release tablet  Commonly known as: ROXICODONE  Take 1 tablet (5 mg total) by mouth every four (4) hours as needed for pain for up to 5 days.            CHANGE how you take these medications      albuterol 2.5 mg /3 mL (0.083 %) nebulizer solution  Inhale the contents of 1 vial (2.5 mg total) by nebulization every four (4) hours as needed for wheezing.  What changed: Another medication with the same name was removed. Continue taking this medication, and follow the directions you see here.     DULoxetine 20 MG capsule  Commonly known as: CYMBALTA  Take 1 capsule (20 mg total) by mouth daily.  What changed: medication strength     losartan 100 MG tablet  Commonly known as: COZAAR  Take 1 tablet (100 mg total) by mouth daily.  What changed: when to take this     oxybutynin 5 MG tablet  Commonly known as: DITROPAN  Take 1 tablet (5 mg total) by mouth two (2) times a day.  What changed:   when to take this  Another medication with the same name was removed. Continue taking this medication, and follow the directions you see here.            CONTINUE taking these medications amlodipine 10 MG tablet  Commonly known as: NORVASC  Take 1 tablet (10 mg total) by mouth daily.     aspirin 81 MG tablet  Commonly known as: ECOTRIN  Take 1 tablet (81 mg total) by mouth daily.     buPROPion 300 MG 24 hr tablet  Commonly known as: Wellbutrin XL  Take 1 tablet (300 mg total) by mouth daily.     hydrOXYzine 25 MG tablet  Commonly known as: ATARAX  Take 1 Tablet by mouth every 6 hours as needed for anxiety and congestion     lamoTRIgine 100 MG tablet  Commonly known as:  LaMICtal  Take 125 mg by mouth two (2) times a day.     lamoTRIgine 25 MG tablet  Commonly known as: LaMICtal  Take 1 tablet (25 mg total) by mouth two (2) times a day.     meclizine 25 mg tablet  Commonly known as: ANTIVERT  Take 1 tablet (25 mg total) by mouth Three (3) times a day as needed for dizziness.     meloxicam 15 MG tablet  Commonly known as: MOBIC  Take 1 tablet (15 mg total) by mouth daily.     montelukast 10 mg tablet  Commonly known as: SINGULAIR  Take 1 tablet (10 mg total) by mouth nightly.     omeprazole 20 MG capsule  Commonly known as: PriLOSEC  Take 1 capsule (20 mg total) by mouth two (2) times a day.     ondansetron 4 MG tablet  Commonly known as: ZOFRAN  TAKE ONE TABLET BY MOUTH EVERY 8 HOURS AS NEEDED FOR NAUSEA AND VOMITING FOR UP TO 5 DAYS     REPATHA SYRINGE 140 mg/mL Syrg  Generic drug: evolocumab  Inject the contents of 1 syringe (140 mg) under the skin every fourteen (14) days.     RYBELSUS 3 mg Tab  Generic drug: semaglutide  Take 3 mg by mouth daily.     VITAMIN D3 50 mcg (2,000 unit) Cap  Generic drug: cholecalciferol (vitamin D3-50 mcg (2,000 unit))  Take 3 capsules (150 mcg total) by mouth daily.              Allergies:  Iodine and iodide containing products, Statins-hmg-coa reductase inhibitors, Sulfa (sulfonamide antibiotics), and Oxycodone  ______________________________________________________________________  Pending Test Results (if blank, then none):  Pending Labs       Order Current Status    Urine Culture Preliminary result            Most Recent Labs:  All lab results last 24 hours -   Recent Results (from the past 24 hour(s))   POCT Glucose    Collection Time: 12/20/22  6:06 PM   Result Value Ref Range    Glucose, POC 189 (H) 70 - 179 mg/dL   POCT Glucose    Collection Time: 12/20/22  8:18 PM   Result Value Ref Range    Glucose, POC 227 (H) 70 - 179 mg/dL   Basic metabolic panel    Collection Time: 12/21/22  6:27 AM   Result Value Ref Range    Sodium 145 135 - 145 mmol/L    Potassium 4.1 3.4 - 4.8 mmol/L    Chloride 110 (H) 98 - 107 mmol/L    CO2 31.4 (H) 20.0 - 31.0 mmol/L    Anion Gap 4 (L) 5 - 14 mmol/L    BUN 14 9 - 23 mg/dL    Creatinine 0.98 1.19 - 1.02 mg/dL    BUN/Creatinine Ratio 23     eGFR CKD-EPI (2021) Female >90 >=60 mL/min/1.38m2    Glucose 106 70 - 179 mg/dL    Calcium 8.9 8.7 - 14.7 mg/dL   Magnesium Level    Collection Time: 12/21/22  6:27 AM   Result Value Ref Range    Magnesium 1.8 1.6 - 2.6 mg/dL   CBC    Collection Time: 12/21/22  6:27 AM   Result Value Ref Range    WBC 7.3 3.6 - 11.2 10*9/L    RBC 3.85 (L) 3.95 - 5.13 10*12/L    HGB 10.3 (L) 11.3 - 14.9 g/dL  HCT 32.5 (L) 34.0 - 44.0 %    MCV 84.5 77.6 - 95.7 fL    MCH 26.8 25.9 - 32.4 pg    MCHC 31.7 (L) 32.0 - 36.0 g/dL    RDW 09.8 (H) 11.9 - 15.2 %    MPV 8.8 6.8 - 10.7 fL    Platelet 199 150 - 450 10*9/L   POCT Glucose    Collection Time: 12/21/22  7:50 AM   Result Value Ref Range    Glucose, POC 91 70 - 179 mg/dL   POCT Glucose    Collection Time: 12/21/22  1:53 PM   Result Value Ref Range    Glucose, POC 159 70 - 179 mg/dL       Relevant Studies/Radiology (if blank, then none):  MRI Brain W Wo Contrast    Result Date: 12/19/2022  EXAM: Magnetic resonance imaging, brain, without and with contrast material. DATE: 12/19/2022 10:45 AM ACCESSION: [id number] UN DICTATED: 12/19/2022 10:56 AM INTERPRETATION LOCATION: [facility] CLINICAL INDICATION: 67 years old Female with AMS, hallucinations  COMPARISON: CT head 12/18/2022 TECHNIQUE: Multiplanar, multisequence MR imaging of the brain was performed without and with I.V. contrast. FINDINGS:  Ventricles are normal in size. There is no midline shift. No extra-axial fluid collection. No evidence of intracranial hemorrhage. Tiny perivascular spaces in the basal ganglia. T1 hyperintense and T2 hyperintense signal in the left caudate without enhancement or restricted diffusion. No diffusion weighted signal abnormality to suggest acute infarct. No mass. There is no abnormal enhancement.     --Chronic left caudate infarct. No acute infarct or acute intracranial abnormality.     ECG 12 Lead    Result Date: 12/19/2022  NORMAL SINUS RHYTHM NORMAL ECG WHEN COMPARED WITH ECG OF 17-Sep-2022 16:52, QUESTIONABLE CHANGE IN INITIAL FORCES OF INFERIOR LEADS Confirmed by Huel Coventry [phone]) on 12/19/2022 6:51:40 AM    XR Chest 1 view Portable    Result Date: 12/18/2022  EXAM: XR CHEST PORTABLE DATE: 12/18/2022 7:25 PM ACCESSION: [id number] UN DICTATED: 12/18/2022 7:26 PM INTERPRETATION LOCATION: MAIN CAMPUS CLINICAL INDICATION: 67 years old Female with ALTERED MENTAL STATUS  TECHNIQUE: Single frontal view of the chest. COMPARISON: 07/15/2022. FINDINGS: Lung: There are low lung volumes with a prominence of the interstitium. Pleura: No pleural effusion or pneumothorax identified. Mediastinum: The cardiomediastinal silhouette appears similar to the prior exam. Bones: No evidence of acute osseous abnormality is identified.     Low lung volumes with a prominence of the interstitium which could be technical in nature or related to viral infection.     CT head WO contrast    Result Date: 12/18/2022  EXAM: Computed tomography, head or brain without contrast material. DATE: 12/18/2022 7:07 PM ACCESSION: [id number] UN DICTATED: 12/18/2022 7:10 PM INTERPRETATION LOCATION: [facility] CLINICAL INDICATION: 68 years old Female with AMS  COMPARISON: 09/17/2022. TECHNIQUE: Axial CT images of the head  from skull base to vertex without contrast. FINDINGS: The gray-white matter differentiation is intact. There is no evidence of acute infarct, hemorrhage, mass or mass effect. There is a prominence of the ventricles and sulci consistent with diffuse cortical volume loss. There are periventricular low-attenuation white matter changes, likely small vessel ischemic disease. The visualized paranasal sinuses are clear. The mastoid air cells are aerated. There is calcified atherosclerotic disease of the cavernous carotid arteries. No acute calvarial fracture is identified.    No evidence of acute intracranial pathology.    ______________________________________________________________________  Discharge Instructions:           Other Instructions  Discharge instructions      It was a pleasure taking care of you!    You were admitted to [facility] for dizziness and confusion. We think this is most likely due to too many medications.     The following medication changes were made during your hospitalization:  - Stop taking your metoprolol  - Stop taking Lyrica and Tizanadine  - Continue taking your other medications as prescribed    You should follow-up with the following physicians:  - Your primary care doctor. Please call them to set up an appointment in the next week.     Please carefully read and follow these instructions below upon your discharge:    1) Please take your medications as prescribed and note the changes listed on your discharge. At future follow-up appointments, please be sure to take all of your medications with you so your provider can better guide your care.     2) Seek medical care with your primary care doctor or local Emergency Room or Urgent Care if you develop any changes in your mental status, worsening abdominal pain, fevers greater than 101.5, any unexplained/unrelieved shortness of breath, uncontrolled nausea and vomiting that keeps you from remaining hydrated or taking your medication, or any other concerning symptoms.     3) Please go to your follow-up appointments. Some of your follow-up appointments have been listed below. If you do not see an appointment listed below with your primary care doctor, please call your doctor's office as soon as possible to schedule an appointment to be seen within 7-10 days of discharge.     4) If you have any concerns before you are able to follow-up with your primary care doctor, you can reach [place] by calling [phone] and asking to page the Hospitalist on call.    We wish you all the best!            Follow Up instructions and Outpatient Referrals     Ambulatory referral to Home Health      Is this a [facility] or [facility] Patient?: No    Physician to follow patient's care: Referring Provider    Disciplines requested: Physical Therapy    Physical Therapy requested: Evaluate and treat    Requested SOC Date:  Comment - 48 hrs after discharge    Do you want ongoing co-management?: No    Care coordination required?: No    Discharge instructions          Appointments which have been scheduled for you      Jan 22, 2023  9:00 AM  (Arrive by 8:35 AM)  NEW  SLEEP with Patrcia Dolly, PA  [facility] [address] [place] [facility]) [address]  [address]  [place] [address]  [phone]             ______________________________________________________________________  Discharge Day Services:  BP 162/76  - Pulse 77  - Temp 37.1 ??C (98.7 ??F) (Temporal)  - Resp 18  - Ht 170.2 cm (5' 7)  - Wt (!) 115 kg (253 lb 9.6 oz)  - SpO2 97%  - BMI 39.72 kg/m??   Pt seen on the day of discharge and determined appropriate for discharge.    Condition at Discharge: fair    Length of Discharge: I spent greater than 30 mins in the discharge of this patient.

## 2022-12-21 NOTE — Unmapped (Signed)
Patient is alert and oriented, VSS, and 2L [place]. Tele sitter in place. Patient complained of pain that was not responding to Tylenol, provider notified, see new orders. No falls this shift, standard precautions maintained, bed in low position with locked wheels, and call bell within reach  Problem: Adult Inpatient Plan of Care  Goal: Plan of Care Review  Outcome: Ongoing - Unchanged  Goal: Patient-Specific Goal (Individualized)  Outcome: Ongoing - Unchanged  Goal: Absence of Hospital-Acquired Illness or Injury  Outcome: Ongoing - Unchanged  Intervention: Prevent Skin Injury  Recent Flowsheet Documentation  Taken 12/20/2022 1900 by Melony Overly, RN  Skin Protection: incontinence pads utilized  Goal: Optimal Comfort and Wellbeing  Outcome: Ongoing - Unchanged  Goal: Readiness for Transition of Care  Outcome: Ongoing - Unchanged  Goal: Rounds/Family Conference  Outcome: Ongoing - Unchanged     Problem: Self-Care Deficit  Goal: Improved Ability to Complete Activities of Daily Living  Outcome: Ongoing - Unchanged     Problem: Comorbidity Management  Goal: Maintenance of COPD Symptom Control  Outcome: Ongoing - Unchanged  Goal: Blood Glucose Levels Within Targeted Range  Outcome: Ongoing - Unchanged

## 2022-12-31 NOTE — Unmapped (Signed)
Clinician is unable to reach patient for PT soc. Patient  placed back into scheduling queue.

## 2023-01-02 ENCOUNTER — Other Ambulatory Visit: Payer: Self-pay | Admitting: Gerontology

## 2023-01-02 DIAGNOSIS — Z0000 Encounter for general adult medical examination without abnormal findings: Secondary | ICD-10-CM

## 2023-01-03 MED ORDER — EVOLOCUMAB 140 MG/ML SUBCUTANEOUS SYRINGE
3 refills | 0 days
Start: 2023-01-03 — End: ?

## 2023-01-03 MED ORDER — REPATHA SYRINGE 140 MG/ML SUBCUTANEOUS SYRINGE
SUBCUTANEOUS | 3 refills | 0 days
Start: 2023-01-03 — End: ?

## 2023-01-08 NOTE — Unmapped (Signed)
[facility] Refill Coordination Note    Specialty Lite Medication(s) to be Shipped:   General Specialty: Repatha    Other medication(s) to be shipped: No additional medications requested for fill at this time     Marie Quinn, DOB: [birth year]  Phone: [phone] (home)       All above HIPAA information was verified with patient.     Was a Nurse, learning disability used for this call? No    Changes to medications: Sinay reports no changes at this time.  Changes to insurance: No      REFERRAL TO PHARMACIST     Referral to the pharmacist: Not needed      [facility]     Shipping address confirmed in Epic.     Delivery Scheduled: Yes, Expected medication delivery date: 01/17/23.     Medication will be delivered via Same Day Courier to the prescription address in Epic WAM.    Moshe Salisbury   [facility] Specialty Technician

## 2023-01-09 ENCOUNTER — Ambulatory Visit
Admission: RE | Admit: 2023-01-09 | Discharge: 2023-01-09 | Disposition: A | Discharge status: Home / Self Care | Payer: 59 | Source: Ambulatory Visit | Attending: Gerontology | Admitting: Gerontology

## 2023-01-09 ENCOUNTER — Ambulatory Visit: Payer: 59

## 2023-01-09 DIAGNOSIS — I700 Atherosclerosis of aorta: Secondary | ICD-10-CM | POA: Diagnosis not present

## 2023-01-09 DIAGNOSIS — Z0000 Encounter for general adult medical examination without abnormal findings: Secondary | ICD-10-CM | POA: Insufficient documentation

## 2023-01-09 DIAGNOSIS — R918 Other nonspecific abnormal finding of lung field: Secondary | ICD-10-CM | POA: Insufficient documentation

## 2023-01-16 DIAGNOSIS — I2510 Atherosclerotic heart disease of native coronary artery without angina pectoris: Principal | ICD-10-CM

## 2023-01-16 DIAGNOSIS — E785 Hyperlipidemia, unspecified: Principal | ICD-10-CM

## 2023-01-17 MED FILL — REPATHA SYRINGE 140 MG/ML SUBCUTANEOUS SYRINGE: SUBCUTANEOUS | 28 days supply | Qty: 2 | Fill #0

## 2023-02-10 NOTE — Unmapped (Signed)
The [facility] has made a second and final attempt to reach this patient to refill the following medication:Repatha.      We have been unable to leave messages on the following phone numbers: [phone] and have sent a Mychart questionnaire..    Dates contacted: 02/07/23, 02/10/23  Last scheduled delivery: 01/16/23    The patient may be at risk of non-compliance with this medication. The patient should call the [facility] at [phone]  Option 4, then Option 5: Cardiology, Endocrinology to refill medication.    Moshe Salisbury   [facility] Specialty Technician

## 2023-02-11 NOTE — Unmapped (Signed)
[facility] Refill Coordination Note    Specialty Lite Medication(s) to be Shipped:   General Specialty: Repatha    Other medication(s) to be shipped: No additional medications requested for fill at this time     Marie Quinn, DOB: 1956-05-07  Phone: [phone] (home)       All above HIPAA information was verified with patient.     Was a Nurse, learning disability used for this call? No    Changes to medications: Secily reports no changes at this time.  Changes to insurance: No      REFERRAL TO PHARMACIST     Referral to the pharmacist: Not needed      [facility]     Shipping address confirmed in Epic.     Delivery Scheduled: Yes, Expected medication delivery date: 02/13/23.     Medication will be delivered via Same Day Courier to the prescription address in Epic WAM.    [facility] Bogdon' [facility] Specialty Technician

## 2023-02-13 MED FILL — REPATHA SYRINGE 140 MG/ML SUBCUTANEOUS SYRINGE: SUBCUTANEOUS | 28 days supply | Qty: 2 | Fill #1

## 2023-03-12 NOTE — Unmapped (Signed)
[facility] Refill Coordination Note    Specialty Lite Medication(s) to be Shipped:   General Specialty: Repatha    Other medication(s) to be shipped: No additional medications requested for fill at this time     Marie Quinn, DOB: August 19, 1956  Phone: [phone] (home)       All above HIPAA information was verified with patient.     Was a Nurse, learning disability used for this call? No    Changes to medications: [name] reports no changes at this time.  Changes to insurance: No      REFERRAL TO PHARMACIST     Referral to the pharmacist: Not needed      [facility]     Shipping address confirmed in Epic.     Delivery Scheduled: Yes, Expected medication delivery date: 03/17/23.     Medication will be delivered via Same Day Courier to the prescription address in Epic WAM.    Moshe Salisbury   [facility] Specialty Technician

## 2023-03-17 MED FILL — REPATHA SYRINGE 140 MG/ML SUBCUTANEOUS SYRINGE: SUBCUTANEOUS | 28 days supply | Qty: 2 | Fill #2

## 2023-04-15 NOTE — Unmapped (Signed)
[facility] Refill Coordination Note    Specialty Medication(s) to be Shipped:   General Specialty: Repatha    Other medication(s) to be shipped: No additional medications requested for fill at this time     Marie Quinn, DOB: January 10, 1956  Phone: [phone] (home)       All above HIPAA information was verified with patient.     Was a Nurse, learning disability used for this call? No    Completed refill call assessment today to schedule patient's medication shipment from the [facility] [phone]).  All relevant notes have been reviewed.     Specialty medication(s) and dose(s) confirmed: Regimen is correct and unchanged.   Changes to medications: Cheresse reports no changes at this time.  Changes to insurance: No  New side effects reported not previously addressed with a pharmacist or physician: None reported  Questions for the pharmacist: No    Confirmed patient received a Conservation officer, historic buildings and a Surveyor, mining with first shipment. The patient will receive a drug information handout for each medication shipped and additional FDA Medication Guides as required.       DISEASE/MEDICATION-SPECIFIC INFORMATION        For patients on injectable medications: Patient currently has 1 doses left.  Next injection is scheduled for 04/20/23.    SPECIALTY MEDICATION ADHERENCE     Medication Adherence    Patient reported X missed doses in the last month: 1  Specialty Medication: REPATHA SYRINGE 140 mg/mL Syrg (evolocumab)  Patient is on additional specialty medications: No  Patient is on more than two specialty medications: No  Any gaps in refill history greater than 2 weeks in the last 3 months: no  Demonstrates understanding of importance of adherence: yes              Were doses missed due to medication being on hold? No    REPATHA SYRINGE 140   mg/ml: 14 days of medicine on hand       REFERRAL TO PHARMACIST     Referral to the pharmacist: Not needed      [facility]     Shipping address confirmed in Epic.       Delivery Scheduled: Yes, Expected medication delivery date: 04/22/23.     Medication will be delivered via Same Day Courier to the prescription address in Epic WAM.    Moshe Salisbury   [facility] Specialty Technician

## 2023-04-22 MED FILL — REPATHA SYRINGE 140 MG/ML SUBCUTANEOUS SYRINGE: SUBCUTANEOUS | 28 days supply | Qty: 2 | Fill #3

## 2023-05-19 NOTE — Unmapped (Signed)
The [facility] has made a second and final attempt to reach this patient to refill the following medication:Repatha.      We have been unable to leave messages on the following phone numbers: [phone] and have sent a text message to the following phone numbers: [phone] .    Dates contacted: 05/13/23, 05/19/23  Last scheduled delivery: 04/21/23    The patient may be at risk of non-compliance with this medication. The patient should call the [facility] at [phone]  Option 4, then Option 5: Cardiology, Endocrinology to refill medication.    Moshe Salisbury   [facility] Specialty Technician

## 2023-07-30 ENCOUNTER — Other Ambulatory Visit: Payer: Self-pay | Admitting: Specialist

## 2023-07-30 DIAGNOSIS — K862 Cyst of pancreas: Secondary | ICD-10-CM

## 2023-08-06 ENCOUNTER — Ambulatory Visit: Admission: RE | Admit: 2023-08-06 | Payer: 59 | Source: Ambulatory Visit

## 2023-08-09 ENCOUNTER — Ambulatory Visit
Admission: RE | Admit: 2023-08-09 | Discharge: 2023-08-09 | Disposition: A | Discharge status: Home / Self Care | Payer: 59 | Source: Ambulatory Visit | Attending: Specialist | Admitting: Specialist

## 2023-08-09 DIAGNOSIS — K862 Cyst of pancreas: Secondary | ICD-10-CM | POA: Insufficient documentation

## 2023-08-09 MED ORDER — GADOBUTROL 1 MMOL/ML IV SOLN
10.0000 mL | Freq: Once | INTRAVENOUS | Status: AC | PRN
  Administered 2023-08-09: 10 mL via INTRAVENOUS

## 2023-08-15 NOTE — Unmapped (Signed)
[facility] Refill Coordination Note    Specialty Lite Medication(s) to be Shipped:   Repatha    Other medication(s) to be shipped: No additional medications requested for fill at this time     Marie Quinn, DOB: 10-Nov-1955  Phone: [phone] (home)       All above HIPAA information was verified with patient.     Was a Nurse, learning disability used for this call? No    Changes to medications: [facility] reports no changes at this time.  Changes to insurance: No      REFERRAL TO PHARMACIST     Referral to the pharmacist: Not needed      [facility]     Shipping address confirmed in Epic.     Delivery Scheduled: Yes, Expected medication delivery date: 08/20/23.     Medication will be delivered via Same Day Courier to the prescription address in Epic WAM.    Camillo Flaming, PharmD   [facility] Specialty Pharmacist

## 2023-08-20 MED FILL — REPATHA SYRINGE 140 MG/ML SUBCUTANEOUS SYRINGE: SUBCUTANEOUS | 84 days supply | Qty: 6 | Fill #4

## 2023-09-03 ENCOUNTER — Telehealth: Payer: Self-pay

## 2023-09-03 NOTE — Telephone Encounter (Signed)
The patient called in wanting to schedule an office visit. She said that her provider sent here referral last week. I informed her we don't have a referral and from looking at the notes she needs to call [facility] to schedule an appointment.

## 2023-09-07 ENCOUNTER — Encounter: Payer: Self-pay | Admitting: Emergency Medicine

## 2023-09-07 ENCOUNTER — Ambulatory Visit
Admission: EM | Admit: 2023-09-07 | Discharge: 2023-09-07 | Disposition: A | Discharge status: Home / Self Care | Payer: 59 | Attending: Family Medicine | Admitting: Family Medicine

## 2023-09-07 DIAGNOSIS — Z87891 Personal history of nicotine dependence: Secondary | ICD-10-CM | POA: Insufficient documentation

## 2023-09-07 DIAGNOSIS — Z8701 Personal history of pneumonia (recurrent): Secondary | ICD-10-CM | POA: Insufficient documentation

## 2023-09-07 DIAGNOSIS — Z79899 Other long term (current) drug therapy: Secondary | ICD-10-CM | POA: Insufficient documentation

## 2023-09-07 DIAGNOSIS — R509 Fever, unspecified: Secondary | ICD-10-CM | POA: Diagnosis present

## 2023-09-07 DIAGNOSIS — J471 Bronchiectasis with (acute) exacerbation: Secondary | ICD-10-CM | POA: Diagnosis not present

## 2023-09-07 DIAGNOSIS — Z9981 Dependence on supplemental oxygen: Secondary | ICD-10-CM | POA: Diagnosis not present

## 2023-09-07 DIAGNOSIS — Z1152 Encounter for screening for COVID-19: Secondary | ICD-10-CM | POA: Insufficient documentation

## 2023-09-07 DIAGNOSIS — Z20822 Contact with and (suspected) exposure to covid-19: Secondary | ICD-10-CM | POA: Diagnosis present

## 2023-09-07 DIAGNOSIS — R059 Cough, unspecified: Secondary | ICD-10-CM | POA: Diagnosis present

## 2023-09-07 LAB — GROUP A STREP BY PCR: Group A Strep by PCR: NOT DETECTED

## 2023-09-07 LAB — RESP PANEL BY RT-PCR (FLU A&B, COVID) ARPGX2
Influenza A by PCR: NEGATIVE
Influenza B by PCR: NEGATIVE
SARS Coronavirus 2 by RT PCR: NEGATIVE

## 2023-09-07 MED ORDER — PREDNISONE 50 MG PO TABS: 50.0000 mg | ORAL_TABLET | Freq: Every day | ORAL | 0 refills | Status: AC

## 2023-09-07 MED ORDER — LEVOFLOXACIN 750 MG PO TABS: 750.0000 mg | ORAL_TABLET | Freq: Every day | ORAL | 0 refills | Status: AC

## 2023-09-07 NOTE — ED Provider Notes (Signed)
[facility]    CSN: [id number] Arrival date & time: 09/07/23  1450      History   Chief Complaint Chief Complaint  Patient presents with   Cough   Sore Throat   Fever    HPI 67 year old female presents for evaluation of the above.  Patient is followed by pulmonology.  Patient is on oxygen therapy.  Per CT findings she has known bronchiectasis.  Patient reports productive cough, sore throat, and subjective fever.  She states that it started on Friday.  She states that she has a history of pneumonia and is quite concerned about this.  No relieving factors.  She is not currently wearing her oxygen.   Past Medical History:  Diagnosis Date   Anxiety    Asthma    CAD (coronary artery disease)    Depression    Diabetes mellitus without complication (HCC)    Type II   DOE (dyspnea on exertion)    Fibromyalgia    GERD (gastroesophageal reflux disease)    Grade II diastolic dysfunction    History of kidney stones    passed 3 kidney stones this week (02/02/21)   Hyperlipidemia    Hypertension    Myocardial infarction (HCC) 2005   1 stent   On supplemental oxygen therapy    Pancreatitis 2020   PTSD (post-traumatic stress disorder)    Sleep apnea    no cpap. on back order x 2.5 years!! uses nasal prongs at night    Patient Active Problem List   Diagnosis Date Noted   Urge incontinence 08/13/2018   Anxiety 07/30/2018   Asthma 07/30/2018   Depression 07/30/2018   Fibromyalgia 07/30/2018   Heart disease 07/30/2018   Panic disorder 07/30/2018   Post traumatic stress disorder (PTSD) 07/30/2018   Hypertension 07/30/2018   Chest pain 08/07/2012   Myocardial infarction (HCC) 10/14/2006    Past Surgical History:  Procedure Laterality Date   ABDOMINAL HYSTERECTOMY     partial   ACHILLES TENDON SURGERY Right 02/08/2021   Procedure: ACHILLES TENDON REPAIR, SECONDARY- RIGHT;  Surgeon: Rosetta Posner, DPM;  Location: [facility];  Service: Podiatry;  Laterality:  Right;   CARDIAC CATHETERIZATION  2005   1 stent   CARDIAC SURGERY  2005   stent x 1   CHOLECYSTECTOMY  2016   GASTROC RECESSION EXTREMITY Right 02/08/2021   Procedure: GASTROC RECESSION- RIGHT;  Surgeon: Rosetta Posner, DPM;  Location: [facility];  Service: Podiatry;  Laterality: Right;   MOUTH SURGERY  2003   teeth removed in their entirety   OSTECTOMY Right 02/08/2021   Procedure: OSTECTOMY- HAGLUNDS/RETROCALCANEAL;  Surgeon: Rosetta Posner, DPM;  Location: [facility] OR[facility];  Service: Podiatry;  Laterality: Right;   pancreas stent  06/2019   TENDON TRANSFER Right 02/08/2021   Procedure: FHL TRANSFER; DEEP- RIGHT;  Surgeon: Rosetta Posner, DPM;  Location: [facility] OR[facility];  Service: Podiatry;  Laterality: Right;   TONSILLECTOMY  1983    OB History   No obstetric history on file.      Home Medications    Prior to Admission medications   Medication Sig Start Date End Date Taking? Authorizing Provider  levofloxacin (LEVAQUIN) 750 MG tablet Take 1 tablet (750 mg total) by mouth daily. 09/07/23  Yes Andrew Soria G, DO  predniSONE (DELTASONE) 50 MG tablet Take 1 tablet (50 mg total) by mouth daily for 5 days. 09/07/23 09/12/23 Yes Liset Mcmonigle G, DO  acetaminophen (TYLENOL) 500 MG tablet Take 1,000 mg by mouth every 6 (six)  hours as needed.    [provider]  albuterol (PROVENTIL) (2.5 MG/3ML) 0.083% nebulizer solution Take 2.5 mg by nebulization every 6 (six) hours as needed for wheezing or shortness of breath.    [provider]  albuterol (VENTOLIN HFA) 108 (90 Base) MCG/ACT inhaler Inhale 1-2 puffs into the lungs every 6 (six) hours as needed for wheezing or shortness of breath.    [provider]  amLODipine (NORVASC) 10 MG tablet Take 10 mg by mouth daily. 11/16/20   [provider]  ARIPiprazole (ABILIFY) 2 MG tablet Take 4 mg by mouth daily. 01/11/21   [provider]  aspirin 81 MG EC tablet Take 1 tablet (81 mg total) by mouth daily. 02/09/21   Rosetta Posner,  DPM  atorvastatin (LIPITOR) 40 MG tablet     [provider]  buPROPion (WELLBUTRIN SR) 200 MG 12 hr tablet Take 200 mg by mouth 2 (two) times daily. 10/25/20   [provider]  Cholecalciferol 50 MCG (2000 UT) CAPS Take 6,000 Units by mouth daily. 05/26/20   [provider]  Cinnamon 500 MG capsule Take 500 mg by mouth daily.    [provider]  citalopram (CELEXA) 40 MG tablet     [provider]  DULoxetine (CYMBALTA) 60 MG capsule Take 60 mg by mouth daily. 01/11/21   [provider]  Echinacea 400 MG CAPS Take by mouth.    [provider]  enoxaparin (LOVENOX) 40 MG/0.4ML injection Inject 0.4 mLs (40 mg total) into the skin daily. 02/09/21   Rosetta Posner, DPM  fexofenadine (ALLEGRA) 180 MG tablet Take 180 mg by mouth daily. 11/14/20 12/12/22  [provider]  glimepiride (AMARYL) 1 MG tablet Take 1 mg by mouth daily before breakfast. 01/11/21   [provider]  Glucosamine-Chondroitin (GLUCOSAMINE CHONDR COMPLEX PO) Take 1,200 mg by mouth.    [provider]  hydrOXYzine (ATARAX/VISTARIL) 25 MG tablet Take 25 mg by mouth every 6 (six) hours as needed for anxiety. 07/30/18   [provider]  lamoTRIgine (LAMICTAL) 100 MG tablet Take 100 mg by mouth 2 (two) times daily.    [provider]  lamoTRIgine (LAMICTAL) 25 MG tablet Take 50 mg by mouth daily.    [provider]  losartan (COZAAR) 100 MG tablet Take 100 mg by mouth daily. 12/21/20   [provider]  meclizine (ANTIVERT) 25 MG tablet Take 1 tablet (25 mg total) by mouth 3 (three) times daily as needed for dizziness or nausea. 08/14/21   Irean Hong, MD  metoprolol succinate (TOPROL-XL) 100 MG 24 hr tablet Take 100 mg by mouth daily.    [provider]  montelukast (SINGULAIR) 10 MG tablet Take 10 mg by mouth at bedtime. 01/11/21   [provider]  nitroGLYCERIN (NITROSTAT) 0.4 MG SL tablet Place under the  tongue. 04/03/22 04/03/23  [provider]  omeprazole (PRILOSEC) 20 MG capsule Take 20 mg by mouth 2 (two) times daily before a meal. 07/30/18   [provider]  ondansetron (ZOFRAN ODT) 4 MG disintegrating tablet Take 1 tablet (4 mg total) by mouth every 8 (eight) hours as needed for nausea or vomiting. 08/14/21   Irean Hong, MD  ondansetron (ZOFRAN) 4 MG tablet Take 1 tablet by mouth every 8 (eight) hours as needed. 07/31/21   [provider]  orphenadrine (NORFLEX) 100 MG tablet Take 1 tablet (100 mg total) by mouth 2 (two) times daily as needed for muscle spasms.  Patient not taking: Reported on 01/18/2021 08/16/18   Sudie Grumbling, NP  oxybutynin (DITROPAN) 5 MG tablet Take 5 mg by mouth daily.    [provider]  OXYGEN Inhale into the lungs. 2 liters by nasal canula prn    [provider]  potassium chloride (KLOR-CON) 10 MEQ tablet Take 10 mEq by mouth daily. 01/11/21   [provider]  pregabalin (LYRICA) 75 MG capsule Take 75 mg by mouth in the morning, at noon, in the evening, and at bedtime. 01/11/21   [provider]  REPATHA 140 MG/ML SOSY Inject 140 mg into the skin every 14 (fourteen) days. 12/19/20   [provider]  RYBELSUS 3 MG TABS Take 1 tablet by mouth every morning.    [provider]  SYMBICORT 160-4.5 MCG/ACT inhaler Inhale 2 puffs into the lungs in the morning and at bedtime. 01/11/21   [provider]  tiZANidine (ZANAFLEX) 4 MG tablet Take 4 mg by mouth 3 (three) times daily.    [provider]  torsemide (DEMADEX) 10 MG tablet Take 10 mg by mouth daily. 01/11/21   [provider]  traMADol (ULTRAM) 50 MG tablet TAKE 1 TABLET BY MOUTH BY MOUTH EVERY 6 HOURS    [provider]  zinc gluconate 50 MG tablet Take 100 mg by mouth daily.    [provider]    Family History Family History  Problem Relation Age of Onset   Diabetes Mother    Hypertension  Mother    Heart disease Father     Social History Social History   Tobacco Use   Smoking status: Former    Current packs/day: 0.00    Types: Cigarettes    Quit date: 07/30/2020    Years since quitting: 3.1   Smokeless tobacco: Never  Vaping Use   Vaping status: Former   Quit date: 01/28/2018  Substance Use Topics   Alcohol use: Yes    Comment: occasionally   Drug use: Never     Allergies   Iodinated contrast media, Percocet [oxycodone-acetaminophen], Statins, Sulfa antibiotics, and Tape   Review of Systems Review of Systems Per HPI  Physical Exam Triage Vital Signs ED Triage Vitals  Encounter Vitals Group     BP 09/07/23 1521 123/78     Systolic BP Percentile --      Diastolic BP Percentile --      Pulse Rate 09/07/23 1521 82     Resp 09/07/23 1521 15     Temp 09/07/23 1521 98 F (36.7 C)     Temp Source 09/07/23 1521 Oral     SpO2 09/07/23 1521 95 %     Weight 09/07/23 1519 246 lb 14.6 oz (112 kg)     Height 09/07/23 1519 5\' 7"  (1.702 m)     Head Circumference --      Peak Flow --      Pain Score 09/07/23 1519 7     Pain Loc --      Pain Education --      Exclude from Growth Chart --    No data found.  Updated Vital Signs BP 123/78 (BP Location: Right Arm)   Pulse 82   Temp 98 F (36.7 C) (Oral)   Resp 15   Ht 5\' 7"  (1.702 m)   Wt 112 kg   SpO2 95%   BMI 38.67 kg/m   Visual Acuity Right Eye Distance:   Left Eye Distance:   Bilateral Distance:  Right Eye Near:   Left Eye Near:    Bilateral Near:     Physical Exam Vitals and nursing note reviewed.  Constitutional:      General: She is not in acute distress.    Appearance: She is obese.  HENT:     Head: Normocephalic and atraumatic.     Mouth/Throat:     Pharynx: Oropharynx is clear.  Cardiovascular:     Rate and Rhythm: Normal rate and regular rhythm.  Pulmonary:     Effort: Pulmonary effort is normal.     Comments: Faint crackles. Neurological:     Mental Status: She is  alert.      UC Treatments / Results  Labs (all labs ordered are listed, but only abnormal results are displayed) Labs Reviewed  GROUP A STREP BY PCR  RESP PANEL BY RT-PCR (FLU A&B, COVID) [id number]    EKG   Radiology No results found.  Procedures Procedures (including critical care time)  Medications Ordered in UC Medications - No data to display  Initial Impression / Assessment and Plan / UC Course  I have reviewed the triage vital signs and the nursing notes.  Pertinent labs & imaging results that were available during my care of the patient were reviewed by me and considered in my medical decision making (see chart for details).    68 year old female with a history of bronchiectasis presents with productive cough.  Treating for exacerbation of bronchiectasis.  Levaquin and prednisone as prescribed.  Advise follow-up with pulmonology.  Final Clinical Impressions(s) / UC Diagnoses   Final diagnoses:  Bronchiectasis with acute exacerbation [facility])     Discharge Instructions      Medications as prescribed.  Follow up with Pulmonology.    ED Prescriptions     Medication Sig Dispense Auth. Provider   levofloxacin (LEVAQUIN) 750 MG tablet Take 1 tablet (750 mg total) by mouth daily. 7 tablet Brondon Wann G, DO   predniSONE (DELTASONE) 50 MG tablet Take 1 tablet (50 mg total) by mouth daily for 5 days. 5 tablet Tommie Sams, DO      PDMP not reviewed this encounter.   Tommie Sams, [place] 09/07/23 1642

## 2023-09-07 NOTE — ED Triage Notes (Signed)
Patient c/o cough, congestion, fever, and sore throat that started on Friday.  Patient has history of Bronchitis and Pneumonia.

## 2023-09-07 NOTE — Discharge Instructions (Signed)
Medications as prescribed.  Follow up with Pulmonology.

## 2023-09-17 ENCOUNTER — Ambulatory Visit
Admit: 2023-09-17 | Discharge: 2023-09-18 | Disposition: A | Discharge status: Home / Self Care | Payer: MEDICARE | Attending: Family

## 2023-09-17 ENCOUNTER — Emergency Department
Admit: 2023-09-17 | Discharge: 2023-09-18 | Disposition: A | Discharge status: Home / Self Care | Payer: MEDICARE | Attending: Family

## 2023-09-17 DIAGNOSIS — W19XXXD Unspecified fall, subsequent encounter: Principal | ICD-10-CM

## 2023-09-17 MED ORDER — ONDANSETRON 4 MG DISINTEGRATING TABLET
ORAL_TABLET | Freq: Three times a day (TID) | ORAL | 0 refills | 5 days | Status: CP | PRN
Start: 2023-09-17 — End: 2023-09-24

## 2023-09-18 MED ADMIN — ondansetron (ZOFRAN-ODT) disintegrating tablet 4 mg: 4 mg | ORAL | @ 02:00:00 | Stop: 2023-09-17

## 2023-09-18 NOTE — Unmapped (Shared)
[facility]  Emergency Department Provider Note     ED Clinical Impression     Final diagnoses:   Fall, subsequent encounter (Primary)      Impression, Medical Decision Making, ED Course     Impression: 67 y.o. female who has a past medical history of Achilles tendinitis, right leg (2021), Anxiety, Arthritis, Asthma, BPPV (benign paroxysmal positional vertigo), Chronic diastolic CHF (congestive heart failure) (CMS-HCC), COPD (chronic obstructive pulmonary disease) (CMS-HCC), Depression, Fibromyalgia, GERD (gastroesophageal reflux disease), Heart disease, Heart murmur, Hyperlipidemia, Hypertension, Myocardial infarction (CMS-HCC) (2008), Obesity, Panic disorder, Substance abuse (CMS-HCC), Type 2 diabetes mellitus, without long-term current use of insulin (CMS-HCC) (12/19/2022), and Urge incontinence. who presents with *** as described below. ***    DDx/MDM: ***    Diagnostic workup as below. Will treat patient with ***.    Orders Placed This Encounter   Procedures    CT Head Wo Contrast            MDM Elements  {Discussion of Management with other Physicians, QHP or Appropriate Source:94488}  {Independent Interpretation of Studies:94489}  {I have reviewed recent and relevant previous record, including:94490}  {Escalation of Care including OBS/Admission/Transfer was considered:94491}  {Social Determinants that significantly affected care:94492}  {Prescription drugs considered but not prescribed:94493}  {Diagnostic tests considered but not performed:94494}  ____________________________________________    The case was discussed with the attending physician, who is in agreement with the above assessment and plan.      History     Chief Complaint  Chief Complaint   Patient presents with    Closed Head Injury Without LOC       HPI   Marie Quinn is a 67 y.o. female with past medical history as below who presents with ***    Outside Historian(s): I have obtained additional history/collateral from ***.    Past Medical History:   Diagnosis Date    Achilles tendinitis, right leg 2021    Anxiety     Arthritis     Adult life    Asthma     BPPV (benign paroxysmal positional vertigo)     Chronic diastolic CHF (congestive heart failure) (CMS-HCC)     COPD (chronic obstructive pulmonary disease) (CMS-HCC)     Depression     Fibromyalgia     GERD (gastroesophageal reflux disease)     Adult life    Heart disease     Heart murmur     Hyperlipidemia     Hypertension     Myocardial infarction (CMS-HCC) 2008    s/p stent placement x 1 at [facility]    Obesity     Panic disorder     Substance abuse (CMS-HCC)     Type 2 diabetes mellitus, without long-term current use of insulin (CMS-HCC) 12/19/2022    Urge incontinence        Past Surgical History:   Procedure Laterality Date    cardiac stent x 1      CHOLECYSTECTOMY      HYSTERECTOMY  1990    one ovary remains    PR COLORECTAL SCRN; HI RISK IND N/A 08/06/2019    Procedure: COLOREC CANCR SCR; COLNSCPY;  Surgeon: Charm Rings, MD;  [facility];  Service: Gastroenterology    PR ERCP REMOVE FOREIGN BODY/STENT BILIARY/PANC DUCT N/A 08/09/2019    Procedure: ENDOSCOPIC RETROGRADE CHOLANGIOPANCREATOGRAPHY (ERCP); W/ REMOVAL OF FOREIGN BODY/STENT FROM BILIARY/PANCREATIC DUCT(S);  Surgeon: Vonda Antigua, MD;  [facility]: [facility];  Service: Gastroenterology    PR ERCP STENT PLACEMENT BILIARY/PANCREATIC DUCT N/A 06/28/2019    Procedure: ENDOSCOPIC RETROGRADE CHOLANGIOPANCREATOGRAPHY (ERCP); WITH PLACEMENT OF ENDOSCOPIC STENT INTO BILIARY OR PANCREATIC DUCT;  Surgeon: Vonda Antigua, MD;  Location: [facility];  Service: Gastroenterology    PR ERCP,SPHINCTEROTOMY N/A 11/12/2018    Procedure: ERCP; W/SPHINCTEROTOMY/PAPILLOTOMY;  Surgeon: Vonda Antigua, MD;  [facility]: [facility];  Service: Gastroenterology    PR ERCP,W/REMOVAL STONE,BIL/PANCR DUCTS N/A 11/12/2018    Procedure: ERCP; W/ENDOSCOPIC RETROGRADE REMOVAL OF CALCULUS/CALCULI FROM BILIARY &/OR PANCREATIC DUCTS;  Surgeon: Vonda Antigua, MD;  Location: [facility];  Service: Gastroenterology    TONSILLECTOMY      TUBAL LIGATION  1990    Tubal pregnacy         Current Facility-Administered Medications:     ondansetron (ZOFRAN-ODT) disintegrating tablet 4 mg, 4 mg, Oral, Once, Makalynn Berwanger, Mila Homer, FNP    Current Outpatient Medications:     albuterol 2.5 mg /3 mL (0.083 %) nebulizer solution, Inhale the contents of 1 vial (2.5 mg total) by nebulization every four (4) hours as needed for wheezing., Disp: 90 mL, Rfl: 1    amlodipine (NORVASC) 10 MG tablet, Take 1 tablet (10 mg total) by mouth daily., Disp: 90 tablet, Rfl: 2    aspirin (ECOTRIN) 81 MG tablet, Take 1 tablet (81 mg total) by mouth daily., Disp: , Rfl:     buPROPion (WELLBUTRIN XL) 300 MG 24 hr tablet, Take 1 tablet (300 mg total) by mouth daily., Disp: , Rfl:     cholecalciferol, vitamin D3-50 mcg, 2,000 unit,, (VITAMIN D3) 50 mcg (2,000 unit) cap, Take 3 capsules (150 mcg total) by mouth daily., Disp: , Rfl:     DULoxetine (CYMBALTA) 20 MG capsule, Take 1 capsule (20 mg total) by mouth daily., Disp: 30 capsule, Rfl: 2    evolocumab (REPATHA SYRINGE) 140 mg/mL Syrg, Inject the contents of 1 syringe (140 mg) under the skin every fourteen (14) days., Disp: 6 mL, Rfl: 3    hydrOXYzine (ATARAX) 25 MG tablet, Take 1 Tablet by mouth every 6 hours as needed for anxiety and congestion, Disp: 30 tablet, Rfl: 0    lamoTRIgine (LAMICTAL) 100 MG tablet, Take 125 mg by mouth two (2) times a day., Disp: , Rfl:     lamoTRIgine (LAMICTAL) 25 MG tablet, Take 1 tablet (25 mg total) by mouth two (2) times a day., Disp: , Rfl:     losartan (COZAAR) 100 MG tablet, Take 1 tablet (100 mg total) by mouth daily., Disp: 90 tablet, Rfl: 1    meclizine (ANTIVERT) 25 mg tablet, Take 1 tablet (25 mg total) by mouth Three (3) times a day as needed for dizziness., Disp: 30 tablet, Rfl: 0    MEDICAL SUPPLY ITEM, Glucometer, Disp: 1 each, Rfl: 0    meloxicam (MOBIC) 15 MG tablet, Take 1 tablet (15 mg total) by mouth daily., Disp: , Rfl:     montelukast (SINGULAIR) 10 mg tablet, Take 1 tablet (10 mg total) by mouth nightly., Disp: , Rfl:     omeprazole (PRILOSEC) 20 MG capsule, Take 1 capsule (20 mg total) by mouth two (2) times a day., Disp: 180 capsule, Rfl: 1    ondansetron (ZOFRAN) 4 MG tablet, TAKE ONE TABLET BY MOUTH EVERY 8 HOURS AS NEEDED FOR NAUSEA AND VOMITING FOR UP TO 5 DAYS, Disp: , Rfl:     ondansetron (ZOFRAN-ODT) 4 MG disintegrating tablet, Take 1 tablet (4  mg total) by mouth every eight (8) hours as needed for nausea for up to 7 days., Disp: 14 tablet, Rfl: 0    oxybutynin (DITROPAN) 5 MG tablet, Take 1 tablet (5 mg total) by mouth two (2) times a day., Disp: , Rfl:     semaglutide (RYBELSUS) 3 mg Tab, Take 3 mg by mouth daily., Disp: , Rfl:     Allergies  Iodine and iodide containing products, Statins-hmg-coa reductase inhibitors, Sulfa (sulfonamide antibiotics), and Oxycodone    Family History  Family History   Problem Relation Age of Onset    Heart disease Father         Deacesed    Coronary artery disease Father         quadruple bypass    Mental illness Father     Asthma Father     COPD Father     Depression Father     Hypertension Father     Cancer Sister     Arthritis Daughter     Depression Daughter     Asthma Sister     Cancer Sister         Colan cancer deceased    COPD Sister     Depression Sister     Early death Sister         69 yrs old    Cancer Paternal Aunt         Thyroid cancer    Cancer Maternal Grandmother         Brain cancer    Depression Mother     Diabetes Mother     Hypertension Mother     Diabetes Brother     Vision loss Maternal Aunt         Blind at birth    Substance Abuse Disorder Neg Hx     Alcohol abuse Neg Hx     Drug abuse Neg Hx        Social History  Social History     Tobacco Use    Smoking status: Every Day     Current packs/day: 0.00     Types: Cigarettes     Start date: 05/01/1994     Last attempt to quit: 05/02/2019     Years since quitting: 4.3    Smokeless tobacco: Never    Tobacco comments:     Pt smokes  1.5cpd. Pt expressed desire to quit    Vaping Use    Vaping status: Never Used   Substance Use Topics    Alcohol use: Yes    Drug use: Not Currently     Comment: not used cocaine in 13 years        Physical Exam     VITAL SIGNS:      Vitals:    09/17/23 1801 09/17/23 2001   BP: 142/97 127/60   Pulse: 74 67   Resp: 18 16   Temp: 36.8 ??C (98.2 ??F) 36.8 ??C (98.2 ??F)   TempSrc: Oral Oral   SpO2: 96% 99%       Constitutional: Alert and oriented. No acute distress.  Eyes: Conjunctivae are normal.  HEENT: Normocephalic and atraumatic. Conjunctivae clear. No congestion. Moist mucous membranes.   Cardiovascular: Rate as above, regular rhythm. Normal and symmetric distal pulses. Brisk capillary refill. Normal skin turgor.  Respiratory: Normal respiratory effort. Breath sounds are normal. There are no wheezing or crackles heard.  Gastrointestinal: Soft, non-distended, non-tender.  Genitourinary: Deferred.  Musculoskeletal: Non-tender with normal range of motion  in all extremities.  Neurologic: Normal speech and language. No gross focal neurologic deficits are appreciated. Patient is moving all extremities equally, face is symmetric at rest and with speech.  Skin: Skin is warm, dry and intact. No rash noted.  Psychiatric: Mood and affect are normal. Speech and behavior are normal.     Radiology     CT Head Wo Contrast   Final Result      No acute appearing intracranial abnormality.           Pertinent labs & imaging results that were available during my care of the patient were independently interpreted by me and considered in my medical decision making (see chart for details).    Portions of this record have been created using Scientist, clinical (histocompatibility and immunogenetics). Dictation errors have been sought, but may not have been identified and corrected.

## 2023-09-18 NOTE — Unmapped (Signed)
Patient presents after fall on Friday and hitting head on cement.   Note hematoma to right temple and eye.   Denies LOC/thinners.  Pt notes had CT scan the day of but was neg.   States she has been getting worse, starting to throw up, dizzy, blurry vision.   Pupils are equal, reactive and tracking.   Pt reports she's usually on 3L at home.

## 2024-04-29 ENCOUNTER — Other Ambulatory Visit: Payer: Self-pay | Admitting: Gerontology

## 2024-04-29 DIAGNOSIS — Z1231 Encounter for screening mammogram for malignant neoplasm of breast: Secondary | ICD-10-CM

## 2024-04-29 MED ORDER — REPATHA SYRINGE 140 MG/ML SUBCUTANEOUS SYRINGE
SUBCUTANEOUS | 3 refills | 84.00000 days
Start: 2024-04-29 — End: ?

## 2024-05-11 MED FILL — REPATHA SYRINGE 140 MG/ML SUBCUTANEOUS SYRINGE: SUBCUTANEOUS | 84 days supply | Qty: 6 | Fill #0

## 2024-05-12 ENCOUNTER — Emergency Department
Admit: 2024-05-12 | Discharge: 2024-05-13 | Disposition: A | Discharge status: Home / Self Care | Payer: Medicare (Managed Care)

## 2024-05-12 DIAGNOSIS — G8929 Other chronic pain: Principal | ICD-10-CM

## 2024-05-12 DIAGNOSIS — M545 Acute exacerbation of chronic low back pain: Principal | ICD-10-CM

## 2024-05-12 NOTE — Unmapped (Signed)
 C/o throbbing pain in lower back radiating down her right hip and legs. Was sent here for a possible trigger point injection.

## 2024-05-13 MED ADMIN — lidocaine (PF) (XYLOCAINE-MPF) 10 mg/mL (1 %) injection 5 mL: 5 mL | @ 03:00:00 | Stop: 2024-05-12

## 2024-05-13 NOTE — Unmapped (Signed)
 [facility]  Emergency Department Provider Note     ED Clinical Impression     Final diagnoses:   Acute exacerbation of chronic low back pain (Primary)      Impression, Medical Decision Making, ED Course     Impression: 68 y.o. female with PMH as stated below presenting to the emergency department for evaluation of acute on chronic exacerbation of low back pain.  Patient reports that she receives ultrasound-guided injections and is due for follow-up appointment in mid August.      DDx/MDM:     Cauda equina considered but unlikely given patient's presentation.  No concerning signs for epidural abscess or spinal metastasis.    Patient has no complaints of urinary retention, saddle anesthesia.  She has had no numbness or weakness.  She has no history of immunocompromise or active cancer.  Her neurologic exam is unremarkable.  She is able to ambulate and does appear in pain.  She does have 2+ reflexes throughout, equal strength and sensation throughout.  She is had no fevers or chills.  She has no midline spinal tenderness on exam.      Local trigger point injections performed without complication.  No further workup were pursued emergency department this time, patient is hemodynamic stable and appropriate for discharge.    Diagnostic workup as below.     No orders of the defined types were placed in this encounter.       Procedures     MDM Elements  Discussion of Management with other Physicians, QHP or Appropriate Source: None       History     Chief Complaint  Chief Complaint   Patient presents with    Back Pain     History of Present Illness  Marie Quinn is a 68 year old female who presents with severe back pain.    She experiences severe back pain, rated as 18 on a scale of 1 to 20, primarily located on the right side. The pain has been persistent despite previous treatments.    She has been receiving ultrasound-guided injections for the past two years, which have provided significant relief, allowing her to go up to eight months without pain. Her next scheduled injection is not until May 26, 2024, and she is seeking a trigger point injection to manage her current pain.    Her current medication regimen includes muscle relaxers taken three times daily and meloxicam , which was recently increased to 15 mg. She also takes excessive amounts of Tylenol , up to six tablets at a time, to manage the pain.          Past Medical History[1]    Past Surgical History[2]    Active Medications[3]     Allergies[4]    Family History[5]    Short Social History[6]     Physical Exam     VITAL SIGNS:      Vitals:    05/12/24 1652 05/12/24 2030 05/13/24 0000   BP: 109/62 120/73 119/64   Pulse: 79 71 64   Resp: 18 16 18    Temp: 36.4 ??C (97.6 ??F) 36.7 ??C (98 ??F)    TempSrc: Temporal Oral    SpO2: 94% 97% 97%       Constitutional: Alert and oriented. No acute distress.  Eyes: Conjunctivae are normal.  HEENT: Normocephalic and atraumatic. Conjunctivae clear. No congestion. Moist mucous membranes.   Cardiovascular: Rate as above, regular rhythm. Normal and symmetric distal pulses. Brisk capillary refill. Normal skin turgor.  Respiratory: Normal respiratory effort. Breath sounds are normal. There are no wheezing or crackles heard.  Gastrointestinal: Soft, non-distended, non-tender.  Genitourinary: Deferred.  Musculoskeletal: Non-tender with normal range of motion in all extremities.  Neurologic: Normal speech and language. No gross focal neurologic deficits are appreciated. Patient is moving all extremities equally, face is symmetric at rest and with speech.  Skin: Skin is warm, dry and intact. No rash noted.  Psychiatric: Mood and affect are normal. Speech and behavior are normal.     Radiology     No orders to display       Pertinent labs & imaging results that were available during my care of the patient were independently interpreted by me and considered in my medical decision making (see chart for details).    Portions of this record have been created using Scientist, clinical (histocompatibility and immunogenetics). Dictation errors have been sought, but may not have been identified and corrected.           [1]   Past Medical History:  Diagnosis Date    Achilles tendinitis, right leg 2021    Anxiety     Arthritis     Adult life    Asthma (HHS-HCC)     BPPV (benign paroxysmal positional vertigo)     Chronic diastolic CHF (congestive heart failure)       COPD (chronic obstructive pulmonary disease)       Depression     Fibromyalgia     GERD (gastroesophageal reflux disease)     Adult life    Heart disease     Heart murmur     Hyperlipidemia     Hypertension     Myocardial infarction   2008    s/p stent placement x 1 at [facility]    Obesity     Panic disorder     Substance abuse (CMS-HCC)     Type 2 diabetes mellitus, without long-term current use of insulin    12/19/2022    Urge incontinence    [2]   Past Surgical History:  Procedure Laterality Date    cardiac stent x 1      CHOLECYSTECTOMY      HYSTERECTOMY  1990    one ovary remains    PR COLORECTAL SCRN; HI RISK IND N/A 08/06/2019    Procedure: COLOREC CANCR SCR; COLNSCPY;  Surgeon: Myrick Izella Keels, MD;  [facility];  Service: Gastroenterology    PR ERCP REMOVE FOREIGN BODY/STENT BILIARY/PANC DUCT N/A 08/09/2019    Procedure: ENDOSCOPIC RETROGRADE CHOLANGIOPANCREATOGRAPHY (ERCP); W/ REMOVAL OF FOREIGN BODY/STENT FROM BILIARY/PANCREATIC DUCT(S);  Surgeon: Krystal Jona Matter, MD;  [facility];  Service: Gastroenterology    PR ERCP STENT PLACEMENT BILIARY/PANCREATIC DUCT N/A 06/28/2019    Procedure: ENDOSCOPIC RETROGRADE CHOLANGIOPANCREATOGRAPHY (ERCP); WITH PLACEMENT OF ENDOSCOPIC STENT INTO BILIARY OR PANCREATIC DUCT;  Surgeon: Krystal Jona Matter, MD;  Location[facility];  Service: Gastroenterology    PR ERCP,SPHINCTEROTOMY N/A 11/12/2018    Procedure: ERCP; W/SPHINCTEROTOMY/PAPILLOTOMY;  Surgeon: Krystal Jona Matter, MD;  [facility];  Service: Gastroenterology    PR ERCP,W/REMOVAL STONE,BIL/PANCR DUCTS N/A 11/12/2018    Procedure: ERCP; W/ENDOSCOPIC RETROGRADE REMOVAL OF CALCULUS/CALCULI FROM BILIARY &/OR PANCREATIC DUCTS;  Surgeon: Krystal Jona Matter, MD;  [facility];  Service: Gastroenterology    TONSILLECTOMY      TUBAL LIGATION  1990    Tubal pregnacy   [3]   No current facility-administered medications for this encounter.  Current Outpatient Medications   Medication Sig Dispense Refill    albuterol  2.5 mg /3 mL (0.083 %) nebulizer solution Inhale the contents of 1 vial (2.5 mg total) by nebulization every four (4) hours as needed for wheezing. 90 mL 1    amlodipine  (NORVASC ) 10 MG tablet Take 1 tablet (10 mg total) by mouth daily. 90 tablet 2    aspirin  (ECOTRIN) 81 MG tablet Take 1 tablet (81 mg total) by mouth daily.      buPROPion  (WELLBUTRIN  XL) 300 MG 24 hr tablet Take 1 tablet (300 mg total) by mouth daily.      cholecalciferol, vitamin D3-50 mcg, 2,000 unit,, (VITAMIN D3) 50 mcg (2,000 unit) cap Take 3 capsules (150 mcg total) by mouth daily.      DULoxetine  (CYMBALTA ) 20 MG capsule Take 1 capsule (20 mg total) by mouth daily. 30 capsule 2    evolocumab  (REPATHA  SYRINGE) 140 mg/mL Syrg Inject the contents of 1 syringe (140 mg) under the skin every fourteen (14) days. 6 mL 3    evolocumab  (REPATHA  SYRINGE) 140 mg/mL Syrg Inject 140 mg under the skin every fourteen (14) days. 6 mL 3    hydrOXYzine  (ATARAX ) 25 MG tablet Take 1 Tablet by mouth every 6 hours as needed for anxiety and congestion 30 tablet 0    lamoTRIgine  (LAMICTAL ) 100 MG tablet Take 125 mg by mouth two (2) times a day.      lamoTRIgine  (LAMICTAL ) 25 MG tablet Take 1 tablet (25 mg total) by mouth two (2) times a day.      losartan  (COZAAR ) 100 MG tablet Take 1 tablet (100 mg total) by mouth daily. 90 tablet 1    meclizine  (ANTIVERT ) 25 mg tablet Take 1 tablet (25 mg total) by mouth Three (3) times a day as needed for dizziness. 30 tablet 0    MEDICAL SUPPLY ITEM Glucometer 1 each 0    meloxicam  (MOBIC ) 15 MG tablet Take 1 tablet (15 mg total) by mouth daily.      montelukast  (SINGULAIR ) 10 mg tablet Take 1 tablet (10 mg total) by mouth nightly.      omeprazole  (PRILOSEC) 20 MG capsule Take 1 capsule (20 mg total) by mouth two (2) times a day. 180 capsule 1    ondansetron  (ZOFRAN ) 4 MG tablet TAKE ONE TABLET BY MOUTH EVERY 8 HOURS AS NEEDED FOR NAUSEA AND VOMITING FOR UP TO 5 DAYS      oxybutynin (DITROPAN) 5 MG tablet Take 1 tablet (5 mg total) by mouth two (2) times a day.      semaglutide (RYBELSUS) 3 mg Tab Take 3 mg by mouth daily.     [4]   Allergies  Allergen Reactions    Iodine And Iodide Containing Products Anaphylaxis    Statins-Hmg-Coa Reductase Inhibitors      Side effects    Sulfa (Sulfonamide Antibiotics)     Oxycodone  Nausea Only     Makes her feel weird   [5]   Family History  Problem Relation Age of Onset    Heart disease Father         Deacesed    Coronary artery disease Father         quadruple bypass    Mental illness Father     Asthma Father     COPD Father     Depression Father     Hypertension Father     Cancer Sister     Arthritis Daughter     Depression  Daughter     Asthma Sister     Cancer Sister         Colan cancer deceased    COPD Sister     Depression Sister     Early death Sister         70 yrs old    Cancer Paternal Aunt         Thyroid cancer    Cancer Maternal Grandmother         Brain cancer    Depression Mother     Diabetes Mother     Hypertension Mother     Diabetes Brother     Vision loss Maternal Aunt         Blind at birth    Substance Abuse Disorder Neg Hx     Alcohol abuse Neg Hx     Drug abuse Neg Hx    [6]   Social History  Tobacco Use    Smoking status: Every Day     Current packs/day: 0.00     Types: Cigarettes     Start date: 05/01/1994     Last attempt to quit: 05/02/2019     Years since quitting: 5.0    Smokeless tobacco: Never    Tobacco comments:     Pt smokes  1.5cpd. Pt expressed desire to quit Vaping Use    Vaping status: Never Used   Substance Use Topics    Alcohol use: Yes    Drug use: Not Currently     Comment: not used cocaine in 13 years        Marsa Mabel Barrio, [place]  05/13/24 0013

## 2024-08-17 MED FILL — REPATHA SYRINGE 140 MG/ML SUBCUTANEOUS SYRINGE: SUBCUTANEOUS | 28 days supply | Qty: 2 | Fill #1
